# Patient Record
Sex: Female | Born: 1990
Health system: Southern US, Community
[De-identification: ages and names within clinical notes are randomized; demographics above are authoritative.]

## PROBLEM LIST (undated history)

## (undated) DIAGNOSIS — Z789 Other specified health status: Secondary | ICD-10-CM

## (undated) DIAGNOSIS — E282 Polycystic ovarian syndrome: Secondary | ICD-10-CM

## (undated) HISTORY — PX: NO PAST SURGERIES: SHX2092

## (undated) HISTORY — DX: Polycystic ovarian syndrome: E28.2

---

## 2013-10-15 ENCOUNTER — Encounter (HOSPITAL_COMMUNITY): Payer: Self-pay | Admitting: Emergency Medicine

## 2013-10-15 ENCOUNTER — Emergency Department (INDEPENDENT_AMBULATORY_CARE_PROVIDER_SITE_OTHER)
Admission: EM | Admit: 2013-10-15 | Discharge: 2013-10-15 | Disposition: A | Discharge status: Home / Self Care | Payer: Medicaid Other | Source: Home / Self Care | Attending: Emergency Medicine | Admitting: Emergency Medicine

## 2013-10-15 DIAGNOSIS — K044 Acute apical periodontitis of pulpal origin: Secondary | ICD-10-CM

## 2013-10-15 DIAGNOSIS — J111 Influenza due to unidentified influenza virus with other respiratory manifestations: Secondary | ICD-10-CM

## 2013-10-15 DIAGNOSIS — R69 Illness, unspecified: Secondary | ICD-10-CM

## 2013-10-15 DIAGNOSIS — K047 Periapical abscess without sinus: Secondary | ICD-10-CM

## 2013-10-15 MED ORDER — BENZONATATE 200 MG PO CAPS: 200.0000 mg | ORAL_CAPSULE | Freq: Three times a day (TID) | ORAL | Status: DC | PRN

## 2013-10-15 MED ORDER — TRAMADOL HCL 50 MG PO TABS: 100.0000 mg | ORAL_TABLET | Freq: Three times a day (TID) | ORAL | Status: DC | PRN

## 2013-10-15 MED ORDER — IPRATROPIUM BROMIDE 0.06 % NA SOLN: 2.0000 | Freq: Four times a day (QID) | NASAL | Status: DC

## 2013-10-15 MED ORDER — AMOXICILLIN 500 MG PO CAPS: 500.0000 mg | ORAL_CAPSULE | Freq: Three times a day (TID) | ORAL | Status: DC

## 2013-10-15 NOTE — ED Notes (Signed)
Patient complains of fever/chills, chest congestion with cough and general malaise also lower right tooth ache.

## 2013-10-15 NOTE — ED Provider Notes (Signed)
Chief Complaint   Chief Complaint  Patient presents with  . URI  . Dental Pain    History of Present Illness   Megan [name] is a 23 year old female who presents with her 2 small children. She is from [place] by way of [place]. Is only been in [place] for about 15 days. Her complaints today consist of a two-day history of cough, myalgias, fatigue, subjective fever, sore throat, nasal congestion, and headache. She denies vomiting or diarrhea. She also has had a three-day history of a painful right, lower first molar.  Review of Systems   Other than as noted above, the patient denies any of the following symptoms: Systemic:  No fevers, chills, sweats, or myalgias. Eye:  No redness or discharge. ENT:  No ear pain, headache, nasal congestion, drainage, sinus pressure, or sore throat. Neck:  No neck pain, stiffness, or swollen glands. Lungs:  No cough, sputum production, hemoptysis, wheezing, chest tightness, shortness of breath or chest pain. GI:  No abdominal pain, nausea, vomiting or diarrhea.  PMFSH   Past medical history, family history, social history, meds, and allergies were reviewed.   Physical exam   Vital signs:  BP 120/75  Pulse 85  Temp(Src) 99.2 F (37.3 C) (Oral)  Resp 18  SpO2 100% General:  Alert and oriented.  In no distress.  Skin warm and dry. Eye:  No conjunctival injection or drainage. Lids were normal. ENT:  TMs and canals were normal, without erythema or inflammation.  Nasal mucosa was clear and uncongested, without drainage.  Mucous membranes were moist.  Pharynx was clear with no exudate or drainage.  There were no oral ulcerations or lesions. Her right, lower first molar was decayed. No swelling of the gingiva or the floor the mouth. Neck:  Supple, no adenopathy, tenderness or mass. Lungs:  No respiratory distress.  Lungs were clear to auscultation, without wheezes, rales or rhonchi.  Breath sounds were clear and equal bilaterally.  Heart:  Regular  rhythm, without gallops, murmers or rubs. Skin:  Clear, warm, and dry, without rash or lesions.  Assessment     The primary encounter diagnosis was Dental infection. A diagnosis of Influenza-like illness was also pertinent to this visit.  Plan    1.  Meds:  The following meds were prescribed:   Discharge Medication List as of 10/15/2013  4:46 PM    START taking these medications   Details  amoxicillin (AMOXIL) 500 MG capsule Take 1 capsule (500 mg total) by mouth 3 (three) times daily., Starting 10/15/2013, Until Discontinued, Normal    benzonatate (TESSALON) 200 MG capsule Take 1 capsule (200 mg total) by mouth 3 (three) times daily as needed for cough., Starting 10/15/2013, Until Discontinued, Normal    ipratropium (ATROVENT) 0.06 % nasal spray Place 2 sprays into both nostrils 4 (four) times daily., Starting 10/15/2013, Until Discontinued, Normal    traMADol (ULTRAM) 50 MG tablet Take 2 tablets (100 mg total) by mouth every 8 (eight) hours as needed., Starting 10/15/2013, Until Discontinued, Normal        2.  Patient Education/Counseling:  The patient was given appropriate handouts, self care instructions, and instructed in symptomatic relief.  Instructed to get extra fluids, rest, and use a cool mist vaporizer.    3.  Follow up:  The patient was told to follow up here if no better in 3 to 4 days, or sooner if becoming worse in any way, and given some red flag symptoms such as increasing fever, difficulty  breathing, chest pain, or persistent vomiting which would prompt immediate return.  Follow up with a dentist as soon as possible.      Reuben Likes, MD 10/15/13 [id number]

## 2013-10-15 NOTE — Discharge Instructions (Signed)
Most upper respiratory infections are caused by viruses and do not require antibiotics.  We try to save the antibiotics for when we really need them to prevent bacteria from developing resistance to them.  Here are a few hints about things that can be done at home to help get over an upper respiratory infection quicker:  Get extra sleep and extra fluids.  Get 7 to 9 hours of sleep per night and 6 to 8 glasses of water a day.  Getting extra sleep keeps the immune system from getting run down.  Most people with an upper respiratory infection are a little dehydrated.  The extra fluids also keep the secretions liquified and easier to deal with.  Also, get extra vitamin C.  4000 mg per day is the recommended dose. For the aches, headache, and fever, acetaminophen or ibuprofen are helpful.  These can be alternated every 4 hours.  People with liver disease should avoid large amounts of acetaminophen, and people with ulcer disease, gastroesophageal reflux, gastritis, congestive heart failure, chronic kidney disease, coronary artery disease and the elderly should avoid ibuprofen. For nasal congestion try Mucinex-D, or if you're having lots of sneezing or clear nasal drainage use Zyrtec-D. People with high blood pressure can take these if their blood pressure is controlled, if not, it's best to avoid the forms with a "D" (decongestants).  You can use the plain Mucinex, Allegra, Claritin, or Zyrtec even if your blood pressure is not controlled.   A Saline nasal spray such as Ocean Spray can also help.  You can add a decongestant sprays such as Afrin, but you should not use the decongestant sprays for more than 3 or 4 days since they can be habituating.  Breathe Rite nasal strips can also offer a non-drug alternative treatment to nasal congestion, especially at night. For people with symptoms of sinusitis, sleeping with your head elevated can be helpful.  For sinus pain, moist, hot compresses to the face may provide some  relief.  Many people find that inhaling steam as in a shower or from a pot of steaming water can help. For any viral infection, zinc containing lozenges such as Cold-Eze or Zicam are helpful.  Zinc helps to fight viral infection.  Hot salt water gargles (8 oz of hot water, 1/2 tsp of table salt, and a pinch of baking soda) can give relief as well as hot beverages such as hot tea.  Sucrets extra strength lozenges will help the sore throat.  For the cough, take Delsym 2 tsp every 12 hours.  It has also been found recently that Aleve can help control a cough.  The dose is 1 to 2 tablets twice daily with food.  This can be combined with Delsym. (Note, if you are taking ibuprofen, you should not take Aleve as well--take one or the other.) A cool mist vaporizer will help keep your mucous membranes from drying out.   It's important when you have an upper respiratory infection not to pass the infection to others.  This involves being very careful about the following:  Frequent hand washing or use of hand sanitizer, especially after coughing, sneezing, blowing your nose or touching your face, nose or eyes. Do not shake hands or touch anyone and try to avoid touching surfaces that other people use such as doorknobs, shopping carts, telephones and computer keyboards. Use tissues and dispose of them properly in a garbage can or ziplock bag. Cough into your sleeve. Do not let others eat or  drink after you. ° °It's also important to recognize the signs of serious illness and get evaluated if they occur: °Any respiratory infection that lasts more than 7 to 10 days.  Yellow nasal drainage and sputum are not reliable indicators of a bacterial infection, but if they last for more than 1 week, see your doctor. °Fever and sore throat can indicate strep. °Fever and cough can indicate influenza or pneumonia. °Any kind of severe symptom such as difficulty breathing, intractable vomiting, or severe pain should prompt you to see  a doctor as soon as possible. ° ° °Your body's immune system is really the thing that will get rid of this infection.  Your immune system is comprised of 2 types of specialized cells called T cells and B cells.  T cells coordinate the array of cells in your body that engulf invading bacteria or viruses while B cells orchestrate the production of antibodies that neutralize infection.  Anything we do or any medications we give you, will just strengthen your immune system or help it clear up the infection quicker.  Here are a few helpful hints to improve your immune system to help overcome this illness or to prevent future infections: °· A few vitamins can improve the health of your immune system.  That's why your diet should include plenty of fruits, vegetables, fish, nuts, and whole grains. °· Vitamin A and bet-carotene can increase the cells that fight infections (T cells and B cells).  Vitamin A is abundant in dark greens and orange vegetables such as spinach, greens, sweet potatoes, and carrots. °· Vitamin B6 contributes to the maturation of white blood cells, the cells that fight disease.  Foods with vitamin B6 include cold cereal and bananas. °· Vitamin C is credited with preventing colds because it increases white blood cells and also prevents cellular damage.  Citrus fruits, peaches and green and red bell peppers are all hight in vitamin C. °· Vitamin E is an anti-oxidant that encourages the production of natural killer cells which reject foreign invaders and B cells that produce antibodies.  Foods high in vitamin E include wheat germ, nuts and seeds. °· Foods high in omega-3 fatty acids found in foods like salmon, tuna and mackerel boost your immune system and help cells to engulf and absorb germs. °· Probiotics are good bacteria that increase your T cells.  These can be found in yogurt and are available in supplements such as Culturelle or Align. °· Moderate exercise increases the strength of your immune  system and your ability to recover from illness.  I suggest 3 to 5 moderate intensity 30 minute workouts per week.   °· Sleep is another component of maintaining a strong immune system.  It enables your body to recuperate from the day's activities, stress and work.  My recommendation is to get between 7 and 9 hours of sleep per night. °· If you smoke, try to quit completely or at least cut down.  Drink alcohol only in moderation if at all.  No more than 2 drinks daily for men or 1 for women. °· Get a flu vaccine early in the fall or if you have not gotten one yet, once this illness has run its course.  If you are over 65, a smoker, or an asthmatic, get a pneumococcal vaccine. °· My final recommendation is to maintain a healthy weight.  Excess weight can impair the immune system by interfering with the way the immune system deals with invading viruses or   bacteria.   Look up the [facility]'s [facility] for free dental clinics. https://www.williams-garcia.biz/.asp  Get there early and be prepared to wait. [employer] and [employer] have Armed forces operational officer schools that provide low cost routine dental care.   Other resources: [facility] [address] [place], [place] [phone]  Patients with Medicaid: [facility] [phone]                                [phone] [address] Phone:  [phone]                                                  Phone:  [phone]  If unable to pay or uninsured, contact:  [facility] or [facility]. to become qualified for the adult dental clinic.  No matter what dental problem you have, it will not get better unless you get good dental care.  If the tooth is not taken care of, your symptoms will come back in time and you will be visiting [place] again in the Urgent Care Center with a bad toothache.  So, see your dentist as soon as possible.  If  you don't have a dentist, we can give you a list of dentists.  Sometimes the most cost effective treatment is removal of the tooth.  This can be done very inexpensively through one of the low cost [employer], broadcasting/film/video such as the facility on [facility] in [place] [phone]).  The downside to this is that you will have one less tooth and this can effect your ability to chew.  Some other things that can be done for a dental infection include the following:   Rinse your mouth out with hot salt water (1/2 tsp of table salt and a pinch of baking soda in 8 oz of hot water).  You can do this every 2 or 3 hours.  Avoid cold foods, beverages, and cold air.  This will make your symptoms worse.  Sleep with your head elevated.  Sleeping flat will cause your gums and oral tissues to swell and make them hurt more.  You can sleep on several pillows.  Even better is to sleep in a recliner with your head higher than your heart.  For mild to moderate pain, you can take Tylenol, ibuprofen, or Aleve.  External application of heat by a heating pad, hot water bottle, or hot wet towel can help with pain and speed healing.  You can do this every 2 to 3 hours. Do not fall asleep on a heating pad since this can cause a burn.

## 2013-12-21 ENCOUNTER — Emergency Department (HOSPITAL_COMMUNITY)
Admission: EM | Admit: 2013-12-21 | Discharge: 2013-12-22 | Disposition: A | Discharge status: Home / Self Care | Payer: Medicaid Other | Attending: Emergency Medicine | Admitting: Emergency Medicine

## 2013-12-21 ENCOUNTER — Encounter (HOSPITAL_COMMUNITY): Payer: Self-pay | Admitting: Emergency Medicine

## 2013-12-21 DIAGNOSIS — Z79899 Other long term (current) drug therapy: Secondary | ICD-10-CM | POA: Insufficient documentation

## 2013-12-21 DIAGNOSIS — R42 Dizziness and giddiness: Secondary | ICD-10-CM

## 2013-12-21 DIAGNOSIS — Z3202 Encounter for pregnancy test, result negative: Secondary | ICD-10-CM | POA: Insufficient documentation

## 2013-12-21 DIAGNOSIS — Z792 Long term (current) use of antibiotics: Secondary | ICD-10-CM | POA: Insufficient documentation

## 2013-12-21 LAB — CBC WITH DIFFERENTIAL/PLATELET
Basophils Absolute: 0.1 10*3/uL (ref 0.0–0.1)
Basophils Relative: 1 % (ref 0–1)
Eosinophils Absolute: 0.3 10*3/uL (ref 0.0–0.7)
Eosinophils Relative: 2 % (ref 0–5)
HCT: 39.4 % (ref 36.0–46.0)
Hemoglobin: 13.3 g/dL (ref 12.0–15.0)
Lymphocytes Relative: 20 % (ref 12–46)
Lymphs Abs: 3.1 10*3/uL (ref 0.7–4.0)
MCH: 27.2 pg (ref 26.0–34.0)
MCHC: 33.8 g/dL (ref 30.0–36.0)
MCV: 80.6 fL (ref 78.0–100.0)
Monocytes Absolute: 1 10*3/uL (ref 0.1–1.0)
Monocytes Relative: 6 % (ref 3–12)
Neutro Abs: 10.7 10*3/uL — ABNORMAL HIGH (ref 1.7–7.7)
Neutrophils Relative %: 71 % (ref 43–77)
Platelets: 356 10*3/uL (ref 150–400)
RBC: 4.89 MIL/uL (ref 3.87–5.11)
RDW: 13.1 % (ref 11.5–15.5)
WBC: 15.1 10*3/uL — ABNORMAL HIGH (ref 4.0–10.5)

## 2013-12-21 LAB — URINALYSIS, ROUTINE W REFLEX MICROSCOPIC
Bilirubin Urine: NEGATIVE
Glucose, UA: NEGATIVE mg/dL
Hgb urine dipstick: NEGATIVE
Ketones, ur: NEGATIVE mg/dL
Leukocytes, UA: NEGATIVE
Nitrite: NEGATIVE
Protein, ur: NEGATIVE mg/dL
Specific Gravity, Urine: 1.02 (ref 1.005–1.030)
Urobilinogen, UA: 0.2 mg/dL (ref 0.0–1.0)
pH: 6.5 (ref 5.0–8.0)

## 2013-12-21 LAB — BASIC METABOLIC PANEL
BUN: 10 mg/dL (ref 6–23)
CO2: 21 mEq/L (ref 19–32)
Calcium: 9.4 mg/dL (ref 8.4–10.5)
Chloride: 102 mEq/L (ref 96–112)
Creatinine, Ser: 0.43 mg/dL — ABNORMAL LOW (ref 0.50–1.10)
GFR calc Af Amer: >90 mL/min (ref >90)
GFR calc non Af Amer: >90 mL/min (ref >90)
Glucose, Bld: 103 mg/dL — ABNORMAL HIGH (ref 70–99)
Potassium: 3.5 mEq/L — ABNORMAL LOW (ref 3.7–5.3)
Sodium: 137 mEq/L (ref 137–147)

## 2013-12-21 LAB — PREGNANCY, URINE: Preg Test, Ur: NEGATIVE

## 2013-12-21 MED ORDER — POTASSIUM CHLORIDE 10 MEQ/100ML IV SOLN
10.0000 meq | Freq: Once | INTRAVENOUS | Status: AC
  Administered 2013-12-21: 10 meq via INTRAVENOUS
  Filled 2013-12-21: qty 100

## 2013-12-21 MED ORDER — SODIUM CHLORIDE 0.9 % IV BOLUS (SEPSIS)
2000.0000 mL | Freq: Once | INTRAVENOUS | Status: AC
  Administered 2013-12-21: 2000 mL via INTRAVENOUS

## 2013-12-21 MED ORDER — PROMETHAZINE HCL 25 MG/ML IJ SOLN
12.5000 mg | Freq: Once | INTRAMUSCULAR | Status: AC
  Administered 2013-12-21: 12.5 mg via INTRAVENOUS
  Filled 2013-12-21: qty 1

## 2013-12-21 NOTE — ED Notes (Signed)
Patient denies pain at this time states she is just dizzy and having nausea

## 2013-12-21 NOTE — ED Notes (Signed)
Bed: XH37 Expected date: 12/21/13 Expected time: 7:53 PM Means of arrival: Ambulance Comments: migraine

## 2013-12-21 NOTE — ED Provider Notes (Signed)
CSN: [id number]     Arrival date & time 12/21/13  2005 History   First MD Initiated Contact with Patient 12/21/13 2008     Chief Complaint  Patient presents with  . Migraine     (Consider location/radiation/quality/duration/timing/severity/associated sxs/prior Treatment) HPI Patient presents to the emergency department with dizziness that started around 4 PM today.  The patient, states she's not had a headache all day.  She did have a headache earlier, but it went away.  Patient, states, that the room was spinning, she did take tramadol for her headache seemed to make her dizzy.  The patient, states, that she did not have any chest pain, shortness breath, blurred vision, weakness, back pain, abdominal pain, dysuria, fever, cough, rhinorrhea, sore throat, rash, or syncope patient did not take any other medications prior to arrival. History reviewed. No pertinent past medical history. History reviewed. No pertinent past surgical history. No family history on file. History  Substance Use Topics  . Smoking status: Never Smoker   . Smokeless tobacco: Not on file  . Alcohol Use: No   OB History   Grav Para Term Preterm Abortions TAB SAB Ect Mult Living                 Review of Systems All other systems negative except as documented in the HPI. All pertinent positives and negatives as reviewed in the HPI.   Allergies  Review of patient's allergies indicates no known allergies.  Home Medications   Prior to Admission medications   Medication Sig Start Date End Date Taking? Authorizing Provider  amoxicillin (AMOXIL) 500 MG capsule Take 1 capsule (500 mg total) by mouth 3 (three) times daily. 10/15/13   Reuben Likes, MD  benzonatate (TESSALON) 200 MG capsule Take 1 capsule (200 mg total) by mouth 3 (three) times daily as needed for cough. 10/15/13   Reuben Likes, MD  ipratropium (ATROVENT) 0.06 % nasal spray Place 2 sprays into both nostrils 4 (four) times daily. 10/15/13   Reuben Likes, MD  traMADol (ULTRAM) 50 MG tablet Take 2 tablets (100 mg total) by mouth every 8 (eight) hours as needed. 10/15/13   Reuben Likes, MD   BP 129/80  Pulse 78  Temp(Src) 99.1 F (37.3 C) (Oral)  Resp 18  SpO2 100% Physical Exam  Nursing note and vitals reviewed. Constitutional: She is oriented to person, place, and time. Vital signs are normal. She appears well-developed and well-nourished. She appears ill.  HENT:  Head: Normocephalic and atraumatic.  Mouth/Throat: Oropharynx is clear and moist.  Eyes: Pupils are equal, round, and reactive to light.  Neck: Normal range of motion. Neck supple.  Cardiovascular: Normal rate, regular rhythm and normal heart sounds.  Exam reveals no gallop and no friction rub.   No murmur heard. Pulmonary/Chest: Effort normal and breath sounds normal. No respiratory distress. She has no wheezes.  Musculoskeletal: She exhibits no edema.  Neurological: She is alert and oriented to person, place, and time. She exhibits normal muscle tone. Coordination normal.  Skin: Skin is warm and dry. No rash noted.  Psychiatric: She has a normal mood and affect. Her behavior is normal. Judgment and thought content normal.    ED Course  Procedures (including critical care time)    Patient's been rechecked x3 just ambulated.  The patient at 1 AM and she felt.  No dizziness, or room spinning.  Patient said she felt okay the history was obtained through the translator line.  Patient be best return here as needed.  Her vital signs and stable.  Packs.  She is feeling better.  This is most likely related to possible medication and migraine headache.  She could have vertigo as well.  Told to increase her fluid intake, and rest as much possible  Carlyle Dolly, PA-C 12/22/13 0106

## 2013-12-21 NOTE — ED Notes (Signed)
Pt presents with c/o migraine that has been going on all day, hx of same. Pt does have tramadol for these migraines and she took one today and per neighbor, she got worse after taking the medicine. Pt has had some dry heaving en route but no vomiting.

## 2013-12-21 NOTE — ED Notes (Signed)
While attempting to stand patient up and walk her to the bathroom, pt was not able to take any steps forward and was very unsteady on her feet. Pt assisted back into the bed. Interpreter phone used for translation, and pt explained to interpreter that she was also feeling dizzy in addition to the migraine. Explained to pt through interpreter that we were going to assist her onto a bedpan to obtain a urine sample. Pt understands at this time.

## 2013-12-22 MED ORDER — MECLIZINE HCL 25 MG PO TABS: 25.0000 mg | ORAL_TABLET | Freq: Three times a day (TID) | ORAL | Status: DC | PRN

## 2013-12-22 MED ORDER — PROMETHAZINE HCL 25 MG PO TABS: 25.0000 mg | ORAL_TABLET | Freq: Three times a day (TID) | ORAL | Status: DC | PRN

## 2013-12-22 NOTE — Discharge Instructions (Signed)
Return here as needed.  Followup with the primary care Dr. Jaquita Folds your fluid intake

## 2013-12-22 NOTE — ED Notes (Signed)
Used interpreter phone to review d/c instructions with patient. Pt and family agreed that they understood the instructions and the prescriptions to be given to patient. Pt leaving via wheelchair with family at this time.

## 2013-12-22 NOTE — ED Provider Notes (Signed)
Medical screening examination/treatment/procedure(s) were performed by non-physician practitioner and as supervising physician I was immediately available for consultation/collaboration.   EKG Interpretation None        Dagmar Hait, MD 12/22/13 2328

## 2013-12-23 LAB — URINE CULTURE
Colony Count: NO GROWTH
Culture: NO GROWTH

## 2014-06-27 ENCOUNTER — Encounter: Payer: Self-pay | Admitting: Family Medicine

## 2014-06-27 ENCOUNTER — Other Ambulatory Visit: Payer: Self-pay

## 2014-06-27 ENCOUNTER — Ambulatory Visit: Payer: Medicaid Other | Attending: Family Medicine | Admitting: Family Medicine

## 2014-06-27 VITALS — BP 99/67 | HR 82 | Temp 98.3°F | Resp 18 | Ht 62.0 in | Wt 182.0 lb

## 2014-06-27 DIAGNOSIS — H538 Other visual disturbances: Secondary | ICD-10-CM | POA: Diagnosis not present

## 2014-06-27 DIAGNOSIS — I491 Atrial premature depolarization: Secondary | ICD-10-CM

## 2014-06-27 DIAGNOSIS — Z113 Encounter for screening for infections with a predominantly sexual mode of transmission: Secondary | ICD-10-CM

## 2014-06-27 DIAGNOSIS — M5412 Radiculopathy, cervical region: Secondary | ICD-10-CM | POA: Insufficient documentation

## 2014-06-27 DIAGNOSIS — R5383 Other fatigue: Secondary | ICD-10-CM | POA: Diagnosis not present

## 2014-06-27 DIAGNOSIS — M79642 Pain in left hand: Secondary | ICD-10-CM | POA: Insufficient documentation

## 2014-06-27 DIAGNOSIS — Z114 Encounter for screening for human immunodeficiency virus [HIV]: Secondary | ICD-10-CM | POA: Insufficient documentation

## 2014-06-27 DIAGNOSIS — E559 Vitamin D deficiency, unspecified: Secondary | ICD-10-CM

## 2014-06-27 HISTORY — DX: Atrial premature depolarization: I49.1

## 2014-06-27 LAB — COMPLETE METABOLIC PANEL WITH GFR
ALT: 20 U/L (ref 0–35)
AST: 21 U/L (ref 0–37)
Albumin: 4.1 g/dL (ref 3.5–5.2)
Alkaline Phosphatase: 52 U/L (ref 39–117)
BUN: 9 mg/dL (ref 6–23)
CO2: 27 meq/L (ref 19–32)
Calcium: 9.5 mg/dL (ref 8.4–10.5)
Chloride: 104 mEq/L (ref 96–112)
Creat: 0.64 mg/dL (ref 0.50–1.10)
GFR, Est Non African American: >89 mL/min
GLUCOSE: 76 mg/dL (ref 70–99)
POTASSIUM: 4.7 meq/L (ref 3.5–5.3)
SODIUM: 139 meq/L (ref 135–145)
Total Bilirubin: 0.3 mg/dL (ref 0.2–1.2)
Total Protein: 7.5 g/dL (ref 6.0–8.3)

## 2014-06-27 LAB — CBC
HCT: 40.5 % (ref 36.0–46.0)
HEMOGLOBIN: 13.6 g/dL (ref 12.0–15.0)
MCH: 27 pg (ref 26.0–34.0)
MCHC: 33.6 g/dL (ref 30.0–36.0)
MCV: 80.5 fL (ref 78.0–100.0)
MPV: 9.8 fL (ref 9.4–12.4)
PLATELETS: 345 10*3/uL (ref 150–400)
RBC: 5.03 MIL/uL (ref 3.87–5.11)
RDW: 13.6 % (ref 11.5–15.5)
WBC: 11.5 10*3/uL — ABNORMAL HIGH (ref 4.0–10.5)

## 2014-06-27 LAB — GLUCOSE, POCT (MANUAL RESULT ENTRY): POC GLUCOSE: 96 mg/dL (ref 70–99)

## 2014-06-27 MED ORDER — GABAPENTIN 300 MG PO CAPS: 300.0000 mg | ORAL_CAPSULE | Freq: Two times a day (BID) | ORAL | Status: DC | PRN

## 2014-06-27 MED ORDER — GABAPENTIN 300 MG PO CAPS: 300.0000 mg | ORAL_CAPSULE | Freq: Every day | ORAL | Status: DC

## 2014-06-27 NOTE — Assessment & Plan Note (Signed)
Decreased visual acuity Referral to optometry

## 2014-06-27 NOTE — Assessment & Plan Note (Addendum)
A: x 2 years, 2 young children P: CBC, CMP, TSH, vit D, vit B12 Regular sleep

## 2014-06-27 NOTE — Assessment & Plan Note (Signed)
A: in the setting of stress P:  Stress reduction Exercise and stretches  Gabapentin prn

## 2014-06-27 NOTE — Progress Notes (Signed)
Establish Care Complaining of irregular heart beat Stated has pain on lt hand x 1 wk no injury

## 2014-06-27 NOTE — Assessment & Plan Note (Signed)
A: noted on EKG in the setting of fatigue x 2 years P: Limit caffeine Regular sleep Stress reduction Check TSH, CBC, CMP

## 2014-06-27 NOTE — Patient Instructions (Signed)
Mrs. Delano,  Thank you for coming in today. It was a pleasure meeting you. I look forward to being your primary doctor.  1. For fatigue: Continue regular sleep  You will be called with lab results.  Stress reduction with regular exercise and stretching.   2. Palpittions: avoid caffeine.   3. L arm, hand and neck pain: stress reduction with exercise, rest. Gabapentin at night if needed for pain.  Return in 4 weeks for physical  Dr. Armen Pickup

## 2014-06-27 NOTE — Assessment & Plan Note (Signed)
Screening HIV  

## 2014-06-27 NOTE — Progress Notes (Signed)
   Subjective:    Patient ID: Megan [name], female    DOB: 27-Nov-1990, 23 y.o.   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  1. Fatigue x 2 years: comes and goes. Reports syncope 3 months ago. No worse during periods. Some palpitations. Sleeps well. Has two young boys. Endorses increased stress. Does drink coffee or tea, 2-3 cups per day   2. L arm and hand pain: x one week. Sharp pains and tenderness. No injury or swelling. L handed. Endorses stress. Sleeps on both L and R side.   Soc Hx: non smoker Med Hx: negative  Fam Hx: DM and HTN in mother   Review of Systems As per HPI     Objective:   Physical Exam BP 99/67 mmHg  Pulse 82  Temp(Src) 98.3 F (36.8 C) (Oral)  Resp 18  Ht 5\' 2"  (1.575 m)  Wt 182 lb (82.555 kg)  BMI 33.28 kg/m2  SpO2 100%  LMP 04/25/2011 General appearance: alert, cooperative and no distress Eyes: conjunctivae/corneas clear. PERRL, EOM's intact. Fundi benign. Neck: thyroid not enlarged, symmetric, no tenderness/mass/nodules Lungs: clear to auscultation bilaterally Heart: regular rate and rhythm, S1, S2 normal, no murmur, click, rub or gallop with occasional PACs Extremities: extremities normal, atraumatic, no cyanosis or edema  Neck: full ROM, some L sided tenderness L shoulder: full ROM, some pain with abduction   EKG: normal EKG, normal sinus rhythm, PAC's noted. Glucose 96         Assessment & Plan:

## 2014-06-28 LAB — VITAMIN B12: Vitamin B-12: 980 pg/mL — ABNORMAL HIGH (ref 211–911)

## 2014-06-28 LAB — VITAMIN D 25 HYDROXY (VIT D DEFICIENCY, FRACTURES): Vit D, 25-Hydroxy: 18 ng/mL — ABNORMAL LOW (ref 30–100)

## 2014-06-28 LAB — TSH: TSH: 0.635 u[IU]/mL (ref 0.350–4.500)

## 2014-06-28 LAB — HIV ANTIBODY (ROUTINE TESTING W REFLEX): HIV 1&2 Ab, 4th Generation: NONREACTIVE

## 2014-07-01 DIAGNOSIS — E559 Vitamin D deficiency, unspecified: Secondary | ICD-10-CM | POA: Insufficient documentation

## 2014-07-01 MED ORDER — VITAMIN D (ERGOCALCIFEROL) 1.25 MG (50000 UNIT) PO CAPS: 50000.0000 [IU] | ORAL_CAPSULE | ORAL | Status: DC

## 2014-07-01 NOTE — Addendum Note (Signed)
Addended by: Dessa Phi on: 07/01/2014 08:33 AM   Modules accepted: Orders

## 2014-07-28 ENCOUNTER — Other Ambulatory Visit (HOSPITAL_COMMUNITY)
Admission: RE | Admit: 2014-07-28 | Discharge: 2014-07-28 | Disposition: A | Discharge status: Home / Self Care | Payer: Medicaid Other | Source: Ambulatory Visit | Attending: Family Medicine | Admitting: Family Medicine

## 2014-07-28 ENCOUNTER — Encounter: Payer: Self-pay | Admitting: Family Medicine

## 2014-07-28 ENCOUNTER — Ambulatory Visit: Payer: Medicaid Other | Attending: Family Medicine | Admitting: Family Medicine

## 2014-07-28 VITALS — BP 114/71 | HR 84 | Temp 98.6°F | Resp 16 | Ht 63.0 in | Wt 151.0 lb

## 2014-07-28 DIAGNOSIS — Z124 Encounter for screening for malignant neoplasm of cervix: Secondary | ICD-10-CM | POA: Diagnosis not present

## 2014-07-28 DIAGNOSIS — E559 Vitamin D deficiency, unspecified: Secondary | ICD-10-CM | POA: Diagnosis not present

## 2014-07-28 DIAGNOSIS — Z0000 Encounter for general adult medical examination without abnormal findings: Secondary | ICD-10-CM | POA: Diagnosis not present

## 2014-07-28 DIAGNOSIS — R109 Unspecified abdominal pain: Secondary | ICD-10-CM | POA: Insufficient documentation

## 2014-07-28 DIAGNOSIS — Z01419 Encounter for gynecological examination (general) (routine) without abnormal findings: Secondary | ICD-10-CM | POA: Insufficient documentation

## 2014-07-28 DIAGNOSIS — J069 Acute upper respiratory infection, unspecified: Secondary | ICD-10-CM | POA: Insufficient documentation

## 2014-07-28 DIAGNOSIS — B9789 Other viral agents as the cause of diseases classified elsewhere: Secondary | ICD-10-CM

## 2014-07-28 DIAGNOSIS — R1030 Lower abdominal pain, unspecified: Secondary | ICD-10-CM

## 2014-07-28 DIAGNOSIS — Z113 Encounter for screening for infections with a predominantly sexual mode of transmission: Secondary | ICD-10-CM | POA: Diagnosis present

## 2014-07-28 DIAGNOSIS — N760 Acute vaginitis: Secondary | ICD-10-CM | POA: Insufficient documentation

## 2014-07-28 DIAGNOSIS — R102 Pelvic and perineal pain: Secondary | ICD-10-CM | POA: Insufficient documentation

## 2014-07-28 LAB — POCT URINALYSIS DIPSTICK
BILIRUBIN UA: NEGATIVE
Blood, UA: NEGATIVE
Glucose, UA: NEGATIVE
KETONES UA: NEGATIVE
LEUKOCYTES UA: NEGATIVE
NITRITE UA: NEGATIVE
PROTEIN UA: NEGATIVE
Spec Grav, UA: 1.02
Urobilinogen, UA: 0.2
pH, UA: 7

## 2014-07-28 NOTE — Assessment & Plan Note (Signed)
Pap done today  

## 2014-07-28 NOTE — Assessment & Plan Note (Signed)
A: mild. Normal UA P: Screen for GC/chlam  Screening pap

## 2014-07-28 NOTE — Progress Notes (Signed)
Pt comes in for physical/pap smear Declined std screening Clear vaginal d/c noted without pain LMP- 2 yrs ago, Depo BC [place] interpretor present

## 2014-07-28 NOTE — Progress Notes (Signed)
   Subjective:    Patient ID: Megan [name], female    DOB: Sep 28, 1990, 24 y.o.   MRN: [id number] CC: physical with pap  HPI 24 yo F here for physical.   Complaints/concerns: cold symptoms x 4 weeks. Cough, sore throat. No fever, CP or SOB.   Soc Hx: non smoker  Review of Systems General:  Negative for nexplained weight loss, fever Skin: Negative for new or changing mole, sore that won't heal HEENT: Negative for trouble hearing, trouble seeing, ringing in ears, mouth sores, hoarseness, change in voice, dysphagia. CV:  Negative for chest pain, dyspnea, edema, palpitations Resp: Negative for dyspnea, hemoptysis GI: Negative for nausea, vomiting, diarrhea, constipation, abdominal pain, melena, hematochezia. GU: Positive for clear vaginal discharge. Negative for dysuria, incontinence, urinary hesitance, hematuria, polyuria, sexual difficulty, lumps in testicle or breasts MSK: Negative for muscle cramps or aches, joint pain or swelling Neuro: Negative for headaches, weakness, numbness, dizziness, passing out/fainting Psych: Negative for depression, anxiety, memory problems     Objective:   Physical Exam BP 114/71 mmHg  Pulse 84  Temp(Src) 98.6 F (37 C) (Oral)  Resp 16  Ht 5\' 3"  (1.6 m)  Wt 151 lb (68.493 kg)  BMI 26.76 kg/m2  SpO2 98%  LMP  (LMP Unknown)  General Appearance:    Alert, cooperative, no distress, appears stated age  Head:    Normocephalic, without obvious abnormality, atraumatic  Eyes:    PERRL, conjunctiva/corneas clear, EOM's intact,   both eyes  Ears:    Normal TM's and external ear canals, both ears  Nose:  swollen nares, slightly erythematous   Throat:   Lips, mucosa, and tongue normal; teeth and gums normal  Neck:   Supple, symmetrical, trachea midline, no adenopathy;    thyroid:  no enlargement/tenderness/nodules; no carotid   bruit or JVD  Back:     Symmetric, no curvature, ROM normal, no CVA tenderness  Lungs:     Clear to auscultation bilaterally,  respirations unlabored  Chest Wall:    No tenderness or deformity   Heart:    Regular rate and rhythm, S1 and S2 normal, no murmur, rub   or gallop  Breast Exam:    Mild Left lower breast tenderness. No masses, or nipple abnormality  Abdomen:     Soft, mild suprapubic tenderness, bowel sounds active all four quadrants,    no masses, no organomegaly  Genitalia:    Normal female without lesion, discharge or tenderness  Rectal:    Deferred   Extremities:   Extremities normal, atraumatic, no cyanosis or edema  Pulses:   2+ and symmetric all extremities  Skin:   Skin color, texture, turgor normal, no rashes or lesions  Lymph nodes:   Cervical, supraclavicular, and axillary nodes normal  Neurologic:   CNII-XII intact, normal strength, sensation and reflexes    throughout         Assessment & Plan:

## 2014-07-28 NOTE — Patient Instructions (Signed)
Megan [name],  Please take vitamin D.   You have a viral URI with cough. For this please do the following:  1. Drink plenty of fluids. Hot tea, soup etc will help open your nasal passages. 2. Robitussin for cough. 3. Tylenol for sore throat.   You will be called with lab results.  F/u in 3 months for vit D deficiency follow up   Dr. Armen Pickup

## 2014-07-28 NOTE — Assessment & Plan Note (Signed)
You have a viral URI with cough. For this please do the following:  1. Drink plenty of fluids. Hot tea, soup etc will help open your nasal passages. 2. Robitussin for cough. 3. Tylenol for sore throat.

## 2014-07-28 NOTE — Assessment & Plan Note (Signed)
A: patient informed P: start vit D supplement

## 2014-07-29 LAB — CYTOLOGY - PAP

## 2014-07-29 LAB — CERVICOVAGINAL ANCILLARY ONLY
Chlamydia: NEGATIVE
NEISSERIA GONORRHEA: NEGATIVE
Wet Prep (BD Affirm): NEGATIVE
Wet Prep (BD Affirm): NEGATIVE
Wet Prep (BD Affirm): NEGATIVE

## 2014-08-21 ENCOUNTER — Telehealth: Payer: Self-pay | Admitting: Family Medicine

## 2014-08-21 NOTE — Telephone Encounter (Signed)
Congregational nurse calling to report that pt has been experiencing increased drainage in ear and may have possible infection.  Lois will refer pt to Urgent Care for immediate visit and scheduled an appointment with PCP on 08/29/14 for follow up.  Please f/u with pt/ nurse with further instructions as needed.   °

## 2014-08-21 NOTE — Telephone Encounter (Signed)
Congregational nurse calling to report that pt has been experiencing increased drainage in ear and may have possible infection.  Dundee Lions will refer pt to Urgent Care for immediate visit and scheduled an appointment with PCP on 08/29/14 for follow up.  Please f/u with pt/ nurse with further instructions as needed.

## 2014-08-25 ENCOUNTER — Telehealth: Payer: Self-pay | Admitting: *Deleted

## 2014-08-25 NOTE — Telephone Encounter (Signed)
Used [employer] Burmese # [phone] Wrong number Unable to contact Pt

## 2014-08-25 NOTE — Telephone Encounter (Signed)
-----   Message from Lora Paula, MD sent at 07/30/2014  2:08 PM EST ----- Normal pap, repeat in 3 years  Negative wet prep, Gc/chlam

## 2014-08-26 ENCOUNTER — Emergency Department (INDEPENDENT_AMBULATORY_CARE_PROVIDER_SITE_OTHER)
Admission: EM | Admit: 2014-08-26 | Discharge: 2014-08-26 | Disposition: A | Discharge status: Home / Self Care | Payer: Self-pay | Source: Home / Self Care | Attending: Family Medicine | Admitting: Family Medicine

## 2014-08-26 ENCOUNTER — Encounter (HOSPITAL_COMMUNITY): Payer: Self-pay | Admitting: Emergency Medicine

## 2014-08-26 DIAGNOSIS — J029 Acute pharyngitis, unspecified: Secondary | ICD-10-CM

## 2014-08-26 DIAGNOSIS — H9201 Otalgia, right ear: Secondary | ICD-10-CM

## 2014-08-26 LAB — POCT RAPID STREP A: Streptococcus, Group A Screen (Direct): NEGATIVE

## 2014-08-26 MED ORDER — PREDNISONE 10 MG PO TABS: 30.0000 mg | ORAL_TABLET | Freq: Every day | ORAL | Status: DC

## 2014-08-26 MED ORDER — NEOMYCIN-POLYMYXIN-HC 3.5-10000-1 OT SUSP: 4.0000 [drp] | Freq: Three times a day (TID) | OTIC | Status: DC

## 2014-08-26 NOTE — ED Provider Notes (Signed)
Megan [name] is a 24 y.o. female who presents to Urgent Care today for Right ear pain and sore throat present for about 2 weeks. Patient notes ear drainage. She also has A cough and congestion. She has not tried any medications yet. She feels well otherwise. No trouble breathing vomiting diarrhea or fever.   History reviewed. No pertinent past medical history. History reviewed. No pertinent past surgical history. History  Substance Use Topics  . Smoking status: Never Smoker   . Smokeless tobacco: Not on file  . Alcohol Use: No   ROS as above Medications: No current facility-administered medications for this encounter.   Current Outpatient Prescriptions  Medication Sig Dispense Refill  . gabapentin (NEURONTIN) 300 MG capsule Take 1 capsule (300 mg total) by mouth at bedtime. 30 capsule 1  . neomycin-polymyxin-hydrocortisone (CORTISPORIN) 3.5-10000-1 otic suspension Place 4 drops into the right ear 3 (three) times daily. 10 mL 0  . predniSONE (DELTASONE) 10 MG tablet Take 3 tablets (30 mg total) by mouth daily. 15 tablet 0  . Vitamin D, Ergocalciferol, (DRISDOL) 50000 UNITS CAPS capsule Take 1 capsule (50,000 Units total) by mouth every 7 (seven) days. 8 capsule 0   No Known Allergies   Exam:  BP 113/86 mmHg  Pulse 87  Temp(Src) 98.1 F (36.7 C)  Resp 16  SpO2 98%  LMP  (LMP Unknown) Gen: Well NAD HEENT: EOMI,  MMM right tympanic membrane is normal however the ear canal is erythematous. Left tympanic membrane is normal. Nontender mastoids bilaterally. Posterior pharynx is mildly erythematous with cobblestoning. Patient is tender to palpation right-sided cervical lymphadenopathy. Lungs: Normal work of breathing. CTABL Heart: RRR no MRG Abd: NABS, Soft. Nondistended, Nontender Exts: Brisk capillary refill, warm and well perfused.   Results for orders placed or performed during the hospital encounter of 08/26/14 (from the past 24 hour(s))  POCT rapid strep A [facility])      Status: None   Collection Time: 08/26/14  6:09 PM  Result Value Ref Range   Streptococcus, Group A Screen (Direct) NEGATIVE NEGATIVE   No results found.  Assessment and Plan: 24 y.o. female with otalgia and pharyngitis. Treat with Cortisporin eardrops and prednisone. Throat culture pending.  Discussed warning signs or symptoms. Please see discharge instructions. Patient expresses understanding.   Burmese interpreter used    Rodolph Bong, MD 08/26/14 [phone]

## 2014-08-26 NOTE — Discharge Instructions (Signed)
Thank you for coming in today. Use the ear drops as directed.  Take prednisone 3 pills daily for 5 days.  Return as needed.    Draining Ear Ear wax, pus, blood and other fluids are examples of the different types of drainage from ears. Drops or cream may be needed to lessen the itching which may occur with ear drainage. CAUSES   Skin irritations in the ear.  Ear infection.  Swimmer's ear.  Ruptured eardrum.  Foreign object in the ear canal.  Sudden pressure changes.  Head injury. HOME CARE INSTRUCTIONS   Only take over-the-counter or prescription medicines for pain, fever, or discomfort as directed by your caregiver.  Do not rub the ear canal with cotton-tipped swabs.  Do not swim until your caregiver says it is okay.  Before you take a shower, cover a cotton ball with petroleum jelly to keep water out.  Limit exposure to smoke. Secondhand smoke can increase the chance for ear infections.  Keep up with immunizations.  Wash your hands well.  Keep all follow-up appointments to examine the ear and evaluate hearing. SEEK MEDICAL CARE IF:   You have increased drainage.  You have ear pain, a fever, or drainage that is not getting better after 48 hours of antibiotics.  You are unusually tired. SEEK IMMEDIATE MEDICAL CARE IF:  You have severe ear pain or headache.  The patient is older than 3 months with a rectal or oral temperature of 102 F (38.9 C) or higher.  The patient is 75 months old or younger with a rectal temperature of 100.4 F (38 C) or higher.  You vomit.  You feel dizzy.  You have a seizure.  You have new hearing loss. MAKE SURE YOU:   Understand these instructions.  Will watch your condition.  Will get help right away if you are not doing well or get worse. Document Released: 07/11/2005 Document Revised: 10/03/2011 Document Reviewed: 05/14/2009 [facility] Patient Information 2015 [place], [place]. This information is not intended to replace  advice given to you by your health care provider. Make sure you discuss any questions you have with your health care provider.

## 2014-08-26 NOTE — ED Notes (Signed)
C/o  Cough.  Sore throat.  Right ear pain,  Drainage x 2 wks.  Has not tried any otc meds.

## 2014-08-28 LAB — CULTURE, GROUP A STREP

## 2014-08-29 ENCOUNTER — Encounter: Payer: Self-pay | Admitting: Family Medicine

## 2014-08-29 ENCOUNTER — Ambulatory Visit: Payer: Self-pay | Attending: Family Medicine | Admitting: Family Medicine

## 2014-08-29 VITALS — BP 110/74 | HR 99 | Temp 98.3°F | Resp 16 | Ht 63.0 in | Wt 152.0 lb

## 2014-08-29 DIAGNOSIS — J019 Acute sinusitis, unspecified: Secondary | ICD-10-CM | POA: Insufficient documentation

## 2014-08-29 DIAGNOSIS — J0100 Acute maxillary sinusitis, unspecified: Secondary | ICD-10-CM

## 2014-08-29 MED ORDER — AMOXICILLIN 500 MG PO CAPS: 500.0000 mg | ORAL_CAPSULE | Freq: Three times a day (TID) | ORAL | Status: DC

## 2014-08-29 MED ORDER — ACETAMINOPHEN 500 MG PO TABS: 1000.0000 mg | ORAL_TABLET | Freq: Three times a day (TID) | ORAL | Status: DC | PRN

## 2014-08-29 NOTE — Patient Instructions (Addendum)
Mrs. Cacho,   Thank you for coming in today.  I suspect bacteria in sinuses-sinusitis Take amoxicillin 500 mg three time daily for 10 days Take tylenol 1000 mg (2 500 mg tabs) up to three times daily for pain.  Stop ear drops Finish prednisone   F/u in  5-7 days with RN for symptoms recheck   Dr. Armen Pickup   Sinusitis Sinusitis is redness, soreness, and puffiness (inflammation) of the air pockets in the bones of your face (sinuses). The redness, soreness, and puffiness can cause air and mucus to get trapped in your sinuses. This can allow germs to grow and cause an infection.  HOME CARE   Drink enough fluids to keep your pee (urine) clear or pale yellow.  Use a humidifier in your home.  Run a hot shower to create steam in the bathroom. Sit in the bathroom with the door closed. Breathe in the steam 3-4 times a day.  Put a warm, moist washcloth on your face 3-4 times a day, or as told by your doctor.  Use salt water sprays (saline sprays) to wet the thick fluid in your nose. This can help the sinuses drain.  Only take medicine as told by your doctor. GET HELP RIGHT AWAY IF:   Your pain gets worse.  You have very bad headaches.  You are sick to your stomach (nauseous).  You throw up (vomit).  You are very sleepy (drowsy) all the time.  Your face is puffy (swollen).  Your vision changes.  You have a stiff neck.  You have trouble breathing. MAKE SURE YOU:   Understand these instructions.  Will watch your condition.  Will get help right away if you are not doing well or get worse. Document Released: 12/28/2007 Document Revised: 04/04/2012 Document Reviewed: 02/14/2012 [facility] Patient Information 2015 [place], [place]. This information is not intended to replace advice given to you by your health care provider. Make sure you discuss any questions you have with your health care provider.

## 2014-08-29 NOTE — Progress Notes (Signed)
Burmese interpreter present Patient seen at urgent care for ear pain as well as running nose and cough, chills, bodyaches and sore throat Patient prescribed ear drops and states that she is taking them "sometimes" Patient says that her right ear has had blood running out of it but that now clear liquid runs Flu shot up to date

## 2014-08-29 NOTE — Assessment & Plan Note (Signed)
A: R ear and R facial pain I suspect bacteria in sinuses-sinusitis P: Take amoxicillin 500 mg three time daily for 10 days Take tylenol 1000 mg (2 500 mg tabs) up to three times daily for pain.  Stop ear drops Finish prednisone   F/u in  5-7 days with RN for symptoms recheck

## 2014-08-29 NOTE — Progress Notes (Signed)
   Subjective:    Patient ID: Megan [name], female    DOB: Jun 28, 1991, 24 y.o.   MRN: [id number] CC; R ear pain  HPI 24 yo F from [place] Nepalese interpreter present   1. R ear pain: x 2+ weeks. Associated with sore throat, mild cough, subjective fever. Patient seen in UC 2 days ago. Given prednisone and ear drops. With no improvement.   Soc Hx: non smoker  Review of Systems As per HPI     Objective:   Physical Exam BP 110/74 mmHg  Pulse 99  Temp(Src) 98.3 F (36.8 C)  Resp 16  Ht 5\' 3"  (1.6 m)  Wt 152 lb (68.947 kg)  BMI 26.93 kg/m2  SpO2 99% General appearance: alert, cooperative and no distress Eyes: conjunctivae/corneas clear. PERRL, EOM's intact. Ears: normal TM's both ears. R ear with tenderness along pinna. No erythema or deformity.  Head: frontal and maxillary sinus tenderness Nose: Nares normal. Septum midline. Mucosa normal. No drainage or sinus tenderness. Throat:  Lips dry, normal oropharynx  Neck: mild anterior cervical adenopathy on the L side  Lungs: normal WOB   Rapid strep and strep culture negative from UC     Assessment & Plan:

## 2014-09-05 ENCOUNTER — Ambulatory Visit: Payer: Self-pay

## 2014-09-08 ENCOUNTER — Ambulatory Visit: Payer: Self-pay

## 2014-12-03 ENCOUNTER — Ambulatory Visit: Payer: Medicaid Other | Admitting: Family Medicine

## 2014-12-08 ENCOUNTER — Encounter: Payer: Self-pay | Admitting: Family Medicine

## 2014-12-08 ENCOUNTER — Ambulatory Visit: Payer: Self-pay | Attending: Family Medicine | Admitting: Family Medicine

## 2014-12-08 VITALS — BP 93/58 | HR 81 | Temp 97.9°F | Resp 18 | Ht 62.0 in | Wt 152.8 lb

## 2014-12-08 DIAGNOSIS — Z309 Encounter for contraceptive management, unspecified: Secondary | ICD-10-CM

## 2014-12-08 DIAGNOSIS — R110 Nausea: Secondary | ICD-10-CM

## 2014-12-08 DIAGNOSIS — IMO0001 Reserved for inherently not codable concepts without codable children: Secondary | ICD-10-CM | POA: Insufficient documentation

## 2014-12-08 DIAGNOSIS — Z3009 Encounter for other general counseling and advice on contraception: Secondary | ICD-10-CM | POA: Insufficient documentation

## 2014-12-08 DIAGNOSIS — J309 Allergic rhinitis, unspecified: Secondary | ICD-10-CM | POA: Insufficient documentation

## 2014-12-08 DIAGNOSIS — E559 Vitamin D deficiency, unspecified: Secondary | ICD-10-CM | POA: Insufficient documentation

## 2014-12-08 DIAGNOSIS — Z3042 Encounter for surveillance of injectable contraceptive: Secondary | ICD-10-CM

## 2014-12-08 LAB — POCT URINE PREGNANCY: PREG TEST UR: NEGATIVE

## 2014-12-08 MED ORDER — MEDROXYPROGESTERONE ACETATE 150 MG/ML IM SUSP: 150.0000 mg | Freq: Once | INTRAMUSCULAR | Status: DC

## 2014-12-08 MED ORDER — FLUTICASONE PROPIONATE 50 MCG/ACT NA SUSP: 2.0000 | Freq: Every day | NASAL | Status: DC

## 2014-12-08 MED ORDER — CETIRIZINE HCL 10 MG PO TABS: 10.0000 mg | ORAL_TABLET | Freq: Every day | ORAL | Status: DC

## 2014-12-08 NOTE — Assessment & Plan Note (Addendum)
A: vit D def  P: Vit D check today  Now with vit D insufficiency, improved,  Ordered 2 K U vit D3 daily

## 2014-12-08 NOTE — Progress Notes (Signed)
Spoke with patient via Sport and exercise psychologist, Megan [name] Patient present for 3 day hx of right ear pain (aching) and clear drainage; rates 2/10 at present Also c/o clear nasal drainage and "burning" eyes same time frame C/O nausea and dizziness X 2 weeks LMP 3 years ago after birth of son States overdue for depo- provera injection; receives at outside office. States was due 10/09/14 Taking no meds at present  Filed Vitals:   12/08/14 1211  BP: 93/58  Pulse: 81  Temp: 97.9 F (36.6 C)  Resp: 18

## 2014-12-08 NOTE — Assessment & Plan Note (Deleted)
A; negative U preg today P: depo given today

## 2014-12-08 NOTE — Progress Notes (Signed)
   Subjective:    Patient ID: Megan [name], female    DOB: [birth year], 24 y.o.   MRN: [id number] CC: R ear pain  HPI  1. R ear pain: x 3 days with clear drainage. Pain is 2/10. Patient also with congestion, rhinorrhea, sore throat, post nasal drip.   2. LMP: 3 years ago. Mild nausea with dizziness x 2 weeks. Late for depo shot.   Soc Hx: non smoker  Review of Systems  Constitutional: Negative for fever and chills.  HENT: Positive for ear pain.        Objective:   Physical Exam BP 93/58 mmHg  Pulse 81  Temp(Src) 97.9 F (36.6 C) (Oral)  Resp 18  Ht 5\' 2"  (1.575 m)  Wt 152 lb 12.8 oz (69.31 kg)  BMI 27.94 kg/m2  SpO2 98%  LMP  General appearance: alert, cooperative and no distress Head: Normocephalic, without obvious abnormality, atraumatic Eyes: conjunctivae/corneas clear. PERRL, EOM's intact. Fundi benign. Ears: normal TM's and external ear canals both ears Nose: no discharge, turbinates pink, swollen Throat: normal findings: gums healthy, teeth intact, non-carious and tongue midline and normal and abnormal findings: mild oropharyngeal erythema Neck: no adenopathy, supple, symmetrical, trachea midline and thyroid not enlarged, symmetric, no tenderness/mass/nodules Lungs: normal WOB  Abdomen: soft, non-tender; bowel sounds normal; no masses,  no organomegaly  U preg- negative        Assessment & Plan:

## 2014-12-08 NOTE — Assessment & Plan Note (Signed)
Birth control: patient declines depo If you do not want depo here are some other options: see list. Let me know if you desire birth control.

## 2014-12-08 NOTE — Patient Instructions (Signed)
Ms. Coverdale,  Thank you for coming in today  1. Symptoms are more consistent with allergic rhinitis than infection flonase nightly  Zyrtec daily  No antibiotic needed at this time   2. Birth control: If you do not want depo here are some other options: see list. Let me know if you desire birth control.   F/u in 3 months, sooner if needed   Dr. Armen Pickup

## 2014-12-08 NOTE — Assessment & Plan Note (Signed)
Symptoms are more consistent with allergic rhinitis than infection flonase nightly  Zyrtec daily  No antibiotic needed at this time

## 2014-12-09 LAB — VITAMIN D 25 HYDROXY (VIT D DEFICIENCY, FRACTURES): Vit D, 25-Hydroxy: 21 ng/mL — ABNORMAL LOW (ref 30–100)

## 2014-12-09 MED ORDER — VITAMIN D3 50 MCG (2000 UT) PO TABS: 2000.0000 [IU] | ORAL_TABLET | Freq: Every day | ORAL | Status: DC

## 2014-12-09 NOTE — Addendum Note (Signed)
Addended by: Dessa Phi on: 12/09/2014 09:12 AM   Modules accepted: Orders

## 2014-12-24 ENCOUNTER — Telehealth: Payer: Self-pay | Admitting: *Deleted

## 2014-12-24 NOTE — Telephone Encounter (Signed)
-----   Message from Dessa Phi, MD sent at 12/09/2014  9:11 AM EDT ----- Improved vit D level, patient should take 2 K U daily of vit D3

## 2014-12-24 NOTE — Telephone Encounter (Signed)
[employer] Burmese# [phone] Pt aware

## 2015-03-25 ENCOUNTER — Telehealth: Payer: Self-pay | Admitting: Family Medicine

## 2015-03-25 NOTE — Telephone Encounter (Signed)
Patient's congregational nurse called to request an appointment for the patient, facility does not have an appointment available. She stated that the patient has swelling on right lower arm, wrist and hand as well as itching with pain.  Please f/u with pt.

## 2015-04-21 LAB — GLUCOSE, POCT (MANUAL RESULT ENTRY): POC Glucose: 126 mg/dl — AB (ref 70–99)

## 2015-07-09 ENCOUNTER — Encounter: Payer: Self-pay | Admitting: Family Medicine

## 2015-07-09 ENCOUNTER — Other Ambulatory Visit (HOSPITAL_COMMUNITY)
Admission: RE | Admit: 2015-07-09 | Discharge: 2015-07-09 | Disposition: A | Discharge status: Home / Self Care | Payer: Medicaid Other | Source: Ambulatory Visit | Attending: Family Medicine | Admitting: Family Medicine

## 2015-07-09 ENCOUNTER — Ambulatory Visit: Payer: Self-pay | Attending: Family Medicine | Admitting: Family Medicine

## 2015-07-09 VITALS — BP 100/65 | HR 76 | Temp 98.0°F | Resp 16 | Ht 62.0 in | Wt 156.0 lb

## 2015-07-09 DIAGNOSIS — R079 Chest pain, unspecified: Secondary | ICD-10-CM | POA: Insufficient documentation

## 2015-07-09 DIAGNOSIS — N898 Other specified noninflammatory disorders of vagina: Secondary | ICD-10-CM | POA: Insufficient documentation

## 2015-07-09 DIAGNOSIS — N911 Secondary amenorrhea: Secondary | ICD-10-CM | POA: Insufficient documentation

## 2015-07-09 DIAGNOSIS — Z79899 Other long term (current) drug therapy: Secondary | ICD-10-CM | POA: Insufficient documentation

## 2015-07-09 DIAGNOSIS — Z113 Encounter for screening for infections with a predominantly sexual mode of transmission: Secondary | ICD-10-CM | POA: Insufficient documentation

## 2015-07-09 DIAGNOSIS — R5383 Other fatigue: Secondary | ICD-10-CM | POA: Insufficient documentation

## 2015-07-09 DIAGNOSIS — E559 Vitamin D deficiency, unspecified: Secondary | ICD-10-CM | POA: Insufficient documentation

## 2015-07-09 LAB — COMPLETE METABOLIC PANEL WITH GFR
ALK PHOS: 55 U/L (ref 33–115)
ALT: 17 U/L (ref 6–29)
AST: 20 U/L (ref 10–30)
Albumin: 4.2 g/dL (ref 3.6–5.1)
BILIRUBIN TOTAL: 0.5 mg/dL (ref 0.2–1.2)
BUN: 9 mg/dL (ref 7–25)
CALCIUM: 9.4 mg/dL (ref 8.6–10.2)
CO2: 22 mmol/L (ref 20–31)
Chloride: 103 mmol/L (ref 98–110)
Creat: 0.52 mg/dL (ref 0.50–1.10)
Glucose, Bld: 76 mg/dL (ref 65–99)
Potassium: 4 mmol/L (ref 3.5–5.3)
Sodium: 136 mmol/L (ref 135–146)
TOTAL PROTEIN: 7.8 g/dL (ref 6.1–8.1)

## 2015-07-09 LAB — CBC
HCT: 42.2 % (ref 36.0–46.0)
Hemoglobin: 14.4 g/dL (ref 12.0–15.0)
MCH: 27.4 pg (ref 26.0–34.0)
MCHC: 34.1 g/dL (ref 30.0–36.0)
MCV: 80.2 fL (ref 78.0–100.0)
MPV: 9.8 fL (ref 8.6–12.4)
PLATELETS: 304 10*3/uL (ref 150–400)
RBC: 5.26 MIL/uL — AB (ref 3.87–5.11)
RDW: 13.3 % (ref 11.5–15.5)
WBC: 11.2 10*3/uL — ABNORMAL HIGH (ref 4.0–10.5)

## 2015-07-09 LAB — POCT URINE PREGNANCY: Preg Test, Ur: NEGATIVE

## 2015-07-09 LAB — VITAMIN B12: VITAMIN B 12: 1137 pg/mL — AB (ref 211–911)

## 2015-07-09 NOTE — Patient Instructions (Addendum)
Megan [name] was seen today for menstrual problem.  Diagnoses and all orders for this visit:  Vaginal discharge -     Cervicovaginal ancillary only  Other fatigue -     CBC -     COMPLETE METABOLIC PANEL WITH GFR -     Vitamin D, 25-hydroxy -     Vitamin B12  Amenorrhea, secondary -     POCT urine pregnancy -     Estrogens, Total -     FSH/LH -     17-Hydroxyprogesterone -     US Transvaginal Non-OB; Future -     US Pelvis Complete; Future   Meet with financial advisor to discuss bills and letter regarding family planning medicaid  F/u in 4 weeks for secondary amenorrhea to review labs and [place] and discuss next step in treatment  Dr. Armen Pickup

## 2015-07-09 NOTE — Assessment & Plan Note (Signed)
White vaginal discharge. Suspect BV Wet prep and Gc.chlam done will await results and treat accordingly.

## 2015-07-09 NOTE — Assessment & Plan Note (Signed)
A: secondary amenorrhea initially due to depo. Patient has been depo free for one year now. Still without period. Exam normal except for slightly fundal tenderness. P: Labs per orders Pelvic and TVUS Likely course of estrogen to induce periods

## 2015-07-09 NOTE — Progress Notes (Signed)
Used [employer] interpreter Burmese [phone] Irregular periods x 4 years  Pt stated was on Depo-Provera Last Depo on Dec 2015 [facility] with long walks No pain today  No suicidal thought in the past two weeks

## 2015-07-09 NOTE — Progress Notes (Signed)
Patient ID: Megan [name], female   DOB: 24-Jul-1991, 24 y.o.   MRN: [id number]   Subjective:  Patient ID: Megan [name], female    DOB: 1990/09/09  Age: 24 y.o. MRN: [id number]  CC: Menstrual Problem  Burmese interpreter used   HPI [name] presents for    1. Secondary amenorrhea: last bleeding was with  1 yo son when he was born. Previously on depo. Last depo shot given in mid December of 2015. Menarche started at age 80.  She desires pregnancy. She has monthly low back cramps but no bleeding.   2. Fatigue: SOB with long walks. Intermittent chest pain. No CP now at rest. No hx of anemia.   OB History  Gravida Para Term Preterm AB SAB TAB Ectopic Multiple Living  2 2        2     # Outcome Date GA Lbr Len/2nd Weight Sex Delivery Anes PTL Lv  2 Para 05/18/11          1 Para 04/28/10    M          Social History  Substance Use Topics  . Smoking status: Never Smoker   . Smokeless tobacco: Not on file  . Alcohol Use: No    Outpatient Prescriptions Prior to Visit  Medication Sig Dispense Refill  . cetirizine (ZYRTEC) 10 MG tablet Take 1 tablet (10 mg total) by mouth daily. (Patient not taking: Reported on 07/09/2015) 30 tablet 11  . Cholecalciferol (VITAMIN D3) 2000 UNITS TABS Take 2,000 Units by mouth daily. (Patient not taking: Reported on 07/09/2015) 30 tablet 11  . fluticasone (FLONASE) 50 MCG/ACT nasal spray Place 2 sprays into both nostrils daily. (Patient not taking: Reported on 07/09/2015) 16 g 6   No facility-administered medications prior to visit.    ROS Review of Systems  Constitutional: Negative for fever and chills.  Eyes: Negative for visual disturbance.  Respiratory: Negative for shortness of breath.   Cardiovascular: Negative for chest pain.  Gastrointestinal: Negative for abdominal pain and blood in stool.  Genitourinary: Positive for vaginal discharge. Negative for vaginal bleeding and vaginal pain.  Musculoskeletal: Negative for back pain and  arthralgias.  Skin: Negative for rash.  Allergic/Immunologic: Negative for immunocompromised state.  Hematological: Negative for adenopathy. Does not bruise/bleed easily.  Psychiatric/Behavioral: Negative for suicidal ideas and dysphoric mood.    Objective:  BP 100/65 mmHg  Pulse 76  Temp(Src) 98 F (36.7 C) (Oral)  Resp 16  Ht 5\' 2"  (1.575 m)  Wt 156 lb (70.761 kg)  BMI 28.53 kg/m2  SpO2 100%  BP/Weight 07/09/2015 04/21/2015 12/08/2014  Systolic BP 100 101 93  Diastolic BP 65 64 58  Wt. (Lbs) 156 - 152.8  BMI 28.53 - 27.94    Physical Exam  Constitutional: She is oriented to person, place, and time. She appears well-developed and well-nourished. No distress.  HENT:  Head: Normocephalic and atraumatic.  Cardiovascular: Normal rate, regular rhythm, normal heart sounds and intact distal pulses.   Pulmonary/Chest: Effort normal and breath sounds normal.  Genitourinary: Pelvic exam was performed with patient prone. There is no rash, tenderness or lesion on the right labia. There is no rash, tenderness or lesion on the left labia. Uterus is tender. Uterus is not deviated, not enlarged and not fixed. Cervix exhibits no motion tenderness, no discharge and no friability. Right adnexum displays no mass, no tenderness and no fullness. Left adnexum displays no mass, no tenderness and no fullness. No erythema, tenderness or bleeding in the  vagina. No foreign body around the vagina. No signs of injury around the vagina. Vaginal discharge found.  Slight fundal tenderness   Musculoskeletal: She exhibits no edema.  Lymphadenopathy:       Right: No inguinal adenopathy present.       Left: No inguinal adenopathy present.  Neurological: She is alert and oriented to person, place, and time.  Skin: Skin is warm and dry. No rash noted.  Psychiatric: She has a normal mood and affect.    U preg: negative  Assessment & Plan:   Problem List Items Addressed This Visit    Amenorrhea, secondary    Relevant Orders   POCT urine pregnancy (Completed)   Estrogens, Total   FSH/LH   17-Hydroxyprogesterone   US Transvaginal Non-OB   US Pelvis Complete   Fatigue (Chronic)   Relevant Orders   CBC   COMPLETE METABOLIC PANEL WITH GFR   Vitamin D, 25-hydroxy   Vitamin B12    Other Visit Diagnoses    Vaginal discharge    -  Primary    Relevant Orders    Cervicovaginal ancillary only       No orders of the defined types were placed in this encounter.    Follow-up: No Follow-up on file.   Dessa Phi MD

## 2015-07-09 NOTE — Assessment & Plan Note (Signed)
A; chronic fatigue. She has 2 young children. She denies depression. Exam reassuring.  P: CBC, vit D and vit B12 check.

## 2015-07-10 LAB — FSH/LH
FSH: 6.1 m[IU]/mL
LH: 2.5 m[IU]/mL

## 2015-07-10 LAB — CERVICOVAGINAL ANCILLARY ONLY
CHLAMYDIA, DNA PROBE: NEGATIVE
Neisseria Gonorrhea: NEGATIVE
Trichomonas: NEGATIVE
Wet Prep (BD Affirm): NEGATIVE

## 2015-07-10 LAB — VITAMIN D 25 HYDROXY (VIT D DEFICIENCY, FRACTURES): VIT D 25 HYDROXY: 16 ng/mL — AB (ref 30–100)

## 2015-07-13 LAB — 17-HYDROXYPROGESTERONE: 17-OH-Progesterone, LC/MS/MS: 39 ng/dL

## 2015-07-14 LAB — ESTROGENS, TOTAL: ESTROGEN: 156.4 pg/mL

## 2015-07-15 ENCOUNTER — Telehealth: Payer: Self-pay | Admitting: *Deleted

## 2015-07-15 MED ORDER — VITAMIN D (ERGOCALCIFEROL) 1.25 MG (50000 UNIT) PO CAPS: 50000.0000 [IU] | ORAL_CAPSULE | ORAL | Status: DC

## 2015-07-15 NOTE — Telephone Encounter (Signed)
LVM to return call.

## 2015-07-15 NOTE — Addendum Note (Signed)
Addended by: Dessa Phi on: 07/15/2015 08:05 AM   Modules accepted: Orders

## 2015-07-15 NOTE — Telephone Encounter (Signed)
-----   Message from Dessa Phi, MD sent at 07/10/2015  1:42 PM EST ----- Negative yeast, BV on trich Normal vaginal discharge

## 2015-07-15 NOTE — Telephone Encounter (Signed)
-----   Message from Dessa Phi, MD sent at 07/10/2015  9:32 AM EST ----- Screening GC/chlam/trich negative Awaiting BV and yeast results

## 2015-07-16 ENCOUNTER — Ambulatory Visit: Payer: Medicaid Other | Attending: Family Medicine

## 2015-07-24 ENCOUNTER — Other Ambulatory Visit: Payer: Self-pay | Admitting: Family Medicine

## 2015-07-24 ENCOUNTER — Ambulatory Visit (HOSPITAL_COMMUNITY)
Admission: RE | Admit: 2015-07-24 | Discharge: 2015-07-24 | Disposition: A | Discharge status: Home / Self Care | Payer: Medicaid Other | Source: Ambulatory Visit | Attending: Family Medicine | Admitting: Family Medicine

## 2015-07-24 DIAGNOSIS — N911 Secondary amenorrhea: Secondary | ICD-10-CM

## 2015-07-24 DIAGNOSIS — R938 Abnormal findings on diagnostic imaging of other specified body structures: Secondary | ICD-10-CM | POA: Insufficient documentation

## 2015-07-24 DIAGNOSIS — E282 Polycystic ovarian syndrome: Secondary | ICD-10-CM

## 2015-07-24 HISTORY — DX: Polycystic ovarian syndrome: E28.2

## 2015-07-30 ENCOUNTER — Telehealth: Payer: Self-pay | Admitting: *Deleted

## 2015-07-30 NOTE — Telephone Encounter (Signed)
Used [employer] Burmese# [phone] Lab and [place] given to Pt

## 2015-07-30 NOTE — Telephone Encounter (Signed)
-----   Message from Dessa Phi, MD sent at 07/15/2015  8:03 AM EST ----- Hormone levels are all normal Vit D def Vit D supplement ordered

## 2015-07-30 NOTE — Telephone Encounter (Signed)
-----   Message from Dessa Phi, MD sent at 07/24/2015  8:52 AM EST ----- Multiple follicles in ovaries, possible polycystic ovarian syndrome  Gyn referral placed

## 2015-08-11 ENCOUNTER — Ambulatory Visit: Payer: Medicaid Other | Admitting: Family Medicine

## 2015-08-17 ENCOUNTER — Ambulatory Visit: Payer: Self-pay | Attending: Family Medicine | Admitting: Family Medicine

## 2015-08-17 ENCOUNTER — Encounter: Payer: Self-pay | Admitting: Family Medicine

## 2015-08-17 VITALS — BP 100/64 | HR 97 | Temp 98.0°F | Resp 16 | Ht 62.0 in | Wt 157.0 lb

## 2015-08-17 DIAGNOSIS — E282 Polycystic ovarian syndrome: Secondary | ICD-10-CM | POA: Insufficient documentation

## 2015-08-17 DIAGNOSIS — E559 Vitamin D deficiency, unspecified: Secondary | ICD-10-CM | POA: Insufficient documentation

## 2015-08-17 DIAGNOSIS — N912 Amenorrhea, unspecified: Secondary | ICD-10-CM | POA: Insufficient documentation

## 2015-08-17 DIAGNOSIS — N911 Secondary amenorrhea: Secondary | ICD-10-CM

## 2015-08-17 DIAGNOSIS — Z0000 Encounter for general adult medical examination without abnormal findings: Secondary | ICD-10-CM | POA: Insufficient documentation

## 2015-08-17 DIAGNOSIS — Z79899 Other long term (current) drug therapy: Secondary | ICD-10-CM | POA: Insufficient documentation

## 2015-08-17 LAB — LIPID PANEL
CHOLESTEROL: 161 mg/dL (ref 125–200)
HDL: 38 mg/dL — AB (ref >=46)
LDL CALC: 98 mg/dL (ref <130)
Total CHOL/HDL Ratio: 4.2 Ratio (ref <=5.0)
Triglycerides: 123 mg/dL (ref <150)
VLDL: 25 mg/dL (ref <30)

## 2015-08-17 LAB — POCT GLYCOSYLATED HEMOGLOBIN (HGB A1C): Hemoglobin A1C: 5.5

## 2015-08-17 LAB — POCT URINE PREGNANCY: PREG TEST UR: NEGATIVE

## 2015-08-17 MED FILL — VIT D2 1.25 MG (50,000 UNIT: 1.25 MG | 28 days supply | Qty: 4 | Fill #0

## 2015-08-17 NOTE — Assessment & Plan Note (Signed)
Secondary amenorrhea and polycystic ovaries concerning for PCOS. A1c normal.  P: Weight loss, 10 lbs recommended by lowering carbs and increasing exercise Gyn referral, patient is aware of appt time and location. She will go with a friend who will drive her. She will need an interpreter.

## 2015-08-17 NOTE — Progress Notes (Signed)
Used [employer] video Interpreter Burmese [phone] F/U US results  No pain today  No tobacco user  No suicidal thought in the past two weeks

## 2015-08-17 NOTE — Progress Notes (Signed)
Patient ID: Megan [name], female   DOB: 07/03/1991, 25 y.o.   MRN: [id number]   Subjective:  Patient ID: Megan [name], female    DOB: June 08, 1991  Age: 25 y.o. MRN: [id number]  CC: Amenorrhea  Burmese interpreter used   HPI Megan [name] presents for    1. Amenorrhea: patient still has not had a period. LMP 4 years ago. After last OV she had pelvis and TVUS done. She is here today to discuss the results.  We reviewed with results with the assistance of the interpreter and pictures from the Internet. She is aware that the has polycystic ovaries  concerning for polycystic ovarian syndrome. She aware that she been referred to gynecology and the appointment is on 08/19/2015.   2. Vit D def: she has not yet started oral vit D replacement.   Social History  Substance Use Topics  . Smoking status: Never Smoker   . Smokeless tobacco: Not on file  . Alcohol Use: No    OB History  Gravida Para Term Preterm AB SAB TAB Ectopic Multiple Living  2 2        2     # Outcome Date GA Lbr Len/2nd Weight Sex Delivery Anes PTL Lv  2 Para 05/18/11          1 Para 04/28/10    M           Outpatient Prescriptions Prior to Visit  Medication Sig Dispense Refill  . Vitamin D, Ergocalciferol, (DRISDOL) 50000 UNITS CAPS capsule Take 1 capsule (50,000 Units total) by mouth every 7 (seven) days. For 8 weeks (Patient not taking: Reported on 08/17/2015) 8 capsule 0   No facility-administered medications prior to visit.    ROS Review of Systems  Constitutional: Negative for fever and chills.  Eyes: Negative for visual disturbance.  Respiratory: Negative for shortness of breath.   Cardiovascular: Negative for chest pain.  Gastrointestinal: Negative for abdominal pain and blood in stool.  Genitourinary: Positive for menstrual problem. Negative for vaginal bleeding, vaginal discharge and vaginal pain.  Musculoskeletal: Negative for back pain and arthralgias.  Skin: Negative for rash.    Allergic/Immunologic: Negative for immunocompromised state.  Hematological: Negative for adenopathy. Does not bruise/bleed easily.  Psychiatric/Behavioral: Negative for suicidal ideas and dysphoric mood.   Objective:  BP 100/64 mmHg  Pulse 97  Temp(Src) 98 F (36.7 C) (Oral)  Resp 16  Ht 5\' 2"  (1.575 m)  Wt 157 lb (71.215 kg)  BMI 28.71 kg/m2  SpO2 98%  BP/Weight 08/17/2015 07/09/2015 04/21/2015  Systolic BP 100 100 101  Diastolic BP 64 65 64  Wt. (Lbs) 157 156 -  BMI 28.71 28.53 -     Physical Exam  Constitutional: She is oriented to person, place, and time. She appears well-developed and well-nourished. No distress.  HENT:  Head: Normocephalic and atraumatic.  Pulmonary/Chest: Effort normal and breath sounds normal.  Musculoskeletal: She exhibits no edema.  Neurological: She is alert and oriented to person, place, and time.  Skin: Skin is warm and dry. No rash noted.  Psychiatric: She has a normal mood and affect.    Lab Results  Component Value Date   HGBA1C 5.50 08/17/2015   U preg negative  Assessment & Plan:   [name] was seen today for amenorrhea.  Diagnoses and all orders for this visit:  Healthcare maintenance -     POCT glycosylated hemoglobin (Hb A1C)  Polycystic ovaries -     Lipid Panel  Amenorrhea, secondary -  POCT urine pregnancy    No orders of the defined types were placed in this encounter.    Follow-up: No Follow-up on file.   Dessa Phi MD

## 2015-08-17 NOTE — Patient Instructions (Addendum)
Start vit D  Start weight loss with low sugar and low carb diet. Increase exercise goal, 10 lb weight loss.   Keep gyn appt for PCOS.   F/u in 3 months   Dr. Armen Pickup   Polycystic Ovarian Syndrome Polycystic ovarian syndrome (PCOS) is a common hormonal disorder among women of reproductive age. Most women with PCOS grow many small cysts on their ovaries. PCOS can cause problems with your periods and make it difficult to get pregnant. It can also cause an increased risk of miscarriage with pregnancy. If left untreated, PCOS can lead to serious health problems, such as diabetes and heart disease. CAUSES The cause of PCOS is not fully understood, but genetics may be a factor. SIGNS AND SYMPTOMS   Infrequent or no menstrual periods.   Inability to get pregnant (infertility) because of not ovulating.   Increased growth of hair on the face, chest, stomach, back, thumbs, thighs, or toes.   Acne, oily skin, or dandruff.   Pelvic pain.   Weight gain or obesity, usually carrying extra weight around the waist.   Type 2 diabetes.   High cholesterol.   High blood pressure.   Female-pattern baldness or thinning hair.   Patches of thickened and dark brown or black skin on the neck, arms, breasts, or thighs.   Tiny excess flaps of skin (skin tags) in the armpits or neck area.   Excessive snoring and having breathing stop at times while asleep (sleep apnea).   Deepening of the voice.   Gestational diabetes when pregnant.  DIAGNOSIS  There is no single test to diagnose PCOS.   Your health care provider will:   Take a medical history.   Perform a pelvic exam.   Have ultrasonography done.   Check your female and female hormone levels.   Measure glucose or sugar levels in the blood.   Do other blood tests.   If you are producing too many female hormones, your health care provider will make sure it is from PCOS. At the physical exam, your health care provider  will want to evaluate the areas of increased hair growth. Try to allow natural hair growth for a few days before the visit.   During a pelvic exam, the ovaries may be enlarged or swollen because of the increased number of small cysts. This can be seen more easily by using vaginal ultrasonography or screening to examine the ovaries and lining of the uterus (endometrium) for cysts. The uterine lining may become thicker if you have not been having a regular period.  TREATMENT  Because there is no cure for PCOS, it needs to be managed to prevent problems. Treatments are based on your symptoms. Treatment is also based on whether you want to have a baby or whether you need contraception.  Treatment may include:   Progesterone hormone to start a menstrual period.   Birth control pills to make you have regular menstrual periods.   Medicines to make you ovulate, if you want to get pregnant.   Medicines to control your insulin.   Medicine to control your blood pressure.   Medicine and diet to control your high cholesterol and triglycerides in your blood.  Medicine to reduce excessive hair growth.  Surgery, making small holes in the ovary, to decrease the amount of female hormone production. This is done through a long, lighted tube (laparoscope) placed into the pelvis through a tiny incision in the lower abdomen.  HOME CARE INSTRUCTIONS  Only take over-the-counter or  prescription medicine as directed by your health care provider.  Pay attention to the foods you eat and your activity levels. This can help reduce the effects of PCOS.  Keep your weight under control.  Eat foods that are low in carbohydrate and high in fiber.  Exercise regularly. SEEK MEDICAL CARE IF:  Your symptoms do not get better with medicine.  You have new symptoms.   This information is not intended to replace advice given to you by your health care provider. Make sure you discuss any questions you have with  your health care provider.   Document Released: 11/04/2004 Document Revised: 05/01/2013 Document Reviewed: 12/27/2012 Elsevier Interactive Patient Education [employer]

## 2015-08-19 ENCOUNTER — Ambulatory Visit (INDEPENDENT_AMBULATORY_CARE_PROVIDER_SITE_OTHER): Payer: Self-pay | Admitting: Obstetrics & Gynecology

## 2015-08-19 ENCOUNTER — Encounter: Payer: Self-pay | Admitting: Obstetrics & Gynecology

## 2015-08-19 VITALS — BP 80/46 | HR 81 | Temp 98.1°F | Ht 64.0 in | Wt 157.0 lb

## 2015-08-19 DIAGNOSIS — N911 Secondary amenorrhea: Secondary | ICD-10-CM

## 2015-08-19 MED ORDER — MEDROXYPROGESTERONE ACETATE 10 MG PO TABS: 10.0000 mg | ORAL_TABLET | Freq: Every day | ORAL | Status: DC

## 2015-08-19 MED FILL — MEDROXYPROGESTERONE 10 MG T: 10 | 10 days supply | Qty: 10 | Fill #0

## 2015-08-19 NOTE — Progress Notes (Signed)
Win Khine used for interpreter

## 2015-08-19 NOTE — Progress Notes (Signed)
Patient ID: Megan [name], female   DOB: [birth year], 25 y.o.   MRN: [id number] History:  25 y.o. G2P2 here today for 'I want to get pregnant.'  Pt reports amenorrhea for 4 years but, was on Depo Provera until 8 months previously.  It was due in March 2016.  She does not recall how long she was on it.      The following portions of the patient's history were reviewed and updated as appropriate: allergies, current medications, past family history, past medical history, past social history, past surgical history and problem list.  Review of Systems:  Pertinent items are noted in HPI.  Objective:  Physical Exam Blood pressure 80/46, pulse 81, temperature 98.1 F (36.7 C), temperature source Oral, height 5\' 4"  (1.626 m), weight 157 lb (71.215 kg), last menstrual period 06/25/2011. Gen: NAD   Labs and Imaging 14/07/2010 Transvaginal Non-ob  07/24/2015  CLINICAL DATA:  Amenorrhea for 4 years. EXAM: TRANSABDOMINAL AND TRANSVAGINAL ULTRASOUND OF PELVIS TECHNIQUE: Both transabdominal and transvaginal ultrasound examinations of the pelvis were performed. Transabdominal technique was performed for global imaging of the pelvis including uterus, ovaries, adnexal regions, and pelvic cul-de-sac. It was necessary to proceed with endovaginal exam following the transabdominal exam to visualize the ovaries. COMPARISON:  None FINDINGS: Uterus Measurements: 5.7 x 3.1 x 4.6 cm. No fibroids or other mass visualized. Endometrium Thickness: 2.9 mm.  No focal abnormality visualized. Right ovary Measurements: 2.6 x 2.2 x 2.0 cm. The size is normal. There are multiple peripheral follicles typical for polycystic ovary disease. Left ovary Measurements: 2.9 x 1.9 x 1.8 cm. Multiple peripheral follicles typical for polycystic ovary disease. However, the ovary is not enlarged. Other findings No abnormal free fluid. IMPRESSION: Multiple peripheral follicles in both ovaries typical for polycystic ovary disease. However, the ovaries are not  enlarged. Otherwise, normal exam. Electronically Signed   By: 07/26/2015 M.D.   On: 07/24/2015 08:39   07/26/2015 Pelvis Complete  07/24/2015  CLINICAL DATA:  Amenorrhea for 4 years. EXAM: TRANSABDOMINAL AND TRANSVAGINAL ULTRASOUND OF PELVIS TECHNIQUE: Both transabdominal and transvaginal ultrasound examinations of the pelvis were performed. Transabdominal technique was performed for global imaging of the pelvis including uterus, ovaries, adnexal regions, and pelvic cul-de-sac. It was necessary to proceed with endovaginal exam following the transabdominal exam to visualize the ovaries. COMPARISON:  None FINDINGS: Uterus Measurements: 5.7 x 3.1 x 4.6 cm. No fibroids or other mass visualized. Endometrium Thickness: 2.9 mm.  No focal abnormality visualized. Right ovary Measurements: 2.6 x 2.2 x 2.0 cm. The size is normal. There are multiple peripheral follicles typical for polycystic ovary disease. Left ovary Measurements: 2.9 x 1.9 x 1.8 cm. Multiple peripheral follicles typical for polycystic ovary disease. However, the ovary is not enlarged. Other findings No abnormal free fluid. IMPRESSION: Multiple peripheral follicles in both ovaries typical for polycystic ovary disease. However, the ovaries are not enlarged. Otherwise, normal exam. Electronically Signed   By: 07/26/2015 M.D.   On: 07/24/2015 08:39    Assessment & Plan:  Secondary amenorrhea Provera 10mg  1 po q day for 10 days I explained to pt that we do not do infertility eval or workups here. F/u prn   Leslie Langille L. Harraway-Smith, M.D., 07/26/2015

## 2015-09-15 ENCOUNTER — Ambulatory Visit: Payer: Medicaid Other | Attending: Family Medicine

## 2015-11-03 ENCOUNTER — Ambulatory Visit: Payer: Medicaid Other | Attending: Family Medicine

## 2016-02-12 ENCOUNTER — Ambulatory Visit: Payer: Self-pay | Attending: Family Medicine

## 2016-03-02 ENCOUNTER — Ambulatory Visit: Payer: Self-pay | Attending: Family Medicine | Admitting: Family Medicine

## 2016-03-02 ENCOUNTER — Encounter: Payer: Self-pay | Admitting: Family Medicine

## 2016-03-02 VITALS — BP 100/68 | HR 83 | Temp 97.4°F | Ht 64.0 in | Wt 159.8 lb

## 2016-03-02 DIAGNOSIS — N926 Irregular menstruation, unspecified: Secondary | ICD-10-CM

## 2016-03-02 DIAGNOSIS — M791 Myalgia, unspecified site: Secondary | ICD-10-CM

## 2016-03-02 DIAGNOSIS — E559 Vitamin D deficiency, unspecified: Secondary | ICD-10-CM

## 2016-03-02 HISTORY — DX: Irregular menstruation, unspecified: N92.6

## 2016-03-02 LAB — POCT URINE PREGNANCY: PREG TEST UR: NEGATIVE

## 2016-03-02 LAB — CBC
HEMATOCRIT: 43.5 % (ref 35.0–45.0)
Hemoglobin: 14.4 g/dL (ref 11.7–15.5)
MCH: 27.7 pg (ref 27.0–33.0)
MCHC: 33.1 g/dL (ref 32.0–36.0)
MCV: 83.7 fL (ref 80.0–100.0)
MPV: 10.3 fL (ref 7.5–12.5)
PLATELETS: 323 10*3/uL (ref 140–400)
RBC: 5.2 MIL/uL — AB (ref 3.80–5.10)
RDW: 13.2 % (ref 11.0–15.0)
WBC: 9.5 10*3/uL (ref 3.8–10.8)

## 2016-03-02 MED ORDER — NAPROXEN 500 MG PO TABS: 500.0000 mg | ORAL_TABLET | Freq: Two times a day (BID) | ORAL | 0 refills | Status: DC

## 2016-03-02 NOTE — Assessment & Plan Note (Signed)
Negative U preg today Having periods just irregular Desires pregnancy  Continue unprotected sex

## 2016-03-02 NOTE — Patient Instructions (Addendum)
Megan [name] was seen today for follow-up.  Diagnoses and all orders for this visit:  Myalgia -     Vitamin D, 25-hydroxy -     CBC -     Sedimentation Rate -     C-reactive protein -     CK -     naproxen (NAPROSYN) 500 MG tablet; Take 1 tablet (500 mg total) by mouth 2 (two) times daily with a meal.  Irregular periods -     POCT urine pregnancy    U preg negative Continue regular sex   F/u in 2 months for flu shot and myalgias or sooner if needed   Dr. Armen Pickup

## 2016-03-02 NOTE — Progress Notes (Signed)
Subjective:  Patient ID: Megan [name], female    DOB: [birth year]  Age: 25 y.o. MRN: [id number]  CC: Follow-up   HPI Megan [name] presents for hand pain  1. Arm pain: pain x 2 weeks in L anterior shoulder, L and R forearm. No injury. No redness or swelling. 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 She has not tried anything for the pain at home.  2. Irregular periods: she took a 10 day course of provera 10 mg in 07/2015 following secondary amenorrhea. She now has periods every other month. LMP 02/16/16. She desires pregnancy. She has some dizziness and nausea in the mornings.   Social History  Substance Use Topics  . Smoking status: Never Smoker  . Smokeless tobacco: Not on file  . Alcohol use No    Outpatient Medications Prior to Visit  Medication Sig Dispense Refill  . medroxyPROGESTERone (PROVERA) 10 MG tablet Take 1 tablet (10 mg total) by mouth daily. Use for ten days 10 tablet 0  . Vitamin D, Ergocalciferol, (DRISDOL) 50000 UNITS CAPS capsule Take 1 capsule (50,000 Units total) by mouth every 7 (seven) days. For 8 weeks 8 capsule 0   No facility-administered medications prior to visit.     ROS Review of Systems  Constitutional: Negative for chills and fever.  Eyes: Negative for visual disturbance.  Respiratory: Negative for shortness of breath.   Cardiovascular: Negative for chest pain.  Gastrointestinal: Positive for nausea. Negative for abdominal pain and blood in stool.  Musculoskeletal: Positive for arthralgias. Negative for back pain.  Skin: Negative for rash.  Allergic/Immunologic: Negative for immunocompromised state.  Neurological: Positive for dizziness.  Hematological: Negative for adenopathy. Does not bruise/bleed easily.  Psychiatric/Behavioral: Negative for dysphoric mood and suicidal ideas.    Objective:  BP 100/68 (BP Location: Left  Arm, Patient Position: Sitting, Cuff Size: Large)   Pulse 83   Temp 97.4 F (36.3 C) (Oral)   Ht 5\' 4"  (1.626 m)   Wt 159 lb 12.8 oz (72.5 kg)   SpO2 97%   BMI 27.43 kg/m   BP/Weight 03/02/2016 08/19/2015 08/17/2015  Systolic BP 100 80 100  Diastolic BP 68 46 64  Wt. (Lbs) 159.8 157 157  BMI 27.43 26.94 28.71    Physical Exam  Constitutional: She is oriented to person, place, and time. She appears well-developed and well-nourished. No distress.  HENT:  Head: Normocephalic and atraumatic.  Cardiovascular: Normal rate, regular rhythm, normal heart sounds and intact distal pulses.   Pulmonary/Chest: Effort normal and breath sounds normal.  Musculoskeletal: She exhibits no edema.       Left shoulder: She exhibits tenderness.       Right wrist: Normal.       Left wrist: Normal.       Arms: Neurological: She is alert and oriented to person, place, and time.  Skin: Skin is warm and dry. No rash noted.  Psychiatric: She has a normal mood and affect.     Assessment & Plan:  Chriss was seen today for follow-up.  Diagnoses and all orders for this visit:  Myalgia -     Vitamin D, 25-hydroxy -     CBC -     Sedimentation Rate -     C-reactive protein -     CK -     naproxen (NAPROSYN) 500 MG tablet; Take 1 tablet (500 mg total) by mouth 2 (two)  times daily with a meal.  Irregular periods -     POCT urine pregnancy   There are no diagnoses linked to this encounter.  No orders of the defined types were placed in this encounter.   Follow-up: No Follow-up on file.   Dessa Phi MD

## 2016-03-02 NOTE — Assessment & Plan Note (Signed)
A: myalgias without evidence of arthritis or rash. Cervical radiculopathy is still possible P: Naproxen Checking labs to r/o inflammatory process

## 2016-03-02 NOTE — Progress Notes (Signed)
C/C:patient states that hands hurts, they have been hurting for weeks

## 2016-03-03 DIAGNOSIS — E559 Vitamin D deficiency, unspecified: Secondary | ICD-10-CM | POA: Insufficient documentation

## 2016-03-03 LAB — SEDIMENTATION RATE: Sed Rate: 8 mm/hr (ref 0–20)

## 2016-03-03 LAB — CK: CK TOTAL: 66 U/L (ref 7–177)

## 2016-03-03 LAB — C-REACTIVE PROTEIN

## 2016-03-03 LAB — VITAMIN D 25 HYDROXY (VIT D DEFICIENCY, FRACTURES): Vit D, 25-Hydroxy: 25 ng/mL — ABNORMAL LOW (ref 30–100)

## 2016-03-03 MED ORDER — VITAMIN D3 50 MCG (2000 UT) PO TABS: 2000.0000 [IU] | ORAL_TABLET | Freq: Every day | ORAL | 11 refills | Status: DC

## 2016-03-03 NOTE — Addendum Note (Signed)
Addended by: Dessa Phi on: 03/03/2016 03:59 PM   Modules accepted: Orders

## 2016-03-14 ENCOUNTER — Telehealth: Payer: Self-pay

## 2016-03-14 NOTE — Telephone Encounter (Signed)
-----   Message from Dessa Phi, MD sent at 03/03/2016  3:57 PM EDT ----- Labs to check of inflammation in bones and muscles are normal Vit D improved to 25 this is still a bit low, start  2000 IU vit D3 daily

## 2016-03-14 NOTE — Progress Notes (Signed)
Call placed to [employer] at [phone], interpreter ID [id number] assisted with translation of call. Call placed to patient at [phone], message left requesting return call to [facility] [phone]. When patient calls please advise: Labs to check of inflammation in bones and muscles are normal  Vit D improved to 25 this is still a bit low, start 2000 IU vit D3 daily.    

## 2016-03-14 NOTE — Telephone Encounter (Signed)
Call placed to [employer] at [phone], interpreter ID [phone] assisted with translation of call. Call placed to patient at [phone], message left requesting return call to [facility] [phone]. When patient calls please advise: Labs to check of inflammation in bones and muscles are normal  Vit D improved to 25 this is still a bit low, start 2000 IU vit D3 daily.

## 2016-04-08 ENCOUNTER — Encounter: Payer: Self-pay | Admitting: Family Medicine

## 2016-04-08 ENCOUNTER — Ambulatory Visit: Payer: Self-pay | Attending: Family Medicine | Admitting: Family Medicine

## 2016-04-08 VITALS — BP 108/69 | HR 76 | Temp 97.9°F | Ht 64.0 in | Wt 164.4 lb

## 2016-04-08 DIAGNOSIS — N926 Irregular menstruation, unspecified: Secondary | ICD-10-CM | POA: Insufficient documentation

## 2016-04-08 DIAGNOSIS — E559 Vitamin D deficiency, unspecified: Secondary | ICD-10-CM | POA: Insufficient documentation

## 2016-04-08 DIAGNOSIS — Z23 Encounter for immunization: Secondary | ICD-10-CM | POA: Insufficient documentation

## 2016-04-08 DIAGNOSIS — F32A Depression, unspecified: Secondary | ICD-10-CM

## 2016-04-08 DIAGNOSIS — I8390 Asymptomatic varicose veins of unspecified lower extremity: Secondary | ICD-10-CM | POA: Insufficient documentation

## 2016-04-08 DIAGNOSIS — F329 Major depressive disorder, single episode, unspecified: Secondary | ICD-10-CM | POA: Insufficient documentation

## 2016-04-08 DIAGNOSIS — I8393 Asymptomatic varicose veins of bilateral lower extremities: Secondary | ICD-10-CM | POA: Insufficient documentation

## 2016-04-08 MED ORDER — VITAMIN D3 50 MCG (2000 UT) PO TABS: 2000.0000 [IU] | ORAL_TABLET | Freq: Every day | ORAL | 11 refills | Status: DC

## 2016-04-08 MED ORDER — MEDICAL COMPRESSION STOCKINGS MISC: 0 refills | Status: DC

## 2016-04-08 MED ORDER — METFORMIN HCL ER 500 MG PO TB24: 500.0000 mg | ORAL_TABLET | Freq: Every day | ORAL | 0 refills | Status: DC

## 2016-04-08 MED ORDER — BUPROPION HCL ER (SR) 150 MG PO TB12: ORAL_TABLET | ORAL | 2 refills | Status: DC

## 2016-04-08 MED ORDER — METFORMIN HCL ER 500 MG PO TB24: 500.0000 mg | ORAL_TABLET | Freq: Every day | ORAL | 3 refills | Status: DC

## 2016-04-08 NOTE — Progress Notes (Signed)
Subjective:  Patient ID: Megan [name], female    DOB: [birth year]  Age: 25 y.o. MRN: [id number]  CC: Follow-up   HPI Vendela Guidry presents for   1. Varicose veins: x 3 months. Pain less. Both lower legs.  She has been gaining weight.   2. Desires pregnancy: having unprotected sex since January 2017 without pregnancy. Periods are irregular. She has two young sons.   3. Feeling sad: she feels sad and irritable. She shouts and crys. She has felt this way for over one year. She does not have a friend to talk to about her mood. Having trouble focusing and retaining information she learns at school. She is sleep well. She has gained weight. Other than her husband and her sons she hs not other family in he area.   Social History  Substance Use Topics  . Smoking status: Never Smoker  . Smokeless tobacco: Not on file  . Alcohol use No    Outpatient Medications Prior to Visit  Medication Sig Dispense Refill  . Cholecalciferol (VITAMIN D3) 2000 units TABS Take 2,000 Units by mouth daily. (Patient not taking: Reported on 04/08/2016) 30 tablet 11  . naproxen (NAPROSYN) 500 MG tablet Take 1 tablet (500 mg total) by mouth 2 (two) times daily with a meal. (Patient not taking: Reported on 04/08/2016) 60 tablet 0   No facility-administered medications prior to visit.     ROS Review of Systems  Constitutional: Negative for chills and fever.  Eyes: Negative for visual disturbance.  Respiratory: Negative for shortness of breath.   Cardiovascular: Negative for chest pain.  Gastrointestinal: Negative for abdominal pain and blood in stool.  Musculoskeletal: Negative for arthralgias and back pain.  Skin: Negative for rash.  Allergic/Immunologic: Negative for immunocompromised state.  Hematological: Negative for adenopathy. Does not bruise/bleed easily.  Psychiatric/Behavioral: Positive for dysphoric mood. Negative for sleep disturbance and suicidal ideas. The patient is nervous/anxious.      Objective:  BP 108/69 (BP Location: Right Arm, Patient Position: Sitting, Cuff Size: Small)   Pulse 76   Temp 97.9 F (36.6 C) (Oral)   Ht 5\' 4"  (1.626 m)   Wt 164 lb 6.4 oz (74.6 kg)   LMP 04/04/2016   SpO2 97%   BMI 28.22 kg/m   BP/Weight 04/08/2016 03/02/2016 08/19/2015  Systolic BP 108 100 80  Diastolic BP 69 68 46  Wt. (Lbs) 164.4 159.8 157  BMI 28.22 27.43 26.94     Physical Exam  Constitutional: She is oriented to person, place, and time. She appears well-developed and well-nourished. No distress.  HENT:  Head: Normocephalic and atraumatic.  Cardiovascular: Normal heart sounds.   Pulmonary/Chest: Effort normal.  Musculoskeletal: She exhibits no edema.       Legs: Neurological: She is alert and oriented to person, place, and time.  Skin: Skin is warm and dry. No rash noted.  Psychiatric: She exhibits a depressed mood.  Tearful during exam    Lab Results  Component Value Date   HGBA1C 5.50 08/17/2015    Depression screen [facility] 2/9 04/08/2016 04/08/2016 08/17/2015  Decreased Interest 1 0 0  Down, Depressed, Hopeless 1 0 0  PHQ - 2 Score 2 0 0  Altered sleeping 1 - -  Tired, decreased energy 1 - -  Change in appetite 1 - -  Feeling bad or failure about yourself  1 - -  Trouble concentrating 1 - -  Moving slowly or fidgety/restless 1 - -  Suicidal thoughts 1 - -  PHQ-9  Score 9 - -   GAD 7 : Generalized Anxiety Score 04/08/2016  Nervous, Anxious, on Edge 1  Control/stop worrying 1  Worry too much - different things 1  Trouble relaxing 1  Restless 1  Easily annoyed or irritable 1  Afraid - awful might happen 1  Total GAD 7 Score 7      Assessment & Plan:  Keryl was seen today for follow-up.  Diagnoses and all orders for this visit:  Irregular menstrual cycle -     Discontinue: metFORMIN (GLUCOPHAGE XR) 500 MG 24 hr tablet; Take 1 tablet (500 mg total) by mouth daily with breakfast. -     metFORMIN (GLUCOPHAGE XR) 500 MG 24 hr tablet; Take 1 tablet  (500 mg total) by mouth daily with breakfast.  Varicose vein of leg -     Elastic Bandages & Supports (MEDICAL COMPRESSION STOCKINGS) MISC; Knee high compression stockings 25-30 mm Hg  Depression -     buPROPion (WELLBUTRIN SR) 150 MG 12 hr tablet; Take by mouth  1 pill daily for 3 days, then take twice daily  Vitamin D insufficiency -     Cholecalciferol (VITAMIN D3) 2000 units TABS; Take 2,000 Units by mouth daily.  Encounter for immunization -     Flu Vaccine QUAD 36+ mos IM   There are no diagnoses linked to this encounter.  No orders of the defined types were placed in this encounter.   Follow-up: Return in about 4 weeks (around 05/06/2016) for depression .   Dessa Phi MD

## 2016-04-08 NOTE — Assessment & Plan Note (Signed)
A: irregular menses with polycystic ovaries, ? PCOS. No evidence of excess androgenism P: Metformin 500 mg XR daily

## 2016-04-08 NOTE — Progress Notes (Signed)
Pt has blood veins on her leg. Pt wants flu shot today

## 2016-04-08 NOTE — Assessment & Plan Note (Signed)
Compression stockings Weight loss

## 2016-04-08 NOTE — Assessment & Plan Note (Signed)
Depression x one year, no SI  Plan: wellbutrin Patient advised to seek family counseling at [facility] the [place]

## 2016-04-08 NOTE — Patient Instructions (Addendum)
Berdia was seen today for follow-up.  Diagnoses and all orders for this visit:  Irregular menstrual cycle -     Discontinue: metFORMIN (GLUCOPHAGE XR) 500 MG 24 hr tablet; Take 1 tablet (500 mg total) by mouth daily with breakfast. -     metFORMIN (GLUCOPHAGE XR) 500 MG 24 hr tablet; Take 1 tablet (500 mg total) by mouth daily with breakfast.  Varicose vein of leg -     Elastic Bandages & Supports (MEDICAL COMPRESSION STOCKINGS) MISC; Knee high compression stockings 25-30 mm Hg  Depression -     buPROPion (WELLBUTRIN SR) 150 MG 12 hr tablet; Take by mouth  1 pill daily for 3 days, then take twice daily  Vitamin D insufficiency -     Cholecalciferol (VITAMIN D3) 2000 units TABS; Take 2,000 Units by mouth daily.  Counseling services available at [facility] [place], [place] and [place].   F/u in 4 weeks for depression  Dr. Armen Pickup

## 2016-04-11 ENCOUNTER — Other Ambulatory Visit: Payer: Self-pay | Admitting: Family Medicine

## 2016-04-11 DIAGNOSIS — F32A Depression, unspecified: Secondary | ICD-10-CM

## 2016-04-11 DIAGNOSIS — F329 Major depressive disorder, single episode, unspecified: Secondary | ICD-10-CM

## 2016-04-11 DIAGNOSIS — N926 Irregular menstruation, unspecified: Secondary | ICD-10-CM

## 2016-04-11 NOTE — Telephone Encounter (Signed)
Pt came in stating that she was prescribed medication for irregular periods and pain in her R arm and this medication was not at the pharmacy Pharmacy instructed pt to contact PCP

## 2016-04-14 MED ORDER — BUPROPION HCL ER (SR) 150 MG PO TB12: ORAL_TABLET | ORAL | 2 refills | Status: DC

## 2016-04-14 MED ORDER — METFORMIN HCL ER 500 MG PO TB24: 500.0000 mg | ORAL_TABLET | Freq: Every day | ORAL | 0 refills | Status: DC

## 2016-04-14 NOTE — Telephone Encounter (Signed)
Patient was prescribed metformin and Wellbutrin at alst OV Both sent to [facility] pharmacy meds resent today

## 2016-04-22 NOTE — Progress Notes (Signed)
Letter sent advising to call.

## 2016-04-22 NOTE — Telephone Encounter (Signed)
Message states "voicemail is full".  Letter sent.

## 2016-07-15 ENCOUNTER — Ambulatory Visit: Payer: Medicaid Other | Attending: Family Medicine

## 2016-07-21 ENCOUNTER — Ambulatory Visit: Payer: Medicaid Other | Admitting: Family Medicine

## 2016-07-29 ENCOUNTER — Ambulatory Visit: Payer: Medicaid Other | Admitting: Family Medicine

## 2016-08-15 ENCOUNTER — Ambulatory Visit: Payer: Medicaid Other | Admitting: Family Medicine

## 2016-08-23 ENCOUNTER — Encounter: Payer: Self-pay | Admitting: Family Medicine

## 2016-08-23 ENCOUNTER — Ambulatory Visit: Payer: Self-pay | Attending: Family Medicine | Admitting: Family Medicine

## 2016-08-23 VITALS — BP 112/77 | HR 85 | Temp 98.3°F | Ht 64.0 in | Wt 164.6 lb

## 2016-08-23 DIAGNOSIS — N926 Irregular menstruation, unspecified: Secondary | ICD-10-CM

## 2016-08-23 DIAGNOSIS — F329 Major depressive disorder, single episode, unspecified: Secondary | ICD-10-CM | POA: Insufficient documentation

## 2016-08-23 DIAGNOSIS — F320 Major depressive disorder, single episode, mild: Secondary | ICD-10-CM

## 2016-08-23 DIAGNOSIS — E282 Polycystic ovarian syndrome: Secondary | ICD-10-CM

## 2016-08-23 DIAGNOSIS — E559 Vitamin D deficiency, unspecified: Secondary | ICD-10-CM

## 2016-08-23 LAB — POCT GLYCOSYLATED HEMOGLOBIN (HGB A1C): Hemoglobin A1C: 5.6

## 2016-08-23 MED ORDER — METFORMIN HCL ER 500 MG PO TB24: 500.0000 mg | ORAL_TABLET | Freq: Every day | ORAL | 2 refills | Status: DC

## 2016-08-23 MED ORDER — ESCITALOPRAM OXALATE 10 MG PO TABS: 10.0000 mg | ORAL_TABLET | Freq: Every day | ORAL | 2 refills | Status: DC

## 2016-08-23 MED FILL — METFORMIN HCL ER 500 MG TAB: 500 | 30 days supply | Qty: 30 | Fill #0

## 2016-08-23 MED FILL — ESCITALOPRAM 10 MG TABLET: 10 | 30 days supply | Qty: 30 | Fill #0

## 2016-08-23 NOTE — Progress Notes (Signed)
Pt is having trouble sleeping. Pt is also trying to have a baby.

## 2016-08-23 NOTE — Assessment & Plan Note (Signed)
Mild depression Start lexapro

## 2016-08-23 NOTE — Assessment & Plan Note (Signed)
Start metformin to regulate periods

## 2016-08-23 NOTE — Progress Notes (Signed)
Subjective:  Patient ID: Megan [name], female    DOB: October 06, 1990  Age: 26 y.o. MRN: [id number]  CC: Depression   HPI Megan [name] has depression and PCOS she presents for   1. Desires pregnancy: having unprotected sex since January 2017 without pregnancy. Periods are irregular. They occur every other month.  She has two young sons. Has polycystic ovaries on last pelvic/TVUS.   2. Depression: she feels sad and irritable. She shouts and crys. She has felt this way for over one year. She does not have a friend to talk to about her mood. Having trouble focusing and retaining information she learns at school. She is not  sleep well. She has gained weight. Other than her husband and her sons she has not other family in he area. She has not started prescribed wellbutrin or started counseling as suggested.   Social History  Substance Use Topics  . Smoking status: Never Smoker  . Smokeless tobacco: Never Used  . Alcohol use No    Outpatient Medications Prior to Visit  Medication Sig Dispense Refill  . buPROPion (WELLBUTRIN SR) 150 MG 12 hr tablet Take by mouth  1 pill daily for 3 days, then take twice daily (Patient not taking: Reported on 08/23/2016) 60 tablet 2  . Cholecalciferol (VITAMIN D3) 2000 units TABS Take 2,000 Units by mouth daily. (Patient not taking: Reported on 08/23/2016) 30 tablet 11  . Elastic Bandages & Supports (MEDICAL COMPRESSION STOCKINGS) MISC Knee high compression stockings 25-30 mm Hg (Patient not taking: Reported on 08/23/2016) 2 each 0  . metFORMIN (GLUCOPHAGE XR) 500 MG 24 hr tablet Take 1 tablet (500 mg total) by mouth daily with breakfast. (Patient not taking: Reported on 08/23/2016) 90 tablet 0   No facility-administered medications prior to visit.     ROS Review of Systems  Constitutional: Negative for chills and fever.  Eyes: Positive for visual disturbance.  Respiratory: Negative for shortness of breath.   Cardiovascular: Negative for chest pain.    Gastrointestinal: Negative for abdominal pain and blood in stool.  Musculoskeletal: Negative for arthralgias and back pain.  Skin: Negative for rash.  Allergic/Immunologic: Negative for immunocompromised state.  Hematological: Negative for adenopathy. Does not bruise/bleed easily.  Psychiatric/Behavioral: Positive for dysphoric mood and sleep disturbance. Negative for suicidal ideas. The patient is nervous/anxious.     Objective:  BP 112/77 (BP Location: Left Arm, Patient Position: Sitting, Cuff Size: Small)   Pulse 85   Temp 98.3 F (36.8 C) (Oral)   Ht 5\' 4"  (1.626 m)   Wt 164 lb 9.6 oz (74.7 kg)   LMP 08/05/2016   SpO2 98%   BMI 28.25 kg/m   BP/Weight 08/23/2016 04/08/2016 03/02/2016  Systolic BP 112 108 100  Diastolic BP 77 69 68  Wt. (Lbs) 164.6 164.4 159.8  BMI 28.25 28.22 27.43   Orthostatic VS for the past 24 hrs:  BP- Lying Pulse- Lying BP- Sitting Pulse- Sitting BP- Standing at 0 minutes Pulse- Standing at 0 minutes  08/23/16 1219 112/77 80 117/81 86 128/85 85       Physical Exam  Constitutional: She is oriented to person, place, and time. She appears well-developed and well-nourished. No distress.  HENT:  Head: Normocephalic and atraumatic.  Eyes: Conjunctivae and EOM are normal. Pupils are equal, round, and reactive to light.  Cardiovascular: Normal heart sounds.   Pulmonary/Chest: Effort normal.  Musculoskeletal: She exhibits no edema.       Legs: Neurological: She is alert and oriented to person,  place, and time.  Skin: Skin is warm and dry. No rash noted.  Psychiatric: She exhibits a depressed mood.  Tearful during exam    Lab Results  Component Value Date   HGBA1C 5.50 08/17/2015   Lab Results  Component Value Date   HGBA1C 5.6 08/23/2016    Depression screen [facility] 2/9 08/23/2016 04/08/2016 04/08/2016  Decreased Interest 1 1 0  Down, Depressed, Hopeless 0 1 0  PHQ - 2 Score 1 2 0  Altered sleeping 3 1 -  Tired, decreased energy 2 1 -  Change in  appetite 1 1 -  Feeling bad or failure about yourself  0 1 -  Trouble concentrating 0 1 -  Moving slowly or fidgety/restless 0 1 -  Suicidal thoughts 0 1 -  PHQ-9 Score 7 9 -   GAD 7 : Generalized Anxiety Score 08/23/2016 04/08/2016  Nervous, Anxious, on Edge 0 1  Control/stop worrying 0 1  Worry too much - different things 0 1  Trouble relaxing 1 1  Restless 1 1  Easily annoyed or irritable 1 1  Afraid - awful might happen 1 1  Total GAD 7 Score 4 7      Assessment & Plan:  Megan [name] was seen today for depression.  Diagnoses and all orders for this visit:  Polycystic ovaries -     POCT glycosylated hemoglobin (Hb A1C) -     metFORMIN (GLUCOPHAGE XR) 500 MG 24 hr tablet; Take 1 tablet (500 mg total) by mouth daily with breakfast.  Vitamin D insufficiency -     Vitamin D, 25-hydroxy  Irregular menstrual cycle -     metFORMIN (GLUCOPHAGE XR) 500 MG 24 hr tablet; Take 1 tablet (500 mg total) by mouth daily with breakfast.  Mild single current episode of major depressive disorder (HCC) -     escitalopram (LEXAPRO) 10 MG tablet; Take 1 tablet (10 mg total) by mouth daily.   There are no diagnoses linked to this encounter.  No orders of the defined types were placed in this encounter.   Follow-up: Return in about 4 weeks (around 09/20/2016) for depression .   Dessa Phi MD

## 2016-08-23 NOTE — Patient Instructions (Addendum)
Dajanique was seen today for depression.  Diagnoses and all orders for this visit:  Polycystic ovaries -     POCT glycosylated hemoglobin (Hb A1C) -     metFORMIN (GLUCOPHAGE XR) 500 MG 24 hr tablet; Take 1 tablet (500 mg total) by mouth daily with breakfast.  Vitamin D insufficiency -     Vitamin D, 25-hydroxy  Irregular menstrual cycle -     metFORMIN (GLUCOPHAGE XR) 500 MG 24 hr tablet; Take 1 tablet (500 mg total) by mouth daily with breakfast.  Mild single current episode of major depressive disorder (HCC) -     escitalopram (LEXAPRO) 10 MG tablet; Take 1 tablet (10 mg total) by mouth daily.     Please start metformin to help regulate periods in setting of PCOS Please start lexapro for depression   F/u in 2-3 weeks for depression   Dr. Armen Pickup

## 2016-08-24 LAB — VITAMIN D 25 HYDROXY (VIT D DEFICIENCY, FRACTURES): Vit D, 25-Hydroxy: 27 ng/mL — ABNORMAL LOW (ref 30–100)

## 2016-08-25 MED ORDER — VITAMIN D (ERGOCALCIFEROL) 1.25 MG (50000 UNIT) PO CAPS: 50000.0000 [IU] | ORAL_CAPSULE | ORAL | 3 refills | Status: DC

## 2016-08-25 NOTE — Addendum Note (Signed)
Addended by: Dessa Phi on: 08/25/2016 09:26 AM   Modules accepted: Orders

## 2016-08-31 ENCOUNTER — Telehealth: Payer: Self-pay

## 2016-08-31 NOTE — Telephone Encounter (Signed)
Pt was called and informed of lab results and medication being sent to onsite pharmacy. 

## 2016-09-08 ENCOUNTER — Encounter: Payer: Self-pay | Admitting: Family Medicine

## 2016-09-08 ENCOUNTER — Ambulatory Visit: Payer: Self-pay | Attending: Family Medicine | Admitting: Family Medicine

## 2016-09-08 VITALS — BP 122/76 | HR 78 | Temp 98.1°F | Ht 64.0 in | Wt 165.2 lb

## 2016-09-08 DIAGNOSIS — Z0001 Encounter for general adult medical examination with abnormal findings: Secondary | ICD-10-CM | POA: Insufficient documentation

## 2016-09-08 DIAGNOSIS — Z79899 Other long term (current) drug therapy: Secondary | ICD-10-CM | POA: Insufficient documentation

## 2016-09-08 DIAGNOSIS — F320 Major depressive disorder, single episode, mild: Secondary | ICD-10-CM

## 2016-09-08 DIAGNOSIS — F329 Major depressive disorder, single episode, unspecified: Secondary | ICD-10-CM | POA: Insufficient documentation

## 2016-09-08 DIAGNOSIS — E282 Polycystic ovarian syndrome: Secondary | ICD-10-CM | POA: Insufficient documentation

## 2016-09-08 DIAGNOSIS — Z7984 Long term (current) use of oral hypoglycemic drugs: Secondary | ICD-10-CM | POA: Insufficient documentation

## 2016-09-08 NOTE — Patient Instructions (Addendum)
Megan [name] was seen today for depression.  Diagnoses and all orders for this visit:  Mild single current episode of major depressive disorder (HCC)  Polycystic ovaries   To help improve changes of pregnancy Be sure to have sex 12 days after your period, the fertile time is one week  Fertile period is February 25 to March 3rd.   F/u in 6 weeks for depression   Dr. Armen Pickup

## 2016-09-08 NOTE — Progress Notes (Signed)
Subjective:  Patient ID: Megan [name], female    DOB: 26-Nov-1990  Age: 26 y.o. MRN: [id number]  CC: Depression  [place]ese interpreter # [phone]  HPI Charis Taddeo has depression and PCOS she presents for   1. Desires pregnancy: having unprotected sex since January 2017 without pregnancy. Periods are irregular. They occur every other month.  She has two young sons. She is taking metformin. Has polycystic ovaries on last pelvic/TVUS.   2. Depression: improved with lexapro. Still depressed mostly regarding her children. Her elder son is having behavior problems at school. He younger son has been bullied. She feels helpless and upset that she cannot help her sons.   Social History  Substance Use Topics  . Smoking status: Never Smoker  . Smokeless tobacco: Never Used  . Alcohol use No    Outpatient Medications Prior to Visit  Medication Sig Dispense Refill  . escitalopram (LEXAPRO) 10 MG tablet Take 1 tablet (10 mg total) by mouth daily. 30 tablet 2  . metFORMIN (GLUCOPHAGE XR) 500 MG 24 hr tablet Take 1 tablet (500 mg total) by mouth daily with breakfast. 30 tablet 2  . Cholecalciferol (VITAMIN D3) 2000 units TABS Take 2,000 Units by mouth daily. (Patient not taking: Reported on 08/23/2016) 30 tablet 11  . Elastic Bandages & Supports (MEDICAL COMPRESSION STOCKINGS) MISC Knee high compression stockings 25-30 mm Hg (Patient not taking: Reported on 08/23/2016) 2 each 0  . Vitamin D, Ergocalciferol, (DRISDOL) 50000 units CAPS capsule Take 1 capsule (50,000 Units total) by mouth every 30 (thirty) days. 4 capsule 3   No facility-administered medications prior to visit.     ROS Review of Systems  Constitutional: Negative for chills and fever.  Eyes: Negative for visual disturbance.  Respiratory: Negative for shortness of breath.   Cardiovascular: Negative for chest pain.  Gastrointestinal: Negative for abdominal pain and blood in stool.  Musculoskeletal: Negative for arthralgias and  back pain.  Skin: Negative for rash.  Allergic/Immunologic: Negative for immunocompromised state.  Hematological: Negative for adenopathy. Does not bruise/bleed easily.  Psychiatric/Behavioral: Positive for dysphoric mood and sleep disturbance. Negative for suicidal ideas. The patient is nervous/anxious.     Objective:  BP 122/76 (BP Location: Left Arm, Patient Position: Sitting, Cuff Size: Small)   Pulse 78   Temp 98.1 F (36.7 C) (Oral)   Ht 5\' 4"  (1.626 m)   Wt 165 lb 3.2 oz (74.9 kg)   LMP 09/05/2016   SpO2 98%   BMI 28.36 kg/m   BP/Weight 09/08/2016 08/23/2016 04/08/2016  Systolic BP 122 112 108  Diastolic BP 76 77 69  Wt. (Lbs) 165.2 164.6 164.4  BMI 28.36 28.25 28.22   No data found.   Physical Exam  Constitutional: She is oriented to person, place, and time. She appears well-developed and well-nourished. No distress.  HENT:  Head: Normocephalic and atraumatic.  Eyes: Conjunctivae and EOM are normal. Pupils are equal, round, and reactive to light.  Cardiovascular: Normal heart sounds.   Pulmonary/Chest: Effort normal.  Musculoskeletal: She exhibits no edema.       Legs: Neurological: She is alert and oriented to person, place, and time.  Skin: Skin is warm and dry. No rash noted.  Psychiatric: She exhibits a depressed mood.  Tearful during exam    Lab Results  Component Value Date   HGBA1C 5.6 08/23/2016   Lab Results  Component Value Date   HGBA1C 5.6 08/23/2016    Depression screen [facility] 2/9 09/08/2016 08/23/2016 04/08/2016  Decreased Interest  0 1 1  Down, Depressed, Hopeless 1 0 1  PHQ - 2 Score 1 1 2   Altered sleeping 1 3 1   Tired, decreased energy 1 2 1   Change in appetite 0 1 1  Feeling bad or failure about yourself  0 0 1  Trouble concentrating 2 0 1  Moving slowly or fidgety/restless 0 0 1  Suicidal thoughts 0 0 1  PHQ-9 Score 5 7 9    GAD 7 : Generalized Anxiety Score 09/08/2016 08/23/2016 04/08/2016  Nervous, Anxious, on Edge 1 0 1  Control/stop  worrying 0 0 1  Worry too much - different things 0 0 1  Trouble relaxing 1 1 1   Restless 0 1 1  Easily annoyed or irritable 0 1 1  Afraid - awful might happen 0 1 1  Total GAD 7 Score 2 4 7       Assessment & Plan:  Quynn was seen today for depression.  Diagnoses and all orders for this visit:  Mild single current episode of major depressive disorder (HCC)  Polycystic ovaries   There are no diagnoses linked to this encounter.  No orders of the defined types were placed in this encounter.   Follow-up: Return in about 6 weeks (around 10/20/2016) for depression .   09/10/2016 MD

## 2016-09-08 NOTE — Assessment & Plan Note (Signed)
Continue metformin Advised regular sex during fertile periods

## 2016-09-08 NOTE — Assessment & Plan Note (Signed)
Improved with lexapro Advised counseling [facility] [place] Encouraged patient to go to her son's school and speak with school social worker

## 2016-09-21 MED FILL — METFORMIN HCL ER 500 MG TAB: 500 | 30 days supply | Qty: 30 | Fill #1

## 2016-10-19 ENCOUNTER — Ambulatory Visit: Payer: Self-pay | Attending: Family Medicine

## 2016-10-24 MED FILL — VIT D2 1.25 MG (50,000 UNIT: 1.25 MG | 30 days supply | Qty: 4 | Fill #0

## 2016-10-25 ENCOUNTER — Encounter: Payer: Self-pay | Admitting: Family Medicine

## 2016-10-25 ENCOUNTER — Ambulatory Visit: Payer: Self-pay | Attending: Family Medicine | Admitting: Family Medicine

## 2016-10-25 VITALS — BP 112/76 | HR 82 | Temp 98.1°F | Wt 163.2 lb

## 2016-10-25 DIAGNOSIS — E282 Polycystic ovarian syndrome: Secondary | ICD-10-CM | POA: Insufficient documentation

## 2016-10-25 DIAGNOSIS — G8929 Other chronic pain: Secondary | ICD-10-CM | POA: Insufficient documentation

## 2016-10-25 DIAGNOSIS — Z79899 Other long term (current) drug therapy: Secondary | ICD-10-CM | POA: Insufficient documentation

## 2016-10-25 DIAGNOSIS — Z319 Encounter for procreative management, unspecified: Secondary | ICD-10-CM

## 2016-10-25 DIAGNOSIS — M545 Low back pain, unspecified: Secondary | ICD-10-CM

## 2016-10-25 DIAGNOSIS — E559 Vitamin D deficiency, unspecified: Secondary | ICD-10-CM | POA: Insufficient documentation

## 2016-10-25 DIAGNOSIS — F329 Major depressive disorder, single episode, unspecified: Secondary | ICD-10-CM | POA: Insufficient documentation

## 2016-10-25 DIAGNOSIS — Z7984 Long term (current) use of oral hypoglycemic drugs: Secondary | ICD-10-CM | POA: Insufficient documentation

## 2016-10-25 DIAGNOSIS — F320 Major depressive disorder, single episode, mild: Secondary | ICD-10-CM

## 2016-10-25 DIAGNOSIS — N926 Irregular menstruation, unspecified: Secondary | ICD-10-CM | POA: Insufficient documentation

## 2016-10-25 MED ORDER — PRENATAL 27-0.8 MG PO TABS: 1.0000 | ORAL_TABLET | Freq: Every day | ORAL | 11 refills | Status: DC

## 2016-10-25 MED ORDER — ESCITALOPRAM OXALATE 10 MG PO TABS: 10.0000 mg | ORAL_TABLET | Freq: Every day | ORAL | 2 refills | Status: DC

## 2016-10-25 MED ORDER — NAPROXEN 500 MG PO TABS: 500.0000 mg | ORAL_TABLET | Freq: Two times a day (BID) | ORAL | 2 refills | Status: DC | PRN

## 2016-10-25 MED ORDER — VITAMIN D (ERGOCALCIFEROL) 1.25 MG (50000 UNIT) PO CAPS: 50000.0000 [IU] | ORAL_CAPSULE | ORAL | 3 refills | Status: DC

## 2016-10-25 MED ORDER — METFORMIN HCL ER 500 MG PO TB24: 500.0000 mg | ORAL_TABLET | Freq: Every day | ORAL | 2 refills | Status: DC

## 2016-10-25 MED FILL — METFORMIN HCL ER 500 MG TAB: 500 | 30 days supply | Qty: 30 | Fill #0

## 2016-10-25 MED FILL — ?ESCITALOPRAM 10 MG TABLET: 10 | 30 days supply | Qty: 30 | Fill #0

## 2016-10-25 MED FILL — NAPROXEN 500 MG TABLET: 500 | 30 days supply | Qty: 60 | Fill #0

## 2016-10-25 NOTE — Progress Notes (Signed)
Subjective:  Patient ID: Megan [name], female    DOB: 12/25/1990  Age: 26 y.o. MRN: [id number]  CC: Depression  [place] interpreter # [phone] HPI Megan [name] has depression and PCOS she presents for   1. Desires pregnancy: having unprotected sex since January 2017 without pregnancy. Periods are irregular. They occur every other month.  She has two young sons. She is taking metformin. Has polycystic ovaries on last pelvic/TVUS.   2. Depression: improved with lexapro. She reports taking it but not everyday due to associated fatigue. She denies SI. She has started attending school with her children to help with behavior.   3. Low back pain: comes and goes. Pain when standing from sitting position and when rising from bending over. Her pain flared up 2 weeks ago. It has since improved. She applied hot balm that helped.   Social History  Substance Use Topics  . Smoking status: Never Smoker  . Smokeless tobacco: Never Used  . Alcohol use No    Outpatient Medications Prior to Visit  Medication Sig Dispense Refill  . escitalopram (LEXAPRO) 10 MG tablet Take 1 tablet (10 mg total) by mouth daily. 30 tablet 2  . metFORMIN (GLUCOPHAGE XR) 500 MG 24 hr tablet Take 1 tablet (500 mg total) by mouth daily with breakfast. 30 tablet 2  . Vitamin D, Ergocalciferol, (DRISDOL) 50000 units CAPS capsule Take 1 capsule (50,000 Units total) by mouth every 30 (thirty) days. 4 capsule 3  . Elastic Bandages & Supports (MEDICAL COMPRESSION STOCKINGS) MISC Knee high compression stockings 25-30 mm Hg (Patient not taking: Reported on 08/23/2016) 2 each 0   No facility-administered medications prior to visit.     ROS Review of Systems  Constitutional: Negative for chills and fever.  Eyes: Negative for visual disturbance.  Respiratory: Negative for shortness of breath.   Cardiovascular: Negative for chest pain.  Gastrointestinal: Negative for abdominal pain and blood in stool.  Musculoskeletal:  Negative for arthralgias and back pain.  Skin: Negative for rash.  Allergic/Immunologic: Negative for immunocompromised state.  Hematological: Negative for adenopathy. Does not bruise/bleed easily.  Psychiatric/Behavioral: Positive for dysphoric mood and sleep disturbance. Negative for suicidal ideas. The patient is nervous/anxious.     Objective:  BP 112/76   Pulse 82   Temp 98.1 F (36.7 C) (Oral)   Wt 163 lb 3.2 oz (74 kg)   SpO2 98%   BMI 28.01 kg/m   BP/Weight 10/25/2016 09/08/2016 08/23/2016  Systolic BP 112 122 112  Diastolic BP 76 76 77  Wt. (Lbs) 163.2 165.2 164.6  BMI 28.01 28.36 28.25   No data found.   Physical Exam  Constitutional: She is oriented to person, place, and time. She appears well-developed and well-nourished. No distress.  HENT:  Head: Normocephalic and atraumatic.  Eyes: Conjunctivae and EOM are normal. Pupils are equal, round, and reactive to light.  Cardiovascular: Normal heart sounds.   Pulmonary/Chest: Effort normal.  Musculoskeletal: She exhibits no edema.       Legs: Neurological: She is alert and oriented to person, place, and time.  Skin: Skin is warm and dry. No rash noted.  Psychiatric: She exhibits a depressed mood.  Tearful during exam    Lab Results  Component Value Date   HGBA1C 5.6 08/23/2016   Lab Results  Component Value Date   HGBA1C 5.6 08/23/2016    Depression screen [facility] 2/9 10/25/2016 09/08/2016 08/23/2016  Decreased Interest 1 0 1  Down, Depressed, Hopeless 1 1 0  PHQ -  2 Score 2 1 1   Altered sleeping 1 1 3   Tired, decreased energy 1 1 2   Change in appetite 0 0 1  Feeling bad or failure about yourself  1 0 0  Trouble concentrating 3 2 0  Moving slowly or fidgety/restless 0 0 0  Suicidal thoughts 0 0 0  PHQ-9 Score 8 5 7    GAD 7 : Generalized Anxiety Score 10/25/2016 09/08/2016 08/23/2016 04/08/2016  Nervous, Anxious, on Edge 1 1 0 1  Control/stop worrying 1 0 0 1  Worry too much - different things 1 0 0 1  Trouble  relaxing 1 1 1 1   Restless 0 0 1 1  Easily annoyed or irritable 0 0 1 1  Afraid - awful might happen 3 0 1 1  Total GAD 7 Score 7 2 4 7       Assessment & Plan:  Chinaza was seen today for depression.  Diagnoses and all orders for this visit:  Desire for pregnancy -     Prenatal Vit-Fe Fumarate-FA (MULTIVITAMIN-PRENATAL) 27-0.8 MG TABS tablet; Take 1 tablet by mouth daily at 12 noon.  Vitamin D insufficiency -     Vitamin D, Ergocalciferol, (DRISDOL) 50000 units CAPS capsule; Take 1 capsule (50,000 Units total) by mouth every 30 (thirty) days.  Irregular menstrual cycle -     metFORMIN (GLUCOPHAGE XR) 500 MG 24 hr tablet; Take 1 tablet (500 mg total) by mouth daily with breakfast.  Polycystic ovaries -     metFORMIN (GLUCOPHAGE XR) 500 MG 24 hr tablet; Take 1 tablet (500 mg total) by mouth daily with breakfast.  Mild single current episode of major depressive disorder (HCC) -     escitalopram (LEXAPRO) 10 MG tablet; Take 1 tablet (10 mg total) by mouth daily.  Chronic bilateral low back pain without sciatica -     naproxen (NAPROSYN) 500 MG tablet; Take 1 tablet (500 mg total) by mouth 2 (two) times daily as needed for moderate pain. Take with food   There are no diagnoses linked to this encounter.  No orders of the defined types were placed in this encounter.   Follow-up: Return in about 2 months (around 12/25/2016) for depression and low back pain .   09/10/2016 MD

## 2016-10-25 NOTE — Assessment & Plan Note (Signed)
Patient reports improved mood Continue lexapro Decrease to 5 mg nightly for two weeks then increase to 10 mg

## 2016-10-25 NOTE — Assessment & Plan Note (Signed)
Naproxen prn pain with food for low back pain

## 2016-10-25 NOTE — Patient Instructions (Addendum)
Megan [name] was seen today for depression.  Diagnoses and all orders for this visit:  Desire for pregnancy -     Prenatal Vit-Fe Fumarate-FA (MULTIVITAMIN-PRENATAL) 27-0.8 MG TABS tablet; Take 1 tablet by mouth daily at 12 noon.  Vitamin D insufficiency -     Vitamin D, Ergocalciferol, (DRISDOL) 50000 units CAPS capsule; Take 1 capsule (50,000 Units total) by mouth every 30 (thirty) days.  Irregular menstrual cycle -     metFORMIN (GLUCOPHAGE XR) 500 MG 24 hr tablet; Take 1 tablet (500 mg total) by mouth daily with breakfast.  Polycystic ovaries -     metFORMIN (GLUCOPHAGE XR) 500 MG 24 hr tablet; Take 1 tablet (500 mg total) by mouth daily with breakfast.  Mild single current episode of major depressive disorder (HCC) -     escitalopram (LEXAPRO) 10 MG tablet; Take 1 tablet (10 mg total) by mouth daily.  Chronic bilateral low back pain without sciatica -     naproxen (NAPROSYN) 500 MG tablet; Take 1 tablet (500 mg total) by mouth 2 (two) times daily as needed for moderate pain. Take with food   Take 5 mg of lexapro (1/2 pill) for tow weeks to help with fatigue.   f/u in 2 months for depression, low back pain   Dr. Armen Pickup

## 2016-12-07 ENCOUNTER — Encounter: Payer: Self-pay | Admitting: Family Medicine

## 2016-12-26 ENCOUNTER — Encounter: Payer: Self-pay | Admitting: Family Medicine

## 2016-12-26 ENCOUNTER — Ambulatory Visit: Payer: Self-pay | Attending: Family Medicine | Admitting: Family Medicine

## 2016-12-26 ENCOUNTER — Ambulatory Visit: Payer: Self-pay | Admitting: Licensed Clinical Social Worker

## 2016-12-26 VITALS — BP 100/67 | HR 86 | Temp 98.3°F | Wt 160.4 lb

## 2016-12-26 DIAGNOSIS — N926 Irregular menstruation, unspecified: Secondary | ICD-10-CM | POA: Insufficient documentation

## 2016-12-26 DIAGNOSIS — E282 Polycystic ovarian syndrome: Secondary | ICD-10-CM | POA: Insufficient documentation

## 2016-12-26 DIAGNOSIS — F320 Major depressive disorder, single episode, mild: Secondary | ICD-10-CM

## 2016-12-26 DIAGNOSIS — F329 Major depressive disorder, single episode, unspecified: Secondary | ICD-10-CM | POA: Insufficient documentation

## 2016-12-26 DIAGNOSIS — Z7984 Long term (current) use of oral hypoglycemic drugs: Secondary | ICD-10-CM | POA: Insufficient documentation

## 2016-12-26 DIAGNOSIS — M549 Dorsalgia, unspecified: Secondary | ICD-10-CM | POA: Insufficient documentation

## 2016-12-26 LAB — POCT URINALYSIS DIPSTICK
Bilirubin, UA: NEGATIVE
Glucose, UA: NEGATIVE
KETONES UA: NEGATIVE
Leukocytes, UA: NEGATIVE
Nitrite, UA: NEGATIVE
PROTEIN UA: NEGATIVE
RBC UA: NEGATIVE
SPEC GRAV UA: 1.025 (ref 1.010–1.025)
UROBILINOGEN UA: 0.2 U/dL
pH, UA: 5.5 (ref 5.0–8.0)

## 2016-12-26 LAB — POCT URINE PREGNANCY: PREG TEST UR: NEGATIVE

## 2016-12-26 MED ORDER — ESCITALOPRAM OXALATE 10 MG PO TABS
10.0000 mg | ORAL_TABLET | Freq: Every day | ORAL | 2 refills | Status: DC
Start: 2016-12-26 — End: 2017-02-21

## 2016-12-26 MED ORDER — TRAZODONE HCL 50 MG PO TABS: 25.0000 mg | ORAL_TABLET | Freq: Every day | ORAL | 1 refills | Status: DC

## 2016-12-26 NOTE — Patient Instructions (Addendum)
Mahkayla was seen today for back pain and depression.  Diagnoses and all orders for this visit:  Current mild episode of major depressive disorder without prior episode (HCC) -     Ambulatory referral to Psychology -     escitalopram (LEXAPRO) 10 MG tablet; Take 1 tablet (10 mg total) by mouth daily. -     traZODone (DESYREL) 50 MG tablet; Take 0.5 tablets (25 mg total) by mouth at bedtime.  Polycystic ovaries -     POCT urine pregnancy -     POCT urinalysis dipstick  Irregular menstrual cycle -     POCT urine pregnancy -     POCT urinalysis dipstick   STOP metformin due to nausea and frequent periods with abdomina pain   F/u in 6 weeks for depression  Dr. Armen Pickup

## 2016-12-26 NOTE — Progress Notes (Signed)
Subjective:  Patient ID: Megan [name], female    DOB: 16-Oct-1990  Age: 26 y.o. MRN: [id number]  CC: Back Pain and Depression  Anne Hahn interpreter Pyi Morton Peters) # [phone] HPI Laurencia Haralson has depression and PCOS she presents for   1. Depression: she reports disappointing thoughts at times. She reports her husband is seeing another woman. She caught them in the car together. She is worried that her family will be apart. She does not work or speak [place]. She has no other family in the area other than her husband and children. She reports two weeks of feeling like she does not want to do anything. She feel fatigue.  She sleeps from 2 AM to 4 AM. She denies suicidal thoughts and homicidal thoughts. She reports taking lexapro most days of the week but forgetting to take it sometimes. She reports she last took it one week ago.  She takes vitamin D 79024 IU monthly.   2. PCOS: she has polycystic ovaries and irregular periods at baseline. She now takes metformin 500 mg daily. She reports nausea after taking metformin. She reports menstrual bleeding that occurs every 15 days and last 3 days. Periods have become more frequent 1.5 months ago. Her periods previously occurred every other month. Bleeding is very heavy. She has pain in R lower abdomen.   Social History  Substance Use Topics  . Smoking status: Never Smoker  . Smokeless tobacco: Never Used  . Alcohol use No    Outpatient Medications Prior to Visit  Medication Sig Dispense Refill  . escitalopram (LEXAPRO) 10 MG tablet Take 1 tablet (10 mg total) by mouth daily. 30 tablet 2  . metFORMIN (GLUCOPHAGE XR) 500 MG 24 hr tablet Take 1 tablet (500 mg total) by mouth daily with breakfast. 30 tablet 2  . naproxen (NAPROSYN) 500 MG tablet Take 1 tablet (500 mg total) by mouth 2 (two) times daily as needed for moderate pain. Take with food 60 tablet 2  . Prenatal Vit-Fe Fumarate-FA (MULTIVITAMIN-PRENATAL) 27-0.8 MG TABS tablet Take 1 tablet by  mouth daily at 12 noon. 30 each 11  . Vitamin D, Ergocalciferol, (DRISDOL) 50000 units CAPS capsule Take 1 capsule (50,000 Units total) by mouth every 30 (thirty) days. 4 capsule 3  . Elastic Bandages & Supports (MEDICAL COMPRESSION STOCKINGS) MISC Knee high compression stockings 25-30 mm Hg (Patient not taking: Reported on 08/23/2016) 2 each 0   No facility-administered medications prior to visit.     ROS Review of Systems  Constitutional: Negative for chills and fever.  Eyes: Negative for visual disturbance.  Respiratory: Negative for shortness of breath.   Cardiovascular: Negative for chest pain.  Gastrointestinal: Negative for abdominal pain and blood in stool.  Musculoskeletal: Negative for arthralgias and back pain.  Skin: Negative for rash.  Allergic/Immunologic: Negative for immunocompromised state.  Hematological: Negative for adenopathy. Does not bruise/bleed easily.  Psychiatric/Behavioral: Positive for dysphoric mood and sleep disturbance. Negative for suicidal ideas. The patient is nervous/anxious.     Objective:  BP 100/67   Pulse 86   Temp 98.3 F (36.8 C) (Oral)   Wt 160 lb 6.4 oz (72.8 kg)   LMP 12/19/2016   SpO2 97%   BMI 27.53 kg/m   BP/Weight 12/26/2016 10/25/2016 09/08/2016  Systolic BP 100 112 122  Diastolic BP 67 76 76  Wt. (Lbs) 160.4 163.2 165.2  BMI 27.53 28.01 28.36   No data found.  Wt Readings from Last 3 Encounters:  12/26/16 160 lb 6.4 oz (  72.8 kg)  10/25/16 163 lb 3.2 oz (74 kg)  09/08/16 165 lb 3.2 oz (74.9 kg)     Physical Exam  Constitutional: She is oriented to person, place, and time. She appears well-developed and well-nourished. No distress.  HENT:  Head: Normocephalic and atraumatic.  Eyes: Conjunctivae and EOM are normal. Pupils are equal, round, and reactive to light.  Cardiovascular: Normal heart sounds.   Pulmonary/Chest: Effort normal.  Musculoskeletal: She exhibits no edema.  Neurological: She is alert and oriented to  person, place, and time.  Skin: Skin is warm and dry. No rash noted.  Psychiatric: She exhibits a depressed mood.  Tearful during exam    Lab Results  Component Value Date   HGBA1C 5.6 08/23/2016    U preg: negative UA: normal  Depression screen [facility] 2/9 12/26/2016 10/25/2016 09/08/2016  Decreased Interest 1 1 0  Down, Depressed, Hopeless 1 1 1   PHQ - 2 Score 2 2 1   Altered sleeping 0 1 1  Tired, decreased energy 0 1 1  Change in appetite 1 0 0  Feeling bad or failure about yourself  1 1 0  Trouble concentrating 1 3 2   Moving slowly or fidgety/restless 0 0 0  Suicidal thoughts 0 0 0  PHQ-9 Score 5 8 5    GAD 7 : Generalized Anxiety Score 12/26/2016 10/25/2016 09/08/2016 08/23/2016  Nervous, Anxious, on Edge 1 1 1  0  Control/stop worrying 1 1 0 0  Worry too much - different things 1 1 0 0  Trouble relaxing 1 1 1 1   Restless 1 0 0 1  Easily annoyed or irritable 0 0 0 1  Afraid - awful might happen 0 3 0 1  Total GAD 7 Score 5 7 2 4       Assessment & Plan:  Avina was seen today for back pain and depression.  Diagnoses and all orders for this visit:  Current mild episode of major depressive disorder without prior episode (HCC) -     Ambulatory referral to Psychology -     escitalopram (LEXAPRO) 10 MG tablet; Take 1 tablet (10 mg total) by mouth daily. -     traZODone (DESYREL) 50 MG tablet; Take 0.5 tablets (25 mg total) by mouth at bedtime.  Polycystic ovaries -     POCT urine pregnancy -     POCT urinalysis dipstick  Irregular menstrual cycle -     POCT urine pregnancy -     POCT urinalysis dipstick   There are no diagnoses linked to this encounter.  No orders of the defined types were placed in this encounter.   Follow-up: Return in about 6 weeks (around 02/06/2017) for depression.   12/25/2016 MD

## 2016-12-26 NOTE — Assessment & Plan Note (Signed)
She now has polymenorrhea over past 45 days which is a change from oligomorrhea with menses every 48-60 day Negative u preg Normal UA Plan: Stop metformin

## 2016-12-26 NOTE — Assessment & Plan Note (Signed)
Persist Triggers: children having trouble out school, social isolation, infertility of husband  Plan: Continue lexapro, stressed importance of daily use for efficacy Trazodone 25 mg nightly to help with sleep Internal referral to CSW Referral to psychology

## 2016-12-26 NOTE — BH Specialist Note (Signed)
Integrated Behavioral Health Initial Visit  MRN: [id number] Name: Megan [name]   Session Start time: 10:00 AM Session End time: 10:15 AM Total time: 15 minutes  Type of Service: Integrated Behavioral Health- Individual/Family Interpretor:Yes.   Interpretor Name and Language: Darl Pikes SJ#[id number]   Warm Hand Off Completed.       SUBJECTIVE: Megan [name] is a 26 y.o. female accompanied by patient. Patient was referred by Dr. Armen Pickup for depression. Patient reports the following symptoms/concerns: feelings of sadness and worry, difficulty concentrating, and restlessness Duration of problem: 1 year; Severity of problem: mild  OBJECTIVE: Mood: Anxious and Affect: Appropriate Risk of harm to self or others: No plan to harm self or others   LIFE CONTEXT: Family and Social: Pt resides with spouse and two minor children. She has no other form of support School/Work: Pt is financially dependent on spouse. Family receives food stamps ($100) Self-Care: Pt is participating in medication management through PCP. No report of substance use Life Changes: Pt worries about minor children's transition to school. Pt shared youngest child is timid and is bullied by other children.   GOALS ADDRESSED: Patient will reduce symptoms of: anxiety and depression and increase knowledge and/or ability of: coping skills and also: Increase adequate support systems for patient/family and Improve medication compliance   INTERVENTIONS: Solution-Focused Strategies, Supportive Counseling, Psychoeducation and/or Health Education and Link to [facility]  Standardized Assessments completed: PHQ 2&9  ASSESSMENT: Pt currently experiencing depression and anxiety. Patient reports feelings of sadness and worry, difficulty concentrating, and restlessness. She has limited support. Pt may benefit from psychoeducation, psychotherapy, and medication management. LCSWA educated pt on how stress can negatively affect  one's physical and mental health. LCSWA discussed benefits of applying healthy coping skills to decrease symptoms. Pt is open to participating in medication management through PCP. LCSWA encouraged compliance to prescribed medications and provided information on food resources for family.    PLAN: 1. Follow up with behavioral health clinician on : Pt was encouraged to contact LCSWA if symptoms worsen or fail to improve to schedule behavioral appointments at [facility]. 2. Behavioral recommendations: LCSWA recommends that pt apply healthy coping skills discussed, comply with medication management, and utilize provided resources. Pt is encouraged to schedule follow up appointment with LCSWA 3. Referral(s): Community Resources:  Food 4. "From scale of 1-10, how likely are you to follow plan?": 8/10  Bridgett Larsson, LCSW 12/26/16 5:21 PM

## 2017-02-02 ENCOUNTER — Other Ambulatory Visit (HOSPITAL_COMMUNITY)
Admission: RE | Admit: 2017-02-02 | Discharge: 2017-02-02 | Disposition: A | Discharge status: Home / Self Care | Payer: Medicaid Other | Source: Ambulatory Visit | Attending: Family Medicine | Admitting: Family Medicine

## 2017-02-02 ENCOUNTER — Ambulatory Visit: Payer: Self-pay | Attending: Family Medicine | Admitting: Family Medicine

## 2017-02-02 VITALS — BP 112/76 | HR 67 | Temp 97.9°F | Resp 18 | Ht 62.0 in | Wt 159.0 lb

## 2017-02-02 DIAGNOSIS — Z79899 Other long term (current) drug therapy: Secondary | ICD-10-CM | POA: Insufficient documentation

## 2017-02-02 DIAGNOSIS — N946 Dysmenorrhea, unspecified: Secondary | ICD-10-CM | POA: Insufficient documentation

## 2017-02-02 DIAGNOSIS — N926 Irregular menstruation, unspecified: Secondary | ICD-10-CM | POA: Insufficient documentation

## 2017-02-02 DIAGNOSIS — Z319 Encounter for procreative management, unspecified: Secondary | ICD-10-CM | POA: Insufficient documentation

## 2017-02-02 DIAGNOSIS — R1030 Lower abdominal pain, unspecified: Secondary | ICD-10-CM | POA: Insufficient documentation

## 2017-02-02 DIAGNOSIS — N939 Abnormal uterine and vaginal bleeding, unspecified: Secondary | ICD-10-CM

## 2017-02-02 LAB — POCT URINE PREGNANCY: PREG TEST UR: NEGATIVE

## 2017-02-02 MED ORDER — ACETAMINOPHEN 500 MG PO TABS: 1000.0000 mg | ORAL_TABLET | Freq: Three times a day (TID) | ORAL | 0 refills | Status: DC | PRN

## 2017-02-02 NOTE — Progress Notes (Signed)
Patient is here for heavy bleeding & abdominal pain

## 2017-02-02 NOTE — Progress Notes (Signed)
Subjective:  Patient ID: Megan [name], female    DOB: [birth year]  Age: 26 y.o. MRN: [id number]  CC: No chief complaint on file.  Interpreter services used: Zin [id number]  HPIr [name] presents for complains of irregular menses.PMH includes PCOS.  Patient's last menstrual period was in May. She reports history of positive urine pregnancy test on several days ago. She reports bleeding started yesterday at 4 am. Menstrual flow is described as heavy with some clots. Periods are irregular, lasting several days. Dysmenorrhea:mild, occurring throughout menses. Cyclic symptoms include pelvic pain.  Current contraception: none. She reports attempting to get pregnant.     Outpatient Medications Prior to Visit  Medication Sig Dispense Refill  . Elastic Bandages & Supports (MEDICAL COMPRESSION STOCKINGS) MISC Knee high compression stockings 25-30 mm Hg (Patient not taking: Reported on 08/23/2016) 2 each 0  . escitalopram (LEXAPRO) 10 MG tablet Take 1 tablet (10 mg total) by mouth daily. (Patient not taking: Reported on 02/06/2017) 30 tablet 2  . naproxen (NAPROSYN) 500 MG tablet Take 1 tablet (500 mg total) by mouth 2 (two) times daily as needed for moderate pain. Take with food (Patient not taking: Reported on 02/06/2017) 60 tablet 2  . Prenatal Vit-Fe Fumarate-FA (MULTIVITAMIN-PRENATAL) 27-0.8 MG TABS tablet Take 1 tablet by mouth daily at 12 noon. (Patient not taking: Reported on 02/06/2017) 30 each 11  . traZODone (DESYREL) 50 MG tablet Take 0.5 tablets (25 mg total) by mouth at bedtime. (Patient not taking: Reported on 02/06/2017) 30 tablet 1  . Vitamin D, Ergocalciferol, (DRISDOL) 50000 units CAPS capsule Take 1 capsule (50,000 Units total) by mouth every 30 (thirty) days. (Patient not taking: Reported on 02/06/2017) 4 capsule 3   No facility-administered medications prior to visit.     ROS Review of Systems  Constitutional: Negative.   Respiratory: Negative.   Cardiovascular: Negative.     Gastrointestinal: Negative.   Genitourinary: Positive for pelvic pain and vaginal bleeding.  Skin: Negative.   Psychiatric/Behavioral: Negative.     Objective:  BP 112/76 (BP Location: Left Arm, Patient Position: Sitting, Cuff Size: Normal)   Pulse 67   Temp 97.9 F (36.6 C) (Oral)   Resp 18   Ht 5\' 2"  (1.575 m)   Wt 159 lb (72.1 kg)   LMP 02/01/2017 (Exact Date)   SpO2 99%   BMI 29.08 kg/m   BP/Weight 02/06/2017 02/02/2017 12/26/2016  Systolic BP 105 112 100  Diastolic BP 73 76 67  Wt. (Lbs) 159.2 159 160.4  BMI 32.15 29.08 27.53     Physical Exam  Constitutional: She appears well-developed and well-nourished.  Eyes: Pupils are equal, round, and reactive to light. Conjunctivae are normal.  Cardiovascular: Normal rate, regular rhythm, normal heart sounds and intact distal pulses.   Pulmonary/Chest: Effort normal and breath sounds normal.  Abdominal: Soft. Bowel sounds are normal. There is tenderness (pelvic).  Genitourinary: Cervix exhibits discharge (bloody). Vaginal discharge (bloody) found.  Skin: Skin is warm and dry.  Psychiatric: Her mood appears anxious.  Nursing note and vitals reviewed.  Assessment & Plan:   Problem List Items Addressed This Visit    None    Visit Diagnoses    Vaginal bleeding    -  Primary   Urine pregnancy test negative, however patient reports recent positive home test result.    Will obtain serum HCG to confirm   Relevant Orders   Cervicovaginal ancillary only (Completed)   hCG, serum, qualitative (Completed)   Lower abdominal pain  Relevant Medications   acetaminophen (TYLENOL) 500 MG tablet   Other Relevant Orders   POCT urine pregnancy (Completed)   Urine cytology ancillary only (Completed)   Cervicovaginal ancillary only (Completed)   Desire for pregnancy       Relevant Orders   hCG, serum, qualitative (Completed)      Meds ordered this encounter  Medications  . acetaminophen (TYLENOL) 500 MG tablet    Sig: Take 2  tablets (1,000 mg total) by mouth every 8 (eight) hours as needed.    Dispense:  30 tablet    Refill:  0    Order Specific Question:   Supervising Provider    Answer:   Quentin Angst [id number]    Follow-up: Go to the ED if symptoms worsen or fail to improve.   Lizbeth Bark FNP

## 2017-02-03 LAB — CERVICOVAGINAL ANCILLARY ONLY
BACTERIAL VAGINITIS: NEGATIVE
Candida vaginitis: NEGATIVE
Chlamydia: NEGATIVE
NEISSERIA GONORRHEA: NEGATIVE
TRICH (WINDOWPATH): NEGATIVE

## 2017-02-03 LAB — HCG, SERUM, QUALITATIVE: HCG, BETA SUBUNIT, QUAL, SERUM: NEGATIVE m[IU]/mL (ref <6)

## 2017-02-06 ENCOUNTER — Other Ambulatory Visit: Payer: Self-pay | Admitting: Family Medicine

## 2017-02-06 ENCOUNTER — Ambulatory Visit: Payer: Medicaid Other

## 2017-02-06 ENCOUNTER — Telehealth: Payer: Self-pay

## 2017-02-06 ENCOUNTER — Encounter: Payer: Self-pay | Admitting: Family Medicine

## 2017-02-06 ENCOUNTER — Ambulatory Visit: Payer: Self-pay | Attending: Family Medicine | Admitting: Family Medicine

## 2017-02-06 VITALS — BP 105/73 | HR 73 | Temp 98.2°F | Resp 18 | Ht 59.0 in | Wt 159.2 lb

## 2017-02-06 DIAGNOSIS — M25431 Effusion, right wrist: Secondary | ICD-10-CM | POA: Insufficient documentation

## 2017-02-06 DIAGNOSIS — Z8742 Personal history of other diseases of the female genital tract: Secondary | ICD-10-CM

## 2017-02-06 DIAGNOSIS — N939 Abnormal uterine and vaginal bleeding, unspecified: Secondary | ICD-10-CM | POA: Insufficient documentation

## 2017-02-06 DIAGNOSIS — Z79899 Other long term (current) drug therapy: Secondary | ICD-10-CM | POA: Insufficient documentation

## 2017-02-06 DIAGNOSIS — E282 Polycystic ovarian syndrome: Secondary | ICD-10-CM | POA: Insufficient documentation

## 2017-02-06 DIAGNOSIS — N926 Irregular menstruation, unspecified: Secondary | ICD-10-CM

## 2017-02-06 DIAGNOSIS — E559 Vitamin D deficiency, unspecified: Secondary | ICD-10-CM | POA: Insufficient documentation

## 2017-02-06 DIAGNOSIS — R102 Pelvic and perineal pain: Secondary | ICD-10-CM | POA: Insufficient documentation

## 2017-02-06 DIAGNOSIS — F329 Major depressive disorder, single episode, unspecified: Secondary | ICD-10-CM | POA: Insufficient documentation

## 2017-02-06 DIAGNOSIS — M25531 Pain in right wrist: Secondary | ICD-10-CM | POA: Insufficient documentation

## 2017-02-06 DIAGNOSIS — M79641 Pain in right hand: Secondary | ICD-10-CM | POA: Insufficient documentation

## 2017-02-06 DIAGNOSIS — R768 Other specified abnormal immunological findings in serum: Secondary | ICD-10-CM

## 2017-02-06 LAB — URINE CYTOLOGY ANCILLARY ONLY
BACTERIAL VAGINITIS: NEGATIVE
Candida vaginitis: NEGATIVE
Chlamydia: NEGATIVE
Neisseria Gonorrhea: NEGATIVE
Trichomonas: NEGATIVE

## 2017-02-06 MED ORDER — VITAMIN D (ERGOCALCIFEROL) 1.25 MG (50000 UNIT) PO CAPS: 50000.0000 [IU] | ORAL_CAPSULE | ORAL | 3 refills | Status: DC

## 2017-02-06 MED ORDER — PREDNISONE 10 MG PO TABS: ORAL_TABLET | ORAL | 0 refills | Status: DC

## 2017-02-06 NOTE — Telephone Encounter (Signed)
CMA call patient regarding lab results & appt   CMA use interpreter services Name & ID # Megan [name] [phone]  Patient did not answer but interpreter left a VM stating the reason of the call & that she has [facility] [place] [facility] & if have any questions just to call back

## 2017-02-06 NOTE — Assessment & Plan Note (Addendum)
A: acute on set of pain and swelling in R wrist No evidence of infection  No trauma Similar presentation in other joints in the past She has complained of neck and back pain in the past  Possible RA vs  P: Prednisone taper Wrist brace Checking CBC and inflammatory markers

## 2017-02-06 NOTE — Progress Notes (Signed)
Subjective:  Patient ID: Megan [name], female    DOB: 1991/06/19  Age: 26 y.o. MRN: [id number]  CC: Hand Pain  Anne Hahn interpreter Pyi Morton Peters) # [phone] HPI Megan [name] has depression and PCOS she presents for   1. Depression: not taking lexapro or trazodone currently. She refused PHQ-9 and GAD 7 today.   2. Follow up irregular bleeding: She was seen last week on 02/02/17 for irregular vaginal bleeding and pelvic pain. She reports both have stopped 2 days ago. She has negative urinary pregnancy test and beta HCG. She is scheduled for pelvic and transvaginal ultrasound.  3. R wrist pain: started last night. Associated with itching and swelling. She reports the  swelling started fist, then aching, then itching. The pain is in the radial wrist, R shoulder and thumb. Pain is exacerbated by movement of these joints.  Improved with compression. She was at home with the symptoms started. No fever or chills. No preceding trauma. She has several papules on her arms. She reports mosquitoes.   She reports she had similar symptoms in the past. They symptoms started  a little over 5 year ago in her left ulnar wrist in [place]. She reports symptoms started 4 days after she got married. Pain and swelling would last 3-4 weeks at a time. This has occurred about 5-6 times.    Pain also occures at MTP joints of her R hand and in her R shoulder. She reports seeing a doctor.   Social History  Substance Use Topics  . Smoking status: Never Smoker  . Smokeless tobacco: Never Used  . Alcohol use No    Outpatient Medications Prior to Visit  Medication Sig Dispense Refill  . acetaminophen (TYLENOL) 500 MG tablet Take 2 tablets (1,000 mg total) by mouth every 8 (eight) hours as needed. (Patient not taking: Reported on 02/06/2017) 30 tablet 0  . Elastic Bandages & Supports (MEDICAL COMPRESSION STOCKINGS) MISC Knee high compression stockings 25-30 mm Hg (Patient not taking: Reported on 08/23/2016) 2 each 0  .  escitalopram (LEXAPRO) 10 MG tablet Take 1 tablet (10 mg total) by mouth daily. (Patient not taking: Reported on 02/06/2017) 30 tablet 2  . naproxen (NAPROSYN) 500 MG tablet Take 1 tablet (500 mg total) by mouth 2 (two) times daily as needed for moderate pain. Take with food (Patient not taking: Reported on 02/06/2017) 60 tablet 2  . Prenatal Vit-Fe Fumarate-FA (MULTIVITAMIN-PRENATAL) 27-0.8 MG TABS tablet Take 1 tablet by mouth daily at 12 noon. (Patient not taking: Reported on 02/06/2017) 30 each 11  . traZODone (DESYREL) 50 MG tablet Take 0.5 tablets (25 mg total) by mouth at bedtime. (Patient not taking: Reported on 02/06/2017) 30 tablet 1  . Vitamin D, Ergocalciferol, (DRISDOL) 50000 units CAPS capsule Take 1 capsule (50,000 Units total) by mouth every 30 (thirty) days. (Patient not taking: Reported on 02/06/2017) 4 capsule 3   No facility-administered medications prior to visit.     ROS Review of Systems  Constitutional: Negative for chills and fever.  Eyes: Negative for visual disturbance.  Respiratory: Negative for shortness of breath.   Cardiovascular: Negative for chest pain.  Gastrointestinal: Negative for abdominal pain and blood in stool.  Musculoskeletal: Positive for arthralgias and joint swelling. Negative for back pain.  Skin: Negative for rash.  Allergic/Immunologic: Negative for immunocompromised state.  Hematological: Negative for adenopathy. Does not bruise/bleed easily.  Psychiatric/Behavioral: Positive for dysphoric mood and sleep disturbance. Negative for suicidal ideas. The patient is nervous/anxious.  Objective:  BP 105/73 (BP Location: Right Arm, Patient Position: Sitting, Cuff Size: Normal)   Pulse 73   Temp 98.2 F (36.8 C) (Oral)   Resp 18   Ht 4\' 11"  (1.499 m)   Wt 159 lb 3.2 oz (72.2 kg)   LMP 02/01/2017 (Exact Date)   SpO2 99%   BMI 32.15 kg/m   BP/Weight 02/06/2017 02/02/2017 12/26/2016  Systolic BP 105 112 100  Diastolic BP 73 76 67  Wt. (Lbs) 159.2  159 160.4  BMI 32.15 29.08 27.53   No data found.  Wt Readings from Last 3 Encounters:  02/06/17 159 lb 3.2 oz (72.2 kg)  02/02/17 159 lb (72.1 kg)  12/26/16 160 lb 6.4 oz (72.8 kg)     Physical Exam  Constitutional: She is oriented to person, place, and time. She appears well-developed and well-nourished. No distress.  She is scratching her right wrist during the exam   HENT:  Head: Normocephalic and atraumatic.  Mouth/Throat: Oropharynx is clear and moist.  Eyes: Pupils are equal, round, and reactive to light. Conjunctivae and EOM are normal.  Cardiovascular: Normal heart sounds.   Pulmonary/Chest: Effort normal.  Musculoskeletal: She exhibits no edema.       Right shoulder: She exhibits pain, abnormal pulse and decreased strength. She exhibits normal range of motion, no tenderness, no bony tenderness, no swelling, no effusion, no crepitus, no deformity, no laceration and no spasm.       Right wrist: She exhibits tenderness and swelling. She exhibits normal range of motion, no bony tenderness, no effusion, no crepitus, no deformity and no laceration.       Arms: Lymphadenopathy:    She has no axillary adenopathy.  Neurological: She is alert and oriented to person, place, and time.  Skin: Skin is warm and dry. Rash noted. Rash is papular.     Psychiatric: She exhibits a depressed mood.  Tearful during exam    Lab Results  Component Value Date   HGBA1C 5.6 08/23/2016    Depression screen [facility] 2/9 12/26/2016 10/25/2016 09/08/2016  Decreased Interest 1 1 0  Down, Depressed, Hopeless 1 1 1   PHQ - 2 Score 2 2 1   Altered sleeping 0 1 1  Tired, decreased energy 0 1 1  Change in appetite 1 0 0  Feeling bad or failure about yourself  1 1 0  Trouble concentrating 1 3 2   Moving slowly or fidgety/restless 0 0 0  Suicidal thoughts 0 0 0  PHQ-9 Score 5 8 5    GAD 7 : Generalized Anxiety Score 12/26/2016 10/25/2016 09/08/2016 08/23/2016  Nervous, Anxious, on Edge 1 1 1  0  Control/stop  worrying 1 1 0 0  Worry too much - different things 1 1 0 0  Trouble relaxing 1 1 1 1   Restless 1 0 0 1  Easily annoyed or irritable 0 0 0 1  Afraid - awful might happen 0 3 0 1  Total GAD 7 Score 5 7 2 4       Assessment & Plan:  Arpi was seen today for hand pain.  Diagnoses and all orders for this visit:  Pain and swelling of right wrist -     Uric Acid -     C-reactive protein -     Sedimentation rate -     ANA w/Reflex -     Rheumatoid factor -     predniSONE (DELTASONE) 10 MG tablet; Take by mouth 4 tablets for 2 days, 3 tablets for 2  days, 2 tablets for 2 days, 1 tablet for 3 days then STOP -     CBC  Vitamin D insufficiency -     Vitamin D, Ergocalciferol, (DRISDOL) 50000 units CAPS capsule; Take 1 capsule (50,000 Units total) by mouth every 30 (thirty) days.   There are no diagnoses linked to this encounter.  No orders of the defined types were placed in this encounter.   Follow-up: Return in about 2 weeks (around 02/20/2017) for right shoulder and R wrist pain .   Dessa Phi MD

## 2017-02-06 NOTE — Patient Instructions (Addendum)
Samariah was seen today for hand pain.  Diagnoses and all orders for this visit:  Pain and swelling of right wrist -     Uric Acid -     C-reactive protein -     Sedimentation rate -     ANA w/Reflex -     Rheumatoid factor -     predniSONE (DELTASONE) 10 MG tablet; Take by mouth 4 tablets for 2 days, 3 tablets for 2 days, 2 tablets for 2 days, 1 tablet for 3 days then STOP -     CBC  Vitamin D insufficiency -     Vitamin D, Ergocalciferol, (DRISDOL) 50000 units CAPS capsule; Take 1 capsule (50,000 Units total) by mouth every 30 (thirty) days.   Please take prednisone taper for pain, swelling and itching in R wrist  Please be sure to keep ultrasound appointment  You will be contacted with lab results  F/u in 2 weeks for Right wrist and shoulder pain   Dr. Armen Pickup

## 2017-02-06 NOTE — Telephone Encounter (Signed)
-----   Message from Lizbeth Bark, FNP sent at 02/06/2017  9:11 AM EDT ----- -Pregnancy blood test result is negative. -Gonorrhea, Chlamydia, BV, Yeast, and Trichomonas were all negative. -Due to history of polycystic ovarian syndrome it can cause irregular menstrual periods. -Recommend scheduling ultrasound imaging to evaluate.

## 2017-02-07 LAB — CBC
HEMATOCRIT: 41.9 % (ref 34.0–46.6)
Hemoglobin: 13.7 g/dL (ref 11.1–15.9)
MCH: 27.6 pg (ref 26.6–33.0)
MCHC: 32.7 g/dL (ref 31.5–35.7)
MCV: 84 fL (ref 79–97)
PLATELETS: 336 10*3/uL (ref 150–379)
RBC: 4.97 x10E6/uL (ref 3.77–5.28)
RDW: 13.8 % (ref 12.3–15.4)
WBC: 9.6 10*3/uL (ref 3.4–10.8)

## 2017-02-07 LAB — SEDIMENTATION RATE: SED RATE: 21 mm/h (ref 0–32)

## 2017-02-07 LAB — C-REACTIVE PROTEIN: CRP: 2.2 mg/L (ref 0.0–4.9)

## 2017-02-07 LAB — ANA W/REFLEX: Anti Nuclear Antibody(ANA): NEGATIVE

## 2017-02-07 LAB — RHEUMATOID FACTOR: Rhuematoid fact SerPl-aCnc: 19.7 IU/mL — ABNORMAL HIGH (ref 0.0–13.9)

## 2017-02-07 LAB — URIC ACID: URIC ACID: 4.2 mg/dL (ref 2.5–7.1)

## 2017-02-07 NOTE — Addendum Note (Signed)
Addended by: Dessa Phi on: 02/07/2017 11:38 PM   Modules accepted: Orders

## 2017-02-10 ENCOUNTER — Ambulatory Visit (HOSPITAL_COMMUNITY)
Admission: RE | Admit: 2017-02-10 | Discharge: 2017-02-10 | Disposition: A | Discharge status: Home / Self Care | Payer: Self-pay | Source: Ambulatory Visit | Attending: Family Medicine | Admitting: Family Medicine

## 2017-02-10 DIAGNOSIS — Z8742 Personal history of other diseases of the female genital tract: Secondary | ICD-10-CM | POA: Insufficient documentation

## 2017-02-10 DIAGNOSIS — R102 Pelvic and perineal pain: Secondary | ICD-10-CM | POA: Insufficient documentation

## 2017-02-10 DIAGNOSIS — N926 Irregular menstruation, unspecified: Secondary | ICD-10-CM | POA: Insufficient documentation

## 2017-02-10 LAB — SPECIMEN STATUS REPORT

## 2017-02-13 DIAGNOSIS — R768 Other specified abnormal immunological findings in serum: Secondary | ICD-10-CM | POA: Insufficient documentation

## 2017-02-15 ENCOUNTER — Telehealth: Payer: Self-pay

## 2017-02-15 LAB — RA QN+CCP(IGG/A)+SJOSSA+SJOSSB
Cyclic Citrullin Peptide Ab: 9 units (ref 0–19)
Rhuematoid fact SerPl-aCnc: 20.6 IU/mL — ABNORMAL HIGH (ref 0.0–13.9)

## 2017-02-15 LAB — SPECIMEN STATUS REPORT

## 2017-02-15 NOTE — Telephone Encounter (Signed)
Pt was called and informed of lab results. 

## 2017-02-21 ENCOUNTER — Encounter: Payer: Self-pay | Admitting: Family Medicine

## 2017-02-21 ENCOUNTER — Ambulatory Visit: Payer: Self-pay | Attending: Family Medicine | Admitting: Family Medicine

## 2017-02-21 VITALS — BP 111/73 | HR 83 | Temp 98.1°F | Ht 59.0 in | Wt 159.4 lb

## 2017-02-21 DIAGNOSIS — M25431 Effusion, right wrist: Secondary | ICD-10-CM

## 2017-02-21 DIAGNOSIS — M25531 Pain in right wrist: Secondary | ICD-10-CM | POA: Insufficient documentation

## 2017-02-21 DIAGNOSIS — M7989 Other specified soft tissue disorders: Secondary | ICD-10-CM | POA: Insufficient documentation

## 2017-02-21 DIAGNOSIS — Z79899 Other long term (current) drug therapy: Secondary | ICD-10-CM | POA: Insufficient documentation

## 2017-02-21 DIAGNOSIS — E282 Polycystic ovarian syndrome: Secondary | ICD-10-CM | POA: Insufficient documentation

## 2017-02-21 DIAGNOSIS — F329 Major depressive disorder, single episode, unspecified: Secondary | ICD-10-CM | POA: Insufficient documentation

## 2017-02-21 DIAGNOSIS — R768 Other specified abnormal immunological findings in serum: Secondary | ICD-10-CM

## 2017-02-21 NOTE — Patient Instructions (Addendum)
Diagnoses and all orders for this visit:  Polycystic ovaries -     Ambulatory referral to Obstetrics / Gynecology  Pain and swelling of right wrist  Swelling and pain resolved You have been referred to rheumatology   F/u in 8 weeks for flu shot/meet new PCP  Dr. Armen Pickup

## 2017-02-21 NOTE — Progress Notes (Signed)
Subjective:  Patient ID: Megan [name], female    DOB: 27-Feb-1991  Age: 26 y.o. MRN: [id number]  CC: No chief complaint on file.  Anne Hahn interpreter Darl Pikes ID # [id number] HPI Arianni Heidt has depression and PCOS she presents for    1. R wrist pain: started 02/05/17.  This was associated with itching and swelling. She reports the  swelling started fist, then aching, then itching. The pain is in the radial wrist, R shoulder and thumb. Pain is exacerbated by movement of these joints.  Improved with compression. She was at home with the symptoms started. No fever or chills. No preceding trauma. She has several papules on her arms. She reports mosquitoes.   She reports she had similar symptoms in the past. They symptoms started  a little over 5 year ago in her left ulnar wrist in [place]. She reports symptoms started 4 days after she got married. Pain and swelling would last 3-4 weeks at a time. This has occurred about 5-6 times.    Pain also occures at MTP joints of her R hand and in her R shoulder. She reports seeing a doctor.   She did not take prednisone or complete the x-ray of her R hand or wrist. She reports the pain resolved after 3 days. She has no pain or swelling now. Laboratory evaluation revealed slightly elevated rheumatoid factor, otherwise normal.    2. PCOS: she is not taking metformin. She desires another pregnancy. She has irregular periods. Her last pelvic ultrasound on 02/10/2017 revealed the following: IMPRESSION: Numerous follicles throughout both ovaries without dominant mass.  Otherwise negative exam.   Social History  Substance Use Topics  . Smoking status: Never Smoker  . Smokeless tobacco: Never Used  . Alcohol use No    Outpatient Medications Prior to Visit  Medication Sig Dispense Refill  . traZODone (DESYREL) 50 MG tablet Take 0.5 tablets (25 mg total) by mouth at bedtime. 30 tablet 1  . acetaminophen (TYLENOL) 500 MG tablet Take 2 tablets (1,000 mg  total) by mouth every 8 (eight) hours as needed. (Patient not taking: Reported on 02/06/2017) 30 tablet 0  . Elastic Bandages & Supports (MEDICAL COMPRESSION STOCKINGS) MISC Knee high compression stockings 25-30 mm Hg (Patient not taking: Reported on 08/23/2016) 2 each 0  . escitalopram (LEXAPRO) 10 MG tablet Take 1 tablet (10 mg total) by mouth daily. (Patient not taking: Reported on 02/06/2017) 30 tablet 2  . naproxen (NAPROSYN) 500 MG tablet Take 1 tablet (500 mg total) by mouth 2 (two) times daily as needed for moderate pain. Take with food (Patient not taking: Reported on 02/06/2017) 60 tablet 2  . predniSONE (DELTASONE) 10 MG tablet Take by mouth 4 tablets for 2 days, 3 tablets for 2 days, 2 tablets for 2 days, 1 tablet for 3 days then STOP (Patient not taking: Reported on 02/21/2017) 21 tablet 0  . Prenatal Vit-Fe Fumarate-FA (MULTIVITAMIN-PRENATAL) 27-0.8 MG TABS tablet Take 1 tablet by mouth daily at 12 noon. (Patient not taking: Reported on 02/06/2017) 30 each 11  . Vitamin D, Ergocalciferol, (DRISDOL) 50000 units CAPS capsule Take 1 capsule (50,000 Units total) by mouth every 30 (thirty) days. (Patient not taking: Reported on 02/21/2017) 4 capsule 3   No facility-administered medications prior to visit.     ROS Review of Systems  Constitutional: Negative for chills and fever.  Eyes: Negative for visual disturbance.  Respiratory: Negative for shortness of breath.   Cardiovascular: Negative for chest pain.  Gastrointestinal: Negative  for abdominal pain and blood in stool.  Genitourinary: Positive for menstrual problem.  Musculoskeletal: Negative for arthralgias, back pain and joint swelling.  Skin: Negative for rash.  Allergic/Immunologic: Negative for immunocompromised state.  Hematological: Negative for adenopathy. Does not bruise/bleed easily.  Psychiatric/Behavioral: Positive for dysphoric mood and sleep disturbance. Negative for suicidal ideas. The patient is nervous/anxious.      Objective:  BP 111/73   Pulse 83   Temp 98.1 F (36.7 C) (Oral)   Ht 4\' 11"  (1.499 m)   Wt 159 lb 6.4 oz (72.3 kg)   LMP 02/01/2017 (Exact Date)   SpO2 98%   BMI 32.19 kg/m   BP/Weight 02/21/2017 02/06/2017 02/02/2017  Systolic BP 111 105 112  Diastolic BP 73 73 76  Wt. (Lbs) 159.4 159.2 159  BMI 32.19 32.15 29.08    Wt Readings from Last 3 Encounters:  02/21/17 159 lb 6.4 oz (72.3 kg)  02/02/17 159 lb (72.1 kg)  12/26/16 160 lb 6.4 oz (72.8 kg)    Physical Exam  Constitutional: She is oriented to person, place, and time. She appears well-developed and well-nourished. No distress.  She is scratching her right wrist during the exam   HENT:  Head: Normocephalic and atraumatic.  Mouth/Throat: Oropharynx is clear and moist.  Eyes: Pupils are equal, round, and reactive to light. Conjunctivae and EOM are normal.  Cardiovascular: Normal heart sounds.   Pulmonary/Chest: Effort normal.  Musculoskeletal: She exhibits no edema.       Right shoulder: She exhibits normal range of motion, no tenderness, no bony tenderness, no swelling, no effusion, no crepitus, no deformity, no laceration, no pain and normal strength.       Right wrist: She exhibits normal range of motion, no tenderness, no bony tenderness, no swelling, no effusion, no crepitus, no deformity and no laceration.  Lymphadenopathy:    She has no axillary adenopathy.  Neurological: She is alert and oriented to person, place, and time.  Skin: Skin is warm and dry.  Psychiatric: She exhibits a depressed mood.   Lab Results  Component Value Date   HGBA1C 5.6 08/23/2016   Depression screen [facility] 2/9 12/26/2016 10/25/2016 09/08/2016  Decreased Interest 1 1 0  Down, Depressed, Hopeless 1 1 1   PHQ - 2 Score 2 2 1   Altered sleeping 0 1 1  Tired, decreased energy 0 1 1  Change in appetite 1 0 0  Feeling bad or failure about yourself  1 1 0  Trouble concentrating 1 3 2   Moving slowly or fidgety/restless 0 0 0  Suicidal  thoughts 0 0 0  PHQ-9 Score 5 8 5    GAD 7 : Generalized Anxiety Score 12/26/2016 10/25/2016 09/08/2016 08/23/2016  Nervous, Anxious, on Edge 1 1 1  0  Control/stop worrying 1 1 0 0  Worry too much - different things 1 1 0 0  Trouble relaxing 1 1 1 1   Restless 1 0 0 1  Easily annoyed or irritable 0 0 0 1  Afraid - awful might happen 0 3 0 1  Total GAD 7 Score 5 7 2 4       Assessment & Plan:  Journi was seen today for joint swelling.  Diagnoses and all orders for this visit:  Polycystic ovaries -     Ambulatory referral to Obstetrics / Gynecology  Pain and swelling of right wrist   There are no diagnoses linked to this encounter.  No orders of the defined types were placed in this encounter.  Follow-up: Return in about 8 weeks (around 04/18/2017) for flu shot, meet new PCP.   Dessa Phi MD

## 2017-02-27 NOTE — Assessment & Plan Note (Signed)
Acute pain and swelling has resolved without prednisone

## 2017-02-27 NOTE — Assessment & Plan Note (Signed)
Ob gynecology referral placed

## 2017-02-27 NOTE — Assessment & Plan Note (Signed)
Rheumatoid factor elevated with intermittent arthritis X-ray of wrist ordered Rheumatology referral placed

## 2017-03-06 ENCOUNTER — Telehealth: Payer: Self-pay

## 2017-03-06 NOTE — Telephone Encounter (Signed)
CMA call regarding lab results  Patient needs interpreter ID & name  [phone]  [facility]

## 2017-03-06 NOTE — Telephone Encounter (Signed)
-----   Message from Lizbeth Bark, FNP sent at 02/14/2017  3:18 PM EDT ----- Imaging studies showed multiple follicles. This can cause development of ovarian cysts. This is common with polycystic ovarian syndrome which can cause irregular menstrual bleeding and pelvic pain. No mass, no uterine fibroid present.

## 2017-03-17 ENCOUNTER — Encounter: Payer: Self-pay | Admitting: Obstetrics & Gynecology

## 2017-04-11 ENCOUNTER — Other Ambulatory Visit: Payer: Self-pay | Admitting: Family Medicine

## 2017-04-18 ENCOUNTER — Encounter: Payer: Self-pay | Admitting: Family Medicine

## 2017-04-18 ENCOUNTER — Ambulatory Visit: Payer: Self-pay | Attending: Family Medicine | Admitting: Family Medicine

## 2017-04-18 VITALS — BP 92/62 | HR 70 | Temp 98.5°F | Resp 18 | Ht 62.0 in | Wt 163.8 lb

## 2017-04-18 DIAGNOSIS — M2550 Pain in unspecified joint: Secondary | ICD-10-CM | POA: Insufficient documentation

## 2017-04-18 DIAGNOSIS — Z79899 Other long term (current) drug therapy: Secondary | ICD-10-CM | POA: Insufficient documentation

## 2017-04-18 DIAGNOSIS — Z319 Encounter for procreative management, unspecified: Secondary | ICD-10-CM

## 2017-04-18 DIAGNOSIS — E282 Polycystic ovarian syndrome: Secondary | ICD-10-CM | POA: Insufficient documentation

## 2017-04-18 MED ORDER — INDOMETHACIN 25 MG PO CAPS: ORAL_CAPSULE | ORAL | 1 refills | Status: DC

## 2017-04-18 NOTE — Progress Notes (Signed)
   Subjective:  Patient ID: Megan [name], female    DOB: 1990/11/03  Age: 26 y.o. MRN: [id number]  CC: Follow-up   HPI Megan [name] presents for follow up. Interpreter services used.  She complaints of chronic arthralgias to the bilateral arms and legs. She was seen in office in July for similar symptoms. She reports aching. Symptoms aggravated by cold weather. RA labs obtained and rheumatoid factor was shown to be slightly elevated, was xray ordered and  referral to rheumatology placed. She reports not following up with referral or x-rays. She reports desiring pregnancy. PMH of irregular menses, referred for imaging and ovaries showed numerous follicles with no mass, PCOS confirmed.   Outpatient Medications Prior to Visit  Medication Sig Dispense Refill  . Prenatal Vit-Fe Fumarate-FA (MULTIVITAMIN-PRENATAL) 27-0.8 MG TABS tablet Take 1 tablet by mouth daily at 12 noon. (Patient not taking: Reported on 02/06/2017) 30 each 11  . traZODone (DESYREL) 50 MG tablet Take 0.5 tablets (25 mg total) by mouth at bedtime. 30 tablet 1  . Vitamin D, Ergocalciferol, (DRISDOL) 50000 units CAPS capsule Take 1 capsule (50,000 Units total) by mouth every 30 (thirty) days. (Patient not taking: Reported on 02/21/2017) 4 capsule 3   No facility-administered medications prior to visit.     ROS Review of Systems  Constitutional: Negative.   Respiratory: Negative.   Cardiovascular: Negative.   Genitourinary: Negative.   Musculoskeletal: Positive for arthralgias.     Objective:  BP 92/62 (BP Location: Left Arm, Patient Position: Sitting, Cuff Size: Normal)   Pulse 70   Temp 98.5 F (36.9 C) (Oral)   Resp 18   Ht 5\' 2"  (1.575 m)   Wt 163 lb 12.8 oz (74.3 kg)   SpO2 99%   BMI 29.96 kg/m   BP/Weight 04/18/2017 02/21/2017 02/06/2017  Systolic BP 92 111 105  Diastolic BP 62 73 73  Wt. (Lbs) 163.8 159.4 159.2  BMI 29.96 32.19 32.15     Physical Exam  Constitutional: She appears well-developed and  well-nourished.  Cardiovascular: Normal rate, regular rhythm, normal heart sounds and intact distal pulses.   Pulmonary/Chest: Effort normal and breath sounds normal.  Abdominal: Soft. Bowel sounds are normal.  Musculoskeletal:  Bilateral upper and lower pain of the joints with movement.  Skin: Skin is warm and dry.  Nursing note and vitals reviewed.    Assessment & Plan:   1. Arthralgia, unspecified joint History of + RA factor, will refer again to rheumatology. - indomethacin (INDOCIN) 25 MG capsule; TAKE ONE CAPSULE BY MOUTH TWICE DAILY AS NEEDED WITH FOOD FOR PAIN.  Dispense: 60 capsule; Refill: 1 - Ambulatory referral to Rheumatology  2. Polycystic ovaries  - Ambulatory referral to Gynecology  3. Desire for pregnancy  - Ambulatory referral to Gynecology   Meds ordered this encounter  Medications  . indomethacin (INDOCIN) 25 MG capsule    Sig: TAKE ONE CAPSULE BY MOUTH TWICE DAILY AS NEEDED WITH FOOD FOR PAIN.    Dispense:  60 capsule    Refill:  1    Order Specific Question:   Supervising Provider    Answer:   02/08/2017 Quentin Angst    Follow-up: Return in about 2 months (around 06/18/2017) for Arthralgia.   06/20/2017 FNP

## 2017-04-18 NOTE — Patient Instructions (Addendum)
Apply for orange card to complete referral process.

## 2017-04-18 NOTE — Progress Notes (Signed)
Patient is here for f/up  

## 2017-04-24 ENCOUNTER — Encounter: Payer: Medicaid Other | Admitting: Obstetrics & Gynecology

## 2017-04-24 ENCOUNTER — Encounter: Payer: Self-pay | Admitting: Obstetrics & Gynecology

## 2017-06-19 ENCOUNTER — Encounter: Payer: Self-pay | Admitting: Family Medicine

## 2017-06-19 ENCOUNTER — Encounter: Payer: Self-pay | Admitting: Obstetrics and Gynecology

## 2017-06-19 ENCOUNTER — Ambulatory Visit: Payer: Self-pay | Attending: Family Medicine | Admitting: Family Medicine

## 2017-06-19 VITALS — BP 94/62 | HR 74 | Temp 98.0°F | Resp 18 | Ht 62.0 in | Wt 161.2 lb

## 2017-06-19 DIAGNOSIS — Z79899 Other long term (current) drug therapy: Secondary | ICD-10-CM | POA: Insufficient documentation

## 2017-06-19 DIAGNOSIS — Z09 Encounter for follow-up examination after completed treatment for conditions other than malignant neoplasm: Secondary | ICD-10-CM

## 2017-06-19 DIAGNOSIS — M2550 Pain in unspecified joint: Secondary | ICD-10-CM

## 2017-06-19 DIAGNOSIS — R768 Other specified abnormal immunological findings in serum: Secondary | ICD-10-CM

## 2017-06-19 DIAGNOSIS — E282 Polycystic ovarian syndrome: Secondary | ICD-10-CM | POA: Insufficient documentation

## 2017-06-19 DIAGNOSIS — M069 Rheumatoid arthritis, unspecified: Secondary | ICD-10-CM | POA: Insufficient documentation

## 2017-06-19 DIAGNOSIS — R7689 Other specified abnormal immunological findings in serum: Secondary | ICD-10-CM

## 2017-06-19 DIAGNOSIS — R29898 Other symptoms and signs involving the musculoskeletal system: Secondary | ICD-10-CM

## 2017-06-19 MED ORDER — INDOMETHACIN 25 MG PO CAPS: ORAL_CAPSULE | ORAL | 1 refills | Status: DC

## 2017-06-19 MED FILL — INDOMETHACIN 25 MG CAPSULE: 25 | 30 days supply | Qty: 60 | Fill #0

## 2017-06-19 NOTE — Progress Notes (Signed)
Patient is here for f/up   Patient wants to discuss to pcp that patient wants to have a baby

## 2017-06-19 NOTE — Progress Notes (Signed)
   Subjective:  Patient ID: Megan [name], female    DOB: [birth year]  Age: 26 y.o. MRN: [id number]  CC: Follow-up   HPI Megan [name] presents for follow up. Interpreter services used.  History of chronic arthralgias to the bilateral arms and legs. Symptoms aggravated by cold weather. RA labs obtained and rheumatoid factor was shown to be slightly elevated, was xray ordered and  referral to rheumatology placed. She reports not following up with referral or x-rays. She reports desiring pregnancy. PMH of irregular menses, referred for imaging and ovaries showed numerous follicles with no mass, PCOS confirmed. She reports upcoming appointment with gynecology in December.  Outpatient Medications Prior to Visit  Medication Sig Dispense Refill  . Prenatal Vit-Fe Fumarate-FA (MULTIVITAMIN-PRENATAL) 27-0.8 MG TABS tablet Take 1 tablet by mouth daily at 12 noon. (Patient not taking: Reported on 02/06/2017) 30 each 11  . traZODone (DESYREL) 50 MG tablet Take 0.5 tablets (25 mg total) by mouth at bedtime. 30 tablet 1  . Vitamin D, Ergocalciferol, (DRISDOL) 50000 units CAPS capsule Take 1 capsule (50,000 Units total) by mouth every 30 (thirty) days. (Patient not taking: Reported on 02/21/2017) 4 capsule 3  . indomethacin (INDOCIN) 25 MG capsule TAKE ONE CAPSULE BY MOUTH TWICE DAILY AS NEEDED WITH FOOD FOR PAIN. 60 capsule 1   No facility-administered medications prior to visit.     ROS Review of Systems  Constitutional: Negative.   Respiratory: Negative.   Cardiovascular: Negative.   Genitourinary: Negative.   Musculoskeletal: Positive for arthralgias.     Objective:  BP 94/62 (BP Location: Left Arm, Patient Position: Sitting, Cuff Size: Normal)   Pulse 74   Temp 98 F (36.7 C) (Oral)   Resp 18   Ht 5\' 2"  (1.575 m)   Wt 161 lb 3.2 oz (73.1 kg)   SpO2 98%   BMI 29.48 kg/m   BP/Weight 06/19/2017 04/18/2017 02/21/2017  Systolic BP 94 92 111  Diastolic BP 62 62 73  Wt. (Lbs) 161.2 163.8 159.4   BMI 29.48 29.96 32.19     Physical Exam  Constitutional: She appears well-developed and well-nourished.  Cardiovascular: Normal rate, regular rhythm, normal heart sounds and intact distal pulses.  Pulmonary/Chest: Effort normal and breath sounds normal.  Abdominal: Soft. Bowel sounds are normal.  Musculoskeletal:       Right hand: Decreased strength (4/5 motor strength with [name]) noted.       Left hand: Decreased strength (4/5 motor strength with [name]) noted.  Bilateral upper and lower pain of the joints with movement.  Skin: Skin is warm and dry.  Nursing note and vitals reviewed.    Assessment & Plan:   1. Follow up Encouraged pt to f/u with upcoming gyn appt.  2. Arthralgia, unspecified joint  - indomethacin (INDOCIN) 25 MG capsule; TAKE ONE CAPSULE BY MOUTH TWICE DAILY AS NEEDED WITH FOOD FOR PAIN.  Dispense: 60 capsule; Refill: 1  3. Decreased [name] strength of left hand  - Ambulatory referral to Rheumatology  4. Decreased [name] strength of right hand  - Ambulatory referral to Rheumatology  5. Elevated rheumatoid factor History of + RA factor, will refer again to rheumatology. - Ambulatory referral to Rheumatology     Follow-up: Return As needed.   02/23/2017 FNP

## 2017-06-26 ENCOUNTER — Encounter: Payer: Self-pay | Admitting: Obstetrics & Gynecology

## 2017-06-26 ENCOUNTER — Other Ambulatory Visit (HOSPITAL_COMMUNITY)
Admission: RE | Admit: 2017-06-26 | Discharge: 2017-06-26 | Disposition: A | Discharge status: Home / Self Care | Payer: Medicaid Other | Source: Ambulatory Visit | Attending: Obstetrics & Gynecology | Admitting: Obstetrics & Gynecology

## 2017-06-26 ENCOUNTER — Ambulatory Visit (INDEPENDENT_AMBULATORY_CARE_PROVIDER_SITE_OTHER): Payer: Self-pay | Admitting: Obstetrics & Gynecology

## 2017-06-26 VITALS — BP 121/88 | HR 80 | Ht 62.0 in | Wt 159.0 lb

## 2017-06-26 DIAGNOSIS — Z3202 Encounter for pregnancy test, result negative: Secondary | ICD-10-CM

## 2017-06-26 DIAGNOSIS — E282 Polycystic ovarian syndrome: Secondary | ICD-10-CM

## 2017-06-26 DIAGNOSIS — N898 Other specified noninflammatory disorders of vagina: Secondary | ICD-10-CM

## 2017-06-26 LAB — POCT PREGNANCY, URINE: PREG TEST UR: NEGATIVE

## 2017-06-26 MED ORDER — METFORMIN HCL 500 MG PO TABS: ORAL_TABLET | ORAL | 4 refills | Status: DC

## 2017-06-26 MED FILL — ?METFORMIN HCL 500MG TABLET: 500 | 30 days supply | Qty: 120 | Fill #0

## 2017-06-26 NOTE — Progress Notes (Signed)
Patient ID: Megan [name], female   DOB: 1990-08-07, 26 y.o.   MRN: [id number]  Chief Complaint  Patient presents with  . Menstrual Problem    irregular periods/pcos   . Vaginal Discharge    itching & odor     HPI Megan [name] is a 26 y.o. female. Married [place] P2 with PCOS here because she wants a baby. She was seen here in 2017 and had a provera challenge. She has not had a period since 10/18. HPI  No past medical history on file.  No past surgical history on file.  Family History  Problem Relation Age of Onset  . Diabetes Mother   . Hypertension Mother     Social History Social History   Tobacco Use  . Smoking status: Never Smoker  . Smokeless tobacco: Never Used  Substance Use Topics  . Alcohol use: No  . Drug use: No    No Known Allergies  Current Outpatient Medications  Medication Sig Dispense Refill  . ibuprofen (ADVIL,MOTRIN) 200 MG tablet Take 200 mg by mouth every 6 (six) hours as needed.     No current facility-administered medications for this visit.     Review of Systems Review of Systems  She also reports a vaginal itch for 5 months, has not used any meds. Her last pap was 2016 and was normal. Blood pressure 121/88, pulse 80, height 5\' 2"  (1.575 m), weight 159 lb (72.1 kg), last menstrual period 05/19/2017.  Physical Exam Physical Exam Live interpretor present for exam Breathing, conversing, and ambulating normally Abd- benign Normal appearing vaginal discharge Data Reviewed  Diagnosis NEGATIVE FOR INTRAEPITHELIAL LESIONS OR MALIGNANCY.  Assessment    Desire for pregnancy with PCOS- rec weight loss, rec MVI daily, trial of metformin 500 mg BID, increasing gradually to 1000 mg BID  Wet prep sent  Plan    See above       05/21/2017 06/26/2017, 8:53 AM

## 2017-06-27 LAB — CERVICOVAGINAL ANCILLARY ONLY
BACTERIAL VAGINITIS: NEGATIVE
Candida vaginitis: NEGATIVE
TRICH (WINDOWPATH): NEGATIVE

## 2017-08-08 MED FILL — ?METFORMIN HCL 500MG TABLET: 500 | 30 days supply | Qty: 120 | Fill #1

## 2017-08-18 ENCOUNTER — Ambulatory Visit: Payer: Medicaid Other | Attending: Family Medicine

## 2017-09-12 MED FILL — metFORMIN HCL 500 MG TABS: 500 | 30 days supply | Qty: 120 | Fill #2

## 2017-09-19 ENCOUNTER — Other Ambulatory Visit: Payer: Self-pay | Admitting: Family Medicine

## 2017-09-19 ENCOUNTER — Telehealth: Payer: Self-pay | Admitting: Family Medicine

## 2017-09-19 NOTE — Telephone Encounter (Signed)
Interpreter services used [employer]# [phone] called patient and left voicemail for call back. Rheumatology referral denied.

## 2017-09-19 NOTE — Telephone Encounter (Signed)
Interpreter services used [employer] [phone] called emergency contact, not available.

## 2017-12-22 ENCOUNTER — Ambulatory Visit: Payer: Self-pay | Attending: Family Medicine | Admitting: Family Medicine

## 2017-12-22 ENCOUNTER — Encounter: Payer: Self-pay | Admitting: Family Medicine

## 2017-12-22 VITALS — BP 97/64 | HR 78 | Temp 97.8°F | Ht 62.0 in | Wt 166.8 lb

## 2017-12-22 DIAGNOSIS — M545 Low back pain, unspecified: Secondary | ICD-10-CM

## 2017-12-22 DIAGNOSIS — M79632 Pain in left forearm: Secondary | ICD-10-CM | POA: Insufficient documentation

## 2017-12-22 DIAGNOSIS — Z7984 Long term (current) use of oral hypoglycemic drugs: Secondary | ICD-10-CM | POA: Insufficient documentation

## 2017-12-22 DIAGNOSIS — Z791 Long term (current) use of non-steroidal anti-inflammatories (NSAID): Secondary | ICD-10-CM | POA: Insufficient documentation

## 2017-12-22 DIAGNOSIS — E282 Polycystic ovarian syndrome: Secondary | ICD-10-CM | POA: Insufficient documentation

## 2017-12-22 MED ORDER — METHOCARBAMOL 500 MG PO TABS: 500.0000 mg | ORAL_TABLET | Freq: Three times a day (TID) | ORAL | 1 refills | Status: DC | PRN

## 2017-12-22 MED ORDER — METFORMIN HCL 500 MG PO TABS: 1000.0000 mg | ORAL_TABLET | Freq: Two times a day (BID) | ORAL | 3 refills | Status: DC

## 2017-12-22 MED ORDER — NAPROXEN 500 MG PO TABS: 500.0000 mg | ORAL_TABLET | Freq: Two times a day (BID) | ORAL | 3 refills | Status: DC

## 2017-12-22 MED FILL — METHOCARBAMOL 500 MG TABLET: 500 | 30 days supply | Qty: 90 | Fill #0

## 2017-12-22 MED FILL — NAPROXEN 500 MG TABLET: 500 | 30 days supply | Qty: 60 | Fill #0

## 2017-12-22 NOTE — Progress Notes (Signed)
Subjective:  Patient ID: Megan [name], female    DOB: [birth year]  Age: 27 y.o. 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  She complains of 1 month history of low back pain which does not radiate and is worsened with prolonged standing or sitting.  She also has left hand pain which is described as 5/10 and aching.  Previously used naproxen with relief in symptoms.  Denies history of trauma and has no swelling of her hands. She denies numbness in extremities. She was seen by GYN and 06/2017 for PCOS and placed on metformin and she is requesting a refill.    Past Medical History:  Diagnosis Date  . Polycystic ovaries 07/24/2015     No past surgical history on file.  No Known Allergies '  Outpatient Medications Prior to Visit  Medication Sig Dispense Refill  . ibuprofen (ADVIL,MOTRIN) 200 MG tablet Take 200 mg by mouth every 6 (six) hours as needed.    . metFORMIN (GLUCOPHAGE) 500 MG tablet Take 2 tablets by mouth morning and evening. Please start with 1 pill every morning and gradually increase the number to reach the goal dose of 2 tablets in the morning and evening. (Patient not taking: Reported on 12/22/2017) 180 tablet 4   No facility-administered medications prior to visit.     ROS Review of Systems  Constitutional: Negative for activity change, appetite change and fatigue.  HENT: Negative for congestion, sinus pressure and sore throat.   Eyes: Negative for visual disturbance.  Respiratory: Negative for cough, chest tightness, shortness of breath and wheezing.   Cardiovascular: Negative for chest pain and palpitations.  Gastrointestinal: Negative for abdominal distention, abdominal pain and constipation.  Endocrine: Negative for polydipsia.  Genitourinary: Negative for dysuria and frequency.  Musculoskeletal:       See hpi  Skin: Negative for rash.  Neurological:  Negative for tremors, light-headedness and numbness.  Hematological: Does not bruise/bleed easily.  Psychiatric/Behavioral: Negative for agitation and behavioral problems.    Objective:  BP 97/64   Pulse 78   Temp 97.8 F (36.6 C) (Oral)   Ht _0  (1.575 m)   Wt 166 lb 12.8 oz (75.7 kg)   SpO2 98%   BMI 30.51 kg/m   BP/Weight 12/22/2017 06/26/2017 50/27/7412  Systolic BP 97 878 94  Diastolic BP 64 88 62  Wt. (Lbs) 166.8 159 161.2  BMI 30.51 29.08 29.48      Physical Exam  Constitutional: She is oriented to person, place, and time. She appears well-developed and well-nourished.  Cardiovascular: Normal rate, normal heart sounds and intact distal pulses.  No murmur heard. Pulmonary/Chest: Effort normal and breath sounds normal. She has no wheezes. She has no rales. She exhibits no tenderness.  Abdominal: Soft. Bowel sounds are normal. She exhibits no distension and no mass. There is no tenderness.  Musculoskeletal:  Slight tenderness on palpation across lumbar region; negative straight leg raise bilaterally. Tenderness on palpation of flexor aspect of left forearm; no edema noted  Neurological: She is alert and oriented to person, place, and time.     Assessment & Plan:   1. PCOS (polycystic ovarian syndrome) - methocarbamol (ROBAXIN) 500 MG tablet; Take 1 tablet (500 mg total) by mouth every 8 (eight) hours as needed for muscle spasms.  Dispense: 90 tablet; Refill: 1 - metFORMIN (GLUCOPHAGE) 500 MG tablet; Take 2 tablets (1,000 mg  total) by mouth 2 (two) times daily with a meal. Take 2 tablets by mouth morning and evening. Please start with 1 pill every morning and gradually increase the number to reach the goal dose of 2 tablets in the morning and evening.  Dispense: 120 tablet; Refill: 3  2. Acute midline low back pain without sciatica Advised to apply heat - naproxen (NAPROSYN) 500 MG tablet; Take 1 tablet (500 mg total) by mouth 2 (two) times daily with a meal.   Dispense: 60 tablet; Refill: 3  3. Left forearm pain Unknown etiology; will send off cbc - naproxen (NAPROSYN) 500 MG tablet; Take 1 tablet (500 mg total) by mouth 2 (two) times daily with a meal.  Dispense: 60 tablet; Refill: 3 - CBC with Differential/Platelet - CMP14+EGFR   Meds ordered this encounter  Medications  . methocarbamol (ROBAXIN) 500 MG tablet    Sig: Take 1 tablet (500 mg total) by mouth every 8 (eight) hours as needed for muscle spasms.    Dispense:  90 tablet    Refill:  1  . naproxen (NAPROSYN) 500 MG tablet    Sig: Take 1 tablet (500 mg total) by mouth 2 (two) times daily with a meal.    Dispense:  60 tablet    Refill:  3  . metFORMIN (GLUCOPHAGE) 500 MG tablet    Sig: Take 2 tablets (1,000 mg total) by mouth 2 (two) times daily with a meal. Take 2 tablets by mouth morning and evening. Please start with 1 pill every morning and gradually increase the number to reach the goal dose of 2 tablets in the morning and evening.    Dispense:  120 tablet    Refill:  3    Follow-up: Return in about 3 months (around 03/24/2018) for follow up of back pain.   Charlott Rakes MD

## 2017-12-23 LAB — CBC WITH DIFFERENTIAL/PLATELET
BASOS ABS: 0.1 10*3/uL (ref 0.0–0.2)
Basos: 1 %
EOS (ABSOLUTE): 0.2 10*3/uL (ref 0.0–0.4)
EOS: 2 %
HEMATOCRIT: 40.7 % (ref 34.0–46.6)
HEMOGLOBIN: 13.7 g/dL (ref 11.1–15.9)
IMMATURE GRANULOCYTES: 0 %
Immature Grans (Abs): 0 10*3/uL (ref 0.0–0.1)
Lymphocytes Absolute: 3.1 10*3/uL (ref 0.7–3.1)
Lymphs: 33 %
MCH: 28.2 pg (ref 26.6–33.0)
MCHC: 33.7 g/dL (ref 31.5–35.7)
MCV: 84 fL (ref 79–97)
MONOCYTES: 9 %
MONOS ABS: 0.9 10*3/uL (ref 0.1–0.9)
Neutrophils Absolute: 5.2 10*3/uL (ref 1.4–7.0)
Neutrophils: 55 %
Platelets: 341 10*3/uL (ref 150–450)
RBC: 4.85 x10E6/uL (ref 3.77–5.28)
RDW: 13.5 % (ref 12.3–15.4)
WBC: 9.4 10*3/uL (ref 3.4–10.8)

## 2017-12-23 LAB — CMP14+EGFR
ALT: 27 IU/L (ref 0–32)
AST: 27 IU/L (ref 0–40)
Albumin/Globulin Ratio: 1.5 (ref 1.2–2.2)
Albumin: 4.6 g/dL (ref 3.5–5.5)
Alkaline Phosphatase: 44 IU/L (ref 39–117)
BUN/Creatinine Ratio: 13 (ref 9–23)
BUN: 7 mg/dL (ref 6–20)
Bilirubin Total: 0.4 mg/dL (ref 0.0–1.2)
CALCIUM: 9.4 mg/dL (ref 8.7–10.2)
CO2: 20 mmol/L (ref 20–29)
CREATININE: 0.54 mg/dL — AB (ref 0.57–1.00)
Chloride: 104 mmol/L (ref 96–106)
GFR calc Af Amer: 152 mL/min/{1.73_m2} (ref >59)
GFR, EST NON AFRICAN AMERICAN: 132 mL/min/{1.73_m2} (ref >59)
GLOBULIN, TOTAL: 3 g/dL (ref 1.5–4.5)
GLUCOSE: 76 mg/dL (ref 65–99)
Potassium: 4.2 mmol/L (ref 3.5–5.2)
SODIUM: 139 mmol/L (ref 134–144)
Total Protein: 7.6 g/dL (ref 6.0–8.5)

## 2017-12-25 ENCOUNTER — Ambulatory Visit: Payer: Self-pay | Attending: Family Medicine

## 2017-12-27 ENCOUNTER — Telehealth: Payer: Self-pay

## 2017-12-27 NOTE — Telephone Encounter (Signed)
Patient was called and informed of lab results via interpreter([id number]).

## 2018-01-05 MED FILL — ?METFORMIN HCL 500MG TABLET: 500 | 30 days supply | Qty: 120 | Fill #3

## 2018-03-01 ENCOUNTER — Encounter: Payer: Self-pay | Admitting: Family Medicine

## 2018-03-01 ENCOUNTER — Ambulatory Visit: Payer: Self-pay | Attending: Family Medicine | Admitting: Family Medicine

## 2018-03-01 VITALS — BP 119/72 | HR 83 | Temp 97.9°F | Ht 62.0 in | Wt 167.6 lb

## 2018-03-01 DIAGNOSIS — M545 Low back pain, unspecified: Secondary | ICD-10-CM

## 2018-03-01 DIAGNOSIS — M25541 Pain in joints of right hand: Secondary | ICD-10-CM | POA: Insufficient documentation

## 2018-03-01 DIAGNOSIS — Z7984 Long term (current) use of oral hypoglycemic drugs: Secondary | ICD-10-CM | POA: Insufficient documentation

## 2018-03-01 DIAGNOSIS — J0120 Acute ethmoidal sinusitis, unspecified: Secondary | ICD-10-CM | POA: Insufficient documentation

## 2018-03-01 DIAGNOSIS — E282 Polycystic ovarian syndrome: Secondary | ICD-10-CM | POA: Insufficient documentation

## 2018-03-01 DIAGNOSIS — G8929 Other chronic pain: Secondary | ICD-10-CM | POA: Insufficient documentation

## 2018-03-01 DIAGNOSIS — G4709 Other insomnia: Secondary | ICD-10-CM | POA: Insufficient documentation

## 2018-03-01 DIAGNOSIS — M25542 Pain in joints of left hand: Secondary | ICD-10-CM | POA: Insufficient documentation

## 2018-03-01 MED ORDER — CETIRIZINE HCL 10 MG PO TABS: 10.0000 mg | ORAL_TABLET | Freq: Every day | ORAL | 1 refills | Status: DC

## 2018-03-01 MED ORDER — TRAZODONE HCL 50 MG PO TABS
50.0000 mg | ORAL_TABLET | Freq: Every evening | ORAL | 1 refills | Status: DC | PRN
Start: 2018-03-01 — End: 2019-01-16

## 2018-03-01 MED ORDER — METHOCARBAMOL 500 MG PO TABS: 500.0000 mg | ORAL_TABLET | Freq: Three times a day (TID) | ORAL | 1 refills | Status: DC | PRN

## 2018-03-01 MED ORDER — METFORMIN HCL 500 MG PO TABS: 1000.0000 mg | ORAL_TABLET | Freq: Two times a day (BID) | ORAL | 3 refills | Status: DC

## 2018-03-01 MED ORDER — PREDNISONE 20 MG PO TABS: 20.0000 mg | ORAL_TABLET | Freq: Every day | ORAL | 0 refills | Status: DC

## 2018-03-01 MED ORDER — ALBUTEROL SULFATE HFA 108 (90 BASE) MCG/ACT IN AERS: 2.0000 | INHALATION_SPRAY | Freq: Four times a day (QID) | RESPIRATORY_TRACT | 2 refills | Status: DC | PRN

## 2018-03-01 MED FILL — metFORMIN HCL 500 MG TABS: 500 | 30 days supply | Qty: 120 | Fill #0

## 2018-03-01 MED FILL — METHOCARBAMOL 500 MG TABS: 500 | 30 days supply | Qty: 90 | Fill #0

## 2018-03-01 MED FILL — ?CETIRIZINE HCL 10 MG TABLE: 10 | 30 days supply | Qty: 30 | Fill #0

## 2018-03-01 MED FILL — !VENTOLIN HFA INHALER: 108 (90 BAS | 25 days supply | Qty: 18 | Fill #0

## 2018-03-01 MED FILL — traZODone HCL 50 MG TABS: 50 | 30 days supply | Qty: 30 | Fill #0

## 2018-03-01 MED FILL — predniSONE 20 MG TABS: 20 | 5 days supply | Qty: 5 | Fill #0

## 2018-03-01 NOTE — Progress Notes (Signed)
Subjective:  Patient ID: Megan [name], female    DOB: 1991-03-16  Age: 27 y.o. MRN: [id number]  CC: Back Pain; Hand Pain; and Cough   HPI Season Kitchin is a 27 year old female with a history of polycystic ovarian syndrome who presents today for follow-up visit.  She has had arthralgias of her hands and low back pain which is chronic and complains of worsening with hand pains rated at 8/10 and she also has difficulty bending her fingers and using her hands.  Her knees and ankles have also been painful with cold weather and with the rain.  She is unsure if she has swelling of her joints. She uses the right hand thumb spica splint which relieves her symptoms as well as the use of her NSAID and muscle relaxant.  View of her labs indicate an elevated rheumatoid factor but negative CCP, negative ANA and SSA ab from 01/2017. She has low back pain with prolonged sitting or standing but states this is absent at this time.  Pain does not radiate down her lower extremities. For the last 3 days she has had a cough, sore throat, rhinorrhea with ethmoidal sinus pain but no fever or myalgias. She also complains of insomnia due to her pain and also due to the fact that she feels someone around her and coming towards her while she is asleep but denies this when awake and denies auditory hallucinations.  Past Medical History:  Diagnosis Date  . Polycystic ovaries 07/24/2015    History reviewed. No pertinent surgical history.  No Known Allergies   Outpatient Medications Prior to Visit  Medication Sig Dispense Refill  . naproxen (NAPROSYN) 500 MG tablet Take 1 tablet (500 mg total) by mouth 2 (two) times daily with a meal. 60 tablet 3  . metFORMIN (GLUCOPHAGE) 500 MG tablet Take 2 tablets (1,000 mg total) by mouth 2 (two) times daily with a meal. Take 2 tablets by mouth morning and evening. Please start with 1 pill every morning and gradually increase the number to reach the goal dose of 2 tablets in the  morning and evening. 120 tablet 3  . methocarbamol (ROBAXIN) 500 MG tablet Take 1 tablet (500 mg total) by mouth every 8 (eight) hours as needed for muscle spasms. 90 tablet 1   No facility-administered medications prior to visit.     ROS Review of Systems  Constitutional: Negative for activity change, appetite change and fatigue.  HENT: Positive for rhinorrhea. Negative for congestion, sinus pressure and sore throat.   Eyes: Negative for visual disturbance.  Respiratory: Positive for cough. Negative for chest tightness, shortness of breath and wheezing.   Cardiovascular: Negative for chest pain and palpitations.  Gastrointestinal: Negative for abdominal distention, abdominal pain and constipation.  Endocrine: Negative for polydipsia.  Genitourinary: Negative for dysuria and frequency.  Musculoskeletal:       See hpi  Skin: Negative for rash.  Neurological: Negative for tremors, light-headedness and numbness.  Hematological: Does not bruise/bleed easily.  Psychiatric/Behavioral: Negative for agitation and behavioral problems.    Objective:  BP 119/72   Pulse 83   Temp 97.9 F (36.6 C) (Oral)   Ht 5\' 2"  (1.575 m)   Wt 167 lb 9.6 oz (76 kg)   SpO2 99%   BMI 30.65 kg/m   BP/Weight 03/01/2018 12/22/2017 06/26/2017  Systolic BP 119 97 121  Diastolic BP 72 64 88  Wt. (Lbs) 167.6 166.8 159  BMI 30.65 30.51 29.08      Physical Exam  Constitutional: She is oriented to person, place, and time. She appears well-developed and well-nourished.  Cardiovascular: Normal rate, normal heart sounds and intact distal pulses.  No murmur heard. Pulmonary/Chest: Effort normal and breath sounds normal. She has no wheezes. She has no rales. She exhibits no tenderness.  Abdominal: Soft. Bowel sounds are normal. She exhibits no distension and no mass. There is no tenderness.  Musculoskeletal:  Positive Finkelstein test .Tenderness on palpation of base of thumb bilaterally and MCP and PIP joints,  flexor aspect of bilateral forearm with inability to make a complete fist in both hands Tenderness on flexion of both knees Slight tenderness on palpation of lumbar spine; negative straight leg raise bilateral  Neurological: She is alert and oriented to person, place, and time.  Skin: Skin is warm and dry.  Psychiatric: She has a normal mood and affect.     Assessment & Plan:   1. PCOS (polycystic ovarian syndrome) - metFORMIN (GLUCOPHAGE) 500 MG tablet; Take 2 tablets (1,000 mg total) by mouth 2 (two) times daily with a meal.  Dispense: 120 tablet; Refill: 3  2. Arthralgia of both hands Previous history of elevated rheumatoid factor We will need to evaluate for underlying autoimmune condition High suspicion for reference tendinopathy Continue with thumb spica splint; short course of prednisone added Consider referral for cortisone injection if persisting - DG Hand Complete Left; Future - DG Hand Complete Right; Future - Sedimentation Rate - Rheumatoid factor - CYCLIC CITRUL PEPTIDE ANTIBODY, IGG/IGA - ANA,IFA RA Diag Pnl w/rflx Tit/Patn - Anti-Smith antibody - Anti-DNA antibody, double-stranded - predniSONE (DELTASONE) 20 MG tablet; Take 1 tablet (20 mg total) by mouth daily with breakfast.  Dispense: 5 tablet; Refill: 0  3. Chronic bilateral low back pain without sciatica - methocarbamol (ROBAXIN) 500 MG tablet; Take 1 tablet (500 mg total) by mouth every 8 (eight) hours as needed for muscle spasms.  Dispense: 90 tablet; Refill: 1 - DG Lumbar Spine Complete; Future  4. Other insomnia Uncontrolled probably due to pain Her history is suspicious for visual hallucinations but this is not entirely clear We will investigate this further at her next visit, meanwhile commence trazodone - traZODone (DESYREL) 50 MG tablet; Take 1 tablet (50 mg total) by mouth at bedtime as needed for sleep.  Dispense: 30 tablet; Refill: 1  5. Acute non-recurrent ethmoidal sinusitis - cetirizine  (ZYRTEC) 10 MG tablet; Take 1 tablet (10 mg total) by mouth daily.  Dispense: 30 tablet; Refill: 1    Meds ordered this encounter  Medications  . methocarbamol (ROBAXIN) 500 MG tablet    Sig: Take 1 tablet (500 mg total) by mouth every 8 (eight) hours as needed for muscle spasms.    Dispense:  90 tablet    Refill:  1  . cetirizine (ZYRTEC) 10 MG tablet    Sig: Take 1 tablet (10 mg total) by mouth daily.    Dispense:  30 tablet    Refill:  1  . albuterol (PROVENTIL HFA;VENTOLIN HFA) 108 (90 Base) MCG/ACT inhaler    Sig: Inhale 2 puffs into the lungs every 6 (six) hours as needed for wheezing or shortness of breath.    Dispense:  1 Inhaler    Refill:  2  . traZODone (DESYREL) 50 MG tablet    Sig: Take 1 tablet (50 mg total) by mouth at bedtime as needed for sleep.    Dispense:  30 tablet    Refill:  1  . predniSONE (DELTASONE) 20 MG tablet  Sig: Take 1 tablet (20 mg total) by mouth daily with breakfast.    Dispense:  5 tablet    Refill:  0  . metFORMIN (GLUCOPHAGE) 500 MG tablet    Sig: Take 2 tablets (1,000 mg total) by mouth 2 (two) times daily with a meal.    Dispense:  120 tablet    Refill:  3    Follow-up: Return in about 6 weeks (around 04/12/2018) for follow up of arthralgia and insomnia.   Hoy Register MD

## 2018-03-01 NOTE — Patient Instructions (Signed)

## 2018-03-03 LAB — ANA,IFA RA DIAG PNL W/RFLX TIT/PATN
ANA TITER 1: NEGATIVE
Cyclic Citrullin Peptide Ab: 7 units (ref 0–19)
RHEUMATOID FACTOR: 23.8 [IU]/mL — AB (ref 0.0–13.9)

## 2018-03-03 LAB — ANTI-DNA ANTIBODY, DOUBLE-STRANDED: DSDNA AB: 1 [IU]/mL (ref 0–9)

## 2018-03-03 LAB — SEDIMENTATION RATE: SED RATE: 31 mm/h (ref 0–32)

## 2018-03-03 LAB — ANTI-SMITH ANTIBODY: ENA SM Ab Ser-aCnc: <0.2 AI (ref 0.0–0.9)

## 2018-03-05 ENCOUNTER — Other Ambulatory Visit: Payer: Self-pay | Admitting: Family Medicine

## 2018-03-05 DIAGNOSIS — R768 Other specified abnormal immunological findings in serum: Secondary | ICD-10-CM

## 2018-03-19 ENCOUNTER — Ambulatory Visit (HOSPITAL_COMMUNITY)
Admission: RE | Admit: 2018-03-19 | Discharge: 2018-03-19 | Disposition: A | Discharge status: Home / Self Care | Payer: Medicaid Other | Source: Ambulatory Visit | Attending: Family Medicine | Admitting: Family Medicine

## 2018-03-19 DIAGNOSIS — M545 Low back pain: Secondary | ICD-10-CM | POA: Insufficient documentation

## 2018-03-19 DIAGNOSIS — G8929 Other chronic pain: Secondary | ICD-10-CM

## 2018-03-19 DIAGNOSIS — M25542 Pain in joints of left hand: Secondary | ICD-10-CM | POA: Insufficient documentation

## 2018-03-19 DIAGNOSIS — M25541 Pain in joints of right hand: Secondary | ICD-10-CM

## 2018-03-23 ENCOUNTER — Telehealth: Payer: Self-pay

## 2018-03-23 NOTE — Telephone Encounter (Signed)
-----   Message from Hoy Register, MD sent at 03/20/2018  5:01 PM EDT ----- X-ray of the right hand reveals no abnormalities

## 2018-03-23 NOTE — Telephone Encounter (Signed)
Patient was called and informed of all x-ray results, and lab results.

## 2018-04-03 ENCOUNTER — Telehealth: Payer: Self-pay | Admitting: *Deleted

## 2018-04-03 NOTE — Telephone Encounter (Signed)
-----   Message from Hoy Register, MD sent at 03/23/2018 11:51 AM EDT ----- X-ray of the spine reveal slight bony defect that could explain her back pain.  For now we will try conservative therapies with pain medications and if symptoms do not improve we will proceed with MRI.

## 2018-04-03 NOTE — Telephone Encounter (Signed)
Medical Assistant used [employer] to contact patient.  Interpreter Name: Kyra Searles #: [id number] Patient verified DOB Patient is aware of xray showing a bony defect in spine that could be causing the back pain. Patient advised to use pain medication and therapy to aid in symptoms. Patient will follow up 04/17/18 with PCP.

## 2018-04-19 ENCOUNTER — Ambulatory Visit: Payer: Self-pay | Attending: Family Medicine | Admitting: Family Medicine

## 2018-04-19 ENCOUNTER — Encounter: Payer: Self-pay | Admitting: Family Medicine

## 2018-04-19 VITALS — BP 109/70 | HR 68 | Temp 98.0°F | Ht 62.0 in | Wt 169.2 lb

## 2018-04-19 DIAGNOSIS — E282 Polycystic ovarian syndrome: Secondary | ICD-10-CM | POA: Insufficient documentation

## 2018-04-19 DIAGNOSIS — Z79899 Other long term (current) drug therapy: Secondary | ICD-10-CM | POA: Insufficient documentation

## 2018-04-19 DIAGNOSIS — R768 Other specified abnormal immunological findings in serum: Secondary | ICD-10-CM

## 2018-04-19 DIAGNOSIS — I10 Essential (primary) hypertension: Secondary | ICD-10-CM | POA: Insufficient documentation

## 2018-04-19 DIAGNOSIS — R760 Raised antibody titer: Secondary | ICD-10-CM | POA: Insufficient documentation

## 2018-04-19 DIAGNOSIS — Z7984 Long term (current) use of oral hypoglycemic drugs: Secondary | ICD-10-CM | POA: Insufficient documentation

## 2018-04-19 DIAGNOSIS — N979 Female infertility, unspecified: Secondary | ICD-10-CM

## 2018-04-19 DIAGNOSIS — Z8261 Family history of arthritis: Secondary | ICD-10-CM | POA: Insufficient documentation

## 2018-04-19 DIAGNOSIS — M2550 Pain in unspecified joint: Secondary | ICD-10-CM | POA: Insufficient documentation

## 2018-04-19 MED ORDER — TRAMADOL HCL 50 MG PO TABS: 50.0000 mg | ORAL_TABLET | Freq: Two times a day (BID) | ORAL | 1 refills | Status: DC | PRN

## 2018-04-19 MED FILL — traMADol HCL 50 MG TABS: 50 | 15 days supply | Qty: 30 | Fill #0

## 2018-04-19 NOTE — Progress Notes (Signed)
Subjective:  Patient ID: Megan [name], female    DOB: 1990/11/13  Age: 27 y.o. MRN: [id number]  CC: Hand Pain   HPI Megan [name]  is a 27 year old female with a history of polycystic ovarian syndrome who presents today for follow-up visit. She had complained of arthralgias at her last visit and has been on Naproxen, Prednisone and Robaxin with no relief in symptoms. Blood work ordered including anti double stranded DNA, ANA, anti smith ab, CCP, sed rate were negative but RF was elevated at 23.8 (N0-13.9). She  Informs me of a history of Rheumatoid arthritis in her mother. Pain occurs in her shoulders, elbows, knees, ankles and is rated as 5/10 but she denies the presence of swelling.  She is also concerned about her inability to conceive again. She has two children the last of whom is 7.She has been compliant with Metformin.  Past Medical History:  Diagnosis Date  . Polycystic ovaries 07/24/2015    No past surgical history on file.  No Known Allergies   Outpatient Medications Prior to Visit  Medication Sig Dispense Refill  . metFORMIN (GLUCOPHAGE) 500 MG tablet Take 2 tablets (1,000 mg total) by mouth 2 (two) times daily with a meal. 120 tablet 3  . methocarbamol (ROBAXIN) 500 MG tablet Take 1 tablet (500 mg total) by mouth every 8 (eight) hours as needed for muscle spasms. 90 tablet 1  . naproxen (NAPROSYN) 500 MG tablet Take 1 tablet (500 mg total) by mouth 2 (two) times daily with a meal. 60 tablet 3  . traZODone (DESYREL) 50 MG tablet Take 1 tablet (50 mg total) by mouth at bedtime as needed for sleep. 30 tablet 1  . albuterol (PROVENTIL HFA;VENTOLIN HFA) 108 (90 Base) MCG/ACT inhaler Inhale 2 puffs into the lungs every 6 (six) hours as needed for wheezing or shortness of breath. (Patient not taking: Reported on 04/19/2018) 1 Inhaler 2  . cetirizine (ZYRTEC) 10 MG tablet Take 1 tablet (10 mg total) by mouth daily. (Patient not taking: Reported on 04/19/2018) 30 tablet 1  .  predniSONE (DELTASONE) 20 MG tablet Take 1 tablet (20 mg total) by mouth daily with breakfast. (Patient not taking: Reported on 04/19/2018) 5 tablet 0   No facility-administered medications prior to visit.     ROS Review of Systems  Constitutional: Negative for activity change, appetite change and fatigue.  HENT: Negative for congestion, sinus pressure and sore throat.   Eyes: Negative for visual disturbance.  Respiratory: Negative for cough, chest tightness, shortness of breath and wheezing.   Cardiovascular: Negative for chest pain and palpitations.  Gastrointestinal: Negative for abdominal distention, abdominal pain and constipation.  Endocrine: Negative for polydipsia.  Genitourinary: Negative for dysuria and frequency.  Musculoskeletal:       See hpi  Skin: Negative for rash.  Neurological: Negative for tremors, light-headedness and numbness.  Hematological: Does not bruise/bleed easily.  Psychiatric/Behavioral: Negative for agitation and behavioral problems.    Objective:  BP 109/70   Pulse 68   Temp 98 F (36.7 C) (Oral)   Ht 5\' 2"  (1.575 m)   Wt 169 lb 3.2 oz (76.7 kg)   SpO2 99%   BMI 30.95 kg/m   BP/Weight 04/19/2018 03/01/2018 12/22/2017  Systolic BP 109 119 97  Diastolic BP 70 72 64  Wt. (Lbs) 169.2 167.6 166.8  BMI 30.95 30.65 30.51      Physical Exam  Constitutional: She is oriented to person, place, and time. She appears well-developed and well-nourished.  Cardiovascular:  Normal rate, normal heart sounds and intact distal pulses.  No murmur heard. Pulmonary/Chest: Effort normal and breath sounds normal. She has no wheezes. She has no rales. She exhibits no tenderness.  Abdominal: Soft. Bowel sounds are normal. She exhibits no distension and no mass. There is no tenderness.  Musculoskeletal: Normal range of motion.  Neurological: She is alert and oriented to person, place, and time.  Skin: Skin is warm and dry.  Psychiatric: She has a normal mood and  affect.     Assessment & Plan:   1. Elevated rheumatoid factor She does have a family history of Rheumatoid Arthritis Referred to Rheumatology given ongoing arthralgia - Ambulatory referral to Rheumatology  2. Arthralgia, unspecified joint Uncontrolled Tramadol added to regimen - traMADol (ULTRAM) 50 MG tablet; Take 1 tablet (50 mg total) by mouth every 12 (twelve) hours as needed.  Dispense: 30 tablet; Refill: 1 - Ambulatory referral to Rheumatology  3. 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 traMADol (ULTRAM) 50 MG tablet    Sig: Take 1 tablet (50 mg total) by mouth every 12 (twelve) hours as needed.    Dispense:  30 tablet    Refill:  1    Dx: arthralgias    Follow-up: Return in about 6 months (around 10/18/2018) for follow up of chronic medical conditions.   Hoy Register MD

## 2018-05-16 ENCOUNTER — Encounter: Payer: Self-pay | Admitting: Obstetrics and Gynecology

## 2018-05-16 ENCOUNTER — Ambulatory Visit (INDEPENDENT_AMBULATORY_CARE_PROVIDER_SITE_OTHER): Payer: Self-pay | Admitting: Obstetrics and Gynecology

## 2018-05-16 VITALS — BP 138/71 | HR 75 | Ht 63.0 in | Wt 170.5 lb

## 2018-05-16 DIAGNOSIS — Z789 Other specified health status: Secondary | ICD-10-CM

## 2018-05-16 DIAGNOSIS — Z3169 Encounter for other general counseling and advice on procreation: Secondary | ICD-10-CM

## 2018-05-16 DIAGNOSIS — Z124 Encounter for screening for malignant neoplasm of cervix: Secondary | ICD-10-CM

## 2018-05-16 NOTE — Progress Notes (Signed)
   GYNECOLOGY OFFICE NOTE  History:  27 y.o. G2P2 here today for follow up for fertility. She is desiring pregnancy. Has been seen in this office before for same and had provera challenge, as well as started on metformin 500 mg BID.  Has been trying to get pregnant for about a year and a half, not using any form of contraception. Having periods 1-2 days per month, every month.  Past Medical History:  Diagnosis Date  . Polycystic ovaries 07/24/2015   History reviewed. No pertinent surgical history.   Current Outpatient Medications:  .  metFORMIN (GLUCOPHAGE) 500 MG tablet, Take 2 tablets (1,000 mg total) by mouth 2 (two) times daily with a meal., Disp: 120 tablet, Rfl: 3 .  predniSONE (DELTASONE) 20 MG tablet, Take 1 tablet (20 mg total) by mouth daily with breakfast., Disp: 5 tablet, Rfl: 0 .  traMADol (ULTRAM) 50 MG tablet, Take 1 tablet (50 mg total) by mouth every 12 (twelve) hours as needed., Disp: 30 tablet, Rfl: 1 .  traZODone (DESYREL) 50 MG tablet, Take 1 tablet (50 mg total) by mouth at bedtime as needed for sleep., Disp: 30 tablet, Rfl: 1  The following portions of the patient's history were reviewed and updated as appropriate: allergies, current medications, past family history, past medical history, past social history, past surgical history and problem list.   Review of Systems:  Pertinent items noted in HPI and remainder of comprehensive ROS otherwise negative.   Objective:  Physical Exam BP 138/71   Pulse 75   Ht 5\' 3"  (1.6 m)   Wt 170 lb 8 oz (77.3 kg)   LMP 05/13/2018 (Exact Date)   BMI 30.20 kg/m  CONSTITUTIONAL: Well-developed, well-nourished female in no acute distress.  HENT:  Normocephalic, atraumatic. External right and left ear normal. Oropharynx is clear and moist EYES: Conjunctivae and EOM are normal. Pupils are equal, round, and reactive to light. No scleral icterus.  NECK: Normal range of motion, supple, no masses SKIN: Skin is warm and dry. No rash  noted. Not diaphoretic. No erythema. No pallor. NEUROLOGIC: Alert and oriented to person, place, and time. Normal reflexes, muscle tone coordination. No cranial nerve deficit noted. PSYCHIATRIC: Normal mood and affect. Normal behavior. Normal judgment and thought content. CARDIOVASCULAR: Normal heart rate noted RESPIRATORY: Effort normal, no problems with respiration noted ABDOMEN: Soft, no distention noted.   PELVIC: deferred per patient MUSCULOSKELETAL: Normal range of motion. No edema noted.  Labs and Imaging No results found.  Assessment & Plan:   1. Infertility counseling Cont metformin Reviewed with patient that we do not manage infertility at this office, referral made to Dr. 05/15/2018 of [facility]. She verbalizes understanding that their office will call her to schedule appointment  2. Language barrier April Manson used  3. Pap smear for cervical cancer screening Due for pap, declines today due to cost Gave info for free pap clinic   Routine preventative health maintenance measures emphasized. Please refer to After Visit Summary for other counseling recommendations.   Return in about 1 year (around 05/17/2019), or if symptoms worsen or fail to improve.   05/19/2019, M.D. [facility]

## 2018-05-16 NOTE — Patient Instructions (Addendum)
Call Dr. Lyndal Rainbow office to make appointment for fertility testing/counseling/treatment: [phone].  Call [phone] to schedule your pap.

## 2018-07-09 ENCOUNTER — Ambulatory Visit: Payer: Self-pay | Attending: Family Medicine

## 2018-07-16 ENCOUNTER — Ambulatory Visit: Payer: Medicaid Other | Admitting: Family Medicine

## 2018-12-18 ENCOUNTER — Encounter (HOSPITAL_COMMUNITY): Payer: Self-pay

## 2018-12-18 ENCOUNTER — Emergency Department (HOSPITAL_COMMUNITY)
Admission: EM | Admit: 2018-12-18 | Discharge: 2018-12-19 | Disposition: A | Discharge status: Home / Self Care | Payer: Self-pay | Attending: Emergency Medicine | Admitting: Emergency Medicine

## 2018-12-18 ENCOUNTER — Emergency Department (HOSPITAL_COMMUNITY): Payer: Self-pay

## 2018-12-18 DIAGNOSIS — Y999 Unspecified external cause status: Secondary | ICD-10-CM | POA: Insufficient documentation

## 2018-12-18 DIAGNOSIS — Y929 Unspecified place or not applicable: Secondary | ICD-10-CM | POA: Insufficient documentation

## 2018-12-18 DIAGNOSIS — R51 Headache: Secondary | ICD-10-CM | POA: Insufficient documentation

## 2018-12-18 DIAGNOSIS — Z79899 Other long term (current) drug therapy: Secondary | ICD-10-CM | POA: Insufficient documentation

## 2018-12-18 DIAGNOSIS — S0083XA Contusion of other part of head, initial encounter: Secondary | ICD-10-CM | POA: Insufficient documentation

## 2018-12-18 DIAGNOSIS — Y939 Activity, unspecified: Secondary | ICD-10-CM | POA: Insufficient documentation

## 2018-12-18 DIAGNOSIS — Z7984 Long term (current) use of oral hypoglycemic drugs: Secondary | ICD-10-CM | POA: Insufficient documentation

## 2018-12-18 NOTE — ED Notes (Signed)
Bed: WTR5 Expected date:  Expected time:  Means of arrival:  Comments: 
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
## 2018-12-19 MED ORDER — ACETAMINOPHEN 500 MG PO TABS
1000.0000 mg | ORAL_TABLET | Freq: Once | ORAL | Status: AC
  Administered 2018-12-19: 1000 mg via ORAL
  Filled 2018-12-19: qty 2

## 2018-12-19 MED ORDER — IBUPROFEN 800 MG PO TABS
800.0000 mg | ORAL_TABLET | Freq: Once | ORAL | Status: AC
  Administered 2018-12-19: 800 mg via ORAL
  Filled 2018-12-19: qty 1

## 2018-12-19 MED ORDER — IBUPROFEN 800 MG PO TABS: 800.0000 mg | ORAL_TABLET | Freq: Four times a day (QID) | ORAL | 0 refills | Status: DC | PRN
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
## 2019-01-16 ENCOUNTER — Other Ambulatory Visit: Payer: Self-pay

## 2019-01-16 ENCOUNTER — Ambulatory Visit: Payer: Self-pay | Attending: Family Medicine | Admitting: Physician Assistant

## 2019-01-16 VITALS — Wt 163.0 lb

## 2019-01-16 DIAGNOSIS — Z789 Other specified health status: Secondary | ICD-10-CM

## 2019-01-16 DIAGNOSIS — N390 Urinary tract infection, site not specified: Secondary | ICD-10-CM

## 2019-01-16 LAB — POCT URINALYSIS DIP (CLINITEK)
Bilirubin, UA: NEGATIVE
Blood, UA: NEGATIVE
Glucose, UA: NEGATIVE mg/dL
Ketones, POC UA: NEGATIVE mg/dL
Nitrite, UA: NEGATIVE
POC PROTEIN,UA: NEGATIVE
Spec Grav, UA: 1.025 (ref 1.010–1.025)
Urobilinogen, UA: 0.2 E.U./dL
pH, UA: 5.5 (ref 5.0–8.0)

## 2019-01-16 MED ORDER — SULFAMETHOXAZOLE-TRIMETHOPRIM 800-160 MG PO TABS: 1.0000 | ORAL_TABLET | Freq: Two times a day (BID) | ORAL | 0 refills | Status: DC

## 2019-01-16 MED ORDER — FLUCONAZOLE 150 MG PO TABS: 150.0000 mg | ORAL_TABLET | Freq: Once | ORAL | 0 refills | Status: AC

## 2019-01-16 MED FILL — FLUCONAZOLE 150 MG TABS: 150 | 1 days supply | Qty: 1 | Fill #0

## 2019-01-16 MED FILL — SULFAMETHOXAZOLE-TMP DS TAB: 800-160 | 5 days supply | Qty: 10 | Fill #0

## 2019-01-16 NOTE — Progress Notes (Signed)
Tea [id number]  Vaginal discharge for 2 month and irritation

## 2019-01-16 NOTE — Patient Instructions (Signed)
Increase water intake

## 2019-01-16 NOTE — Progress Notes (Signed)
   Megan [name], is a 28 y.o. female  ZYS:[id number]  XNA:[id number]  DOB - 12/07/1990  Subjective:  Chief Complaint and HPI: Megan [name] is a 28 y.o. female here today for 2 month h/o vaginal itching and at times whitish discharge.  No pelvic pain.  Monogamous with husband.  No fever.  No N/V/D.  This week, she also started having some burning with urination.    ROS:   Constitutional:  No f/c, No night sweats, No unexplained weight loss. EENT:  No vision changes, No blurry vision, No hearing changes. No mouth, throat, or ear problems.  Respiratory: No cough, No SOB Cardiac: No CP, no palpitations GI:  No abd pain, No N/V/D. GU: see above Musculoskeletal: No joint pain Neuro: No headache, no dizziness, no motor weakness.  Skin: No rash Endocrine:  No polydipsia. No polyuria.  Psych: Denies SI/HI  No problems updated.  ALLERGIES: No Known Allergies  PAST MEDICAL HISTORY: Past Medical History:  Diagnosis Date  . Polycystic ovaries 07/24/2015    MEDICATIONS AT HOME: Prior to Admission medications   Medication Sig Start Date End Date Taking? Authorizing Provider  fluconazole (DIFLUCAN) 150 MG tablet Take 1 tablet (150 mg total) by mouth once for 1 dose. If you develop a yeast infection after your antibiotic. 01/16/19 01/16/19  Argentina Donovan, PA-C     Objective:  EXAM:   Vitals:   01/16/19 1008  Weight: 163 lb (73.9 kg)    General appearance : A&OX3. NAD. Non-toxic-appearing HEENT: Atraumatic and Normocephalic.  PERRLA. EOM intact.   Chest/Lungs:  Breathing-non-labored, Good air entry bilaterally, breath sounds normal without rales, rhonchi, or wheezing  CVS: S1 S2 regular, no murmurs, gallops, rubs  Neurology:  CN II-XII grossly intact, Non focal.   Psych:  TP linear. J/I WNL. Normal speech. Appropriate eye contact and affect.  Skin:  No Rash  Data Review Lab Results  Component Value Date   HGBA1C 5.6 08/23/2016   HGBA1C 5.50 08/17/2015      Assessment & Plan   1. Urinary tract infection without hematuria, site unspecified Will cover due to leukocytes.  Increase water intake - POCT URINALYSIS DIP (CLINITEK) - Urine cytology ancillary only - Urine Culture - fluconazole (DIFLUCAN) 150 MG tablet; Take 1 tablet (150 mg total) by mouth once for 1 dose. If you develop a yeast infection after your antibiotic.  Dispense: 1 tablet; Refill: 0  2. Language barrier stratus interpreters; Burmese interpreter used and additional time performing visit was required.    Patient have been counseled extensively about nutrition and exercise  F/up prn  The patient was given clear instructions to go to ER or return to medical center if symptoms don't improve, worsen or new problems develop. The patient verbalized understanding. The patient was told to call to get lab results if they haven't heard anything in the next week.     Freeman Caldron, PA-C [facility] and [employer], [place]   01/16/2019, 10:21 AMPatient ID: Megan [name], female   DOB: [birth year], 28 y.o.   MRN: [id number]

## 2019-01-17 LAB — URINE CYTOLOGY ANCILLARY ONLY
Bacterial vaginitis: NEGATIVE
Candida vaginitis: NEGATIVE
Chlamydia: NEGATIVE
Neisseria Gonorrhea: NEGATIVE
Trichomonas: NEGATIVE

## 2019-01-18 LAB — URINE CULTURE

## 2019-01-22 ENCOUNTER — Telehealth: Payer: Self-pay | Admitting: Emergency Medicine

## 2019-01-22 NOTE — Telephone Encounter (Signed)
Called patient and patients husband through interpreter [phone] and received no answer.  Left message to return call.  Left return call number.

## 2019-01-29 ENCOUNTER — Other Ambulatory Visit: Payer: Self-pay

## 2019-01-29 ENCOUNTER — Ambulatory Visit: Payer: Self-pay | Attending: Family Medicine

## 2019-01-29 ENCOUNTER — Telehealth: Payer: Self-pay | Admitting: Family Medicine

## 2019-01-29 NOTE — Telephone Encounter (Signed)
I called Pt at 9:30am for the TELE visit with financial LVM informed her that will try again in 10 minutes, hopping she answer

## 2019-01-29 NOTE — Telephone Encounter (Signed)
I called back the PT at 9:45am to go thru the financial application LVM to call back an reschedule appt

## 2019-02-25 ENCOUNTER — Ambulatory Visit: Payer: Self-pay | Attending: Family Medicine

## 2019-02-25 ENCOUNTER — Other Ambulatory Visit: Payer: Self-pay

## 2019-02-25 ENCOUNTER — Telehealth: Payer: Self-pay | Admitting: Family Medicine

## 2019-02-25 NOTE — Telephone Encounter (Signed)
I called Pt to informed that her a TELE visit with financial is at 9am, LVM that I will call her back in 10 minutes to try to do the appt by phone

## 2019-02-25 NOTE — Telephone Encounter (Signed)
I called Pt again at 9:13am still  Did not answer, LVM that I will try again in 10 minutes if she does not answer she need to reschedule appt

## 2019-03-12 ENCOUNTER — Telehealth: Payer: Self-pay | Admitting: Family Medicine

## 2019-03-12 NOTE — Telephone Encounter (Signed)
Pt was sent a letter from financial dept. Inform them, that the application they submitted was incomplete, since they were missing some documentation at the time of the appointment, Pt need to reschedule and resubmit all new papers and application for [employer]FA and OC, P.S. old documents has been sent back to Pt and need to make a new appt °

## 2019-07-04 ENCOUNTER — Other Ambulatory Visit: Payer: Self-pay

## 2019-07-04 ENCOUNTER — Ambulatory Visit (HOSPITAL_COMMUNITY)
Admission: EM | Admit: 2019-07-04 | Discharge: 2019-07-04 | Discharge status: Against Medical Advice | Payer: Medicaid Other

## 2019-07-04 ENCOUNTER — Ambulatory Visit: Payer: Medicaid Other | Admitting: Obstetrics and Gynecology

## 2019-07-04 NOTE — ED Notes (Signed)
Patient called multiple times for registration with no response. 

## 2019-07-15 ENCOUNTER — Ambulatory Visit: Payer: Self-pay | Attending: Family Medicine | Admitting: Family Medicine

## 2019-07-15 ENCOUNTER — Other Ambulatory Visit: Payer: Self-pay

## 2019-07-15 ENCOUNTER — Encounter: Payer: Self-pay | Admitting: Family Medicine

## 2019-07-15 DIAGNOSIS — M2550 Pain in unspecified joint: Secondary | ICD-10-CM

## 2019-07-15 DIAGNOSIS — R768 Other specified abnormal immunological findings in serum: Secondary | ICD-10-CM

## 2019-07-15 MED ORDER — NAPROXEN 500 MG PO TABS: 500.0000 mg | ORAL_TABLET | Freq: Two times a day (BID) | ORAL | 2 refills | Status: DC

## 2019-07-15 MED ORDER — METHOCARBAMOL 500 MG PO TABS: 500.0000 mg | ORAL_TABLET | Freq: Three times a day (TID) | ORAL | 2 refills | Status: DC | PRN

## 2019-07-15 MED FILL — ?NAPROXEN 500 MG TABS: 500 | 30 days supply | Qty: 60 | Fill #0

## 2019-07-15 MED FILL — METHOCARBAMOL 500 MG TABS: 500 | 20 days supply | Qty: 60 | Fill #0

## 2019-07-15 NOTE — Progress Notes (Signed)
Patient has been called and DOB has been verified. Patient has been screened and transferred to PCP to start phone visit.     

## 2019-07-15 NOTE — Progress Notes (Signed)
Virtual Visit via Telephone Note  I connected with Megan [name], on 07/15/2019 at 11:17 AM by telephone due to the COVID-19 pandemic and verified that I am speaking with the correct person using two identifiers.   Consent: I discussed the limitations, risks, security and privacy concerns of performing an evaluation and management service by telephone and the availability of in person appointments. I also discussed with the patient that there may be a patient responsible charge related to this service. The patient expressed understanding and agreed to proceed.   Location of Patient: Home  Location of Provider: Clinic   Persons participating in Telemedicine visit: Megan [name] [address] [place], [place] Dr. Margarita Rana     History of Present Illness: Megan [name]  is a 28 year old left handed female with a history of polycystic ovarian syndrome who presents today for follow-up visit  She complains of general body aches which she had also complained of one year ago and labs had revealed negative anti double stranded DNA, ANA, anti smith ab, CCP, sed rate but RF was elevated at 23.8 (Normal 0-13.9).  She gives a positive family history of Rheumatoid Arthritis in Mom. Pain occurs in her bones and she applies heat therapy for relief; pain is described as 8/10 Endorses swelling of her joints in her wrists and fingers. She has run out of her prescription medications.  Past Medical History:  Diagnosis Date  . Polycystic ovaries 07/24/2015   No Known Allergies  Current Outpatient Medications on File Prior to Visit  Medication Sig Dispense Refill  . sulfamethoxazole-trimethoprim (BACTRIM DS) 800-160 MG tablet Take 1 tablet by mouth 2 (two) times daily. (Patient not taking: Reported on 07/15/2019) 10 tablet 0   No current facility-administered medications on file prior to visit.    Observations/Objective: Alert, awake, oriented x3 Not in acute  distress  Assessment and Plan: 1. Arthralgia, unspecified joint Uncontrolled Initiate NSAID and muscle relaxant and will refer to rheumatology again - Ambulatory referral to Rheumatology - naproxen (NAPROSYN) 500 MG tablet; Take 1 tablet (500 mg total) by mouth 2 (two) times daily with a meal.  Dispense: 60 tablet; Refill: 2 - methocarbamol (ROBAXIN) 500 MG tablet; Take 1 tablet (500 mg total) by mouth every 8 (eight) hours as needed for muscle spasms.  Dispense: 60 tablet; Refill: 2  2. Rheumatoid factor positive Given family history of rheumatoid arthritis in the setting of positive rheumatoid factor she would need to be evaluated by rheumatology even though her CCP is negative - Ambulatory referral to Rheumatology  Follow Up Instructions: Return in about 3 months (around 10/13/2019) for arthralgia.    I discussed the assessment and treatment plan with the patient. The patient was provided an opportunity to ask questions and all were answered. The patient agreed with the plan and demonstrated an understanding of the instructions.   The patient was advised to call back or seek an in-person evaluation if the symptoms worsen or if the condition fails to improve as anticipated.     I provided 15 minutes total of non-face-to-face time during this encounter including median intraservice time, reviewing previous notes, labs, imaging, medications, management and patient verbalized understanding.     Charlott Rakes, MD, FAAFP. [facility] and [place], [place]   07/15/2019, 11:17 AM

## 2020-03-05 ENCOUNTER — Institutional Professional Consult (permissible substitution): Payer: Medicaid Other | Admitting: Licensed Clinical Social Worker

## 2020-03-05 ENCOUNTER — Telehealth: Payer: Self-pay | Admitting: Licensed Clinical Social Worker

## 2020-03-05 NOTE — Telephone Encounter (Signed)
Call placed to patient utilizing [employer] [phone]) regarding scheduled [facility] appointment. LCSW left message requesting a return call.

## 2020-03-09 ENCOUNTER — Ambulatory Visit: Payer: Medicaid Other | Attending: Family Medicine | Admitting: Licensed Clinical Social Worker

## 2020-03-09 ENCOUNTER — Other Ambulatory Visit: Payer: Self-pay

## 2020-03-09 DIAGNOSIS — Z599 Problem related to housing and economic circumstances, unspecified: Secondary | ICD-10-CM

## 2020-03-25 NOTE — BH Specialist Note (Signed)
LCSW introduced self and explained well at community care clinics utilizing [facility] interpreters.  Patient shared that she received paperwork from current apartment complex requesting patient move out of unit at the end of the month.  LCSW discussed partnership with legal aid and received patient's consent to complete referral.  Patient was appreciative for the assistance

## 2020-04-20 IMAGING — DX DG HAND COMPLETE 3+V*L*
3 series · 3 of 3 positions shown · non-contrast
Comparison: None.

CLINICAL DATA: Bilateral hand arthralgia

EXAM:
LEFT HAND - COMPLETE 3+ VIEW

[hand pa]
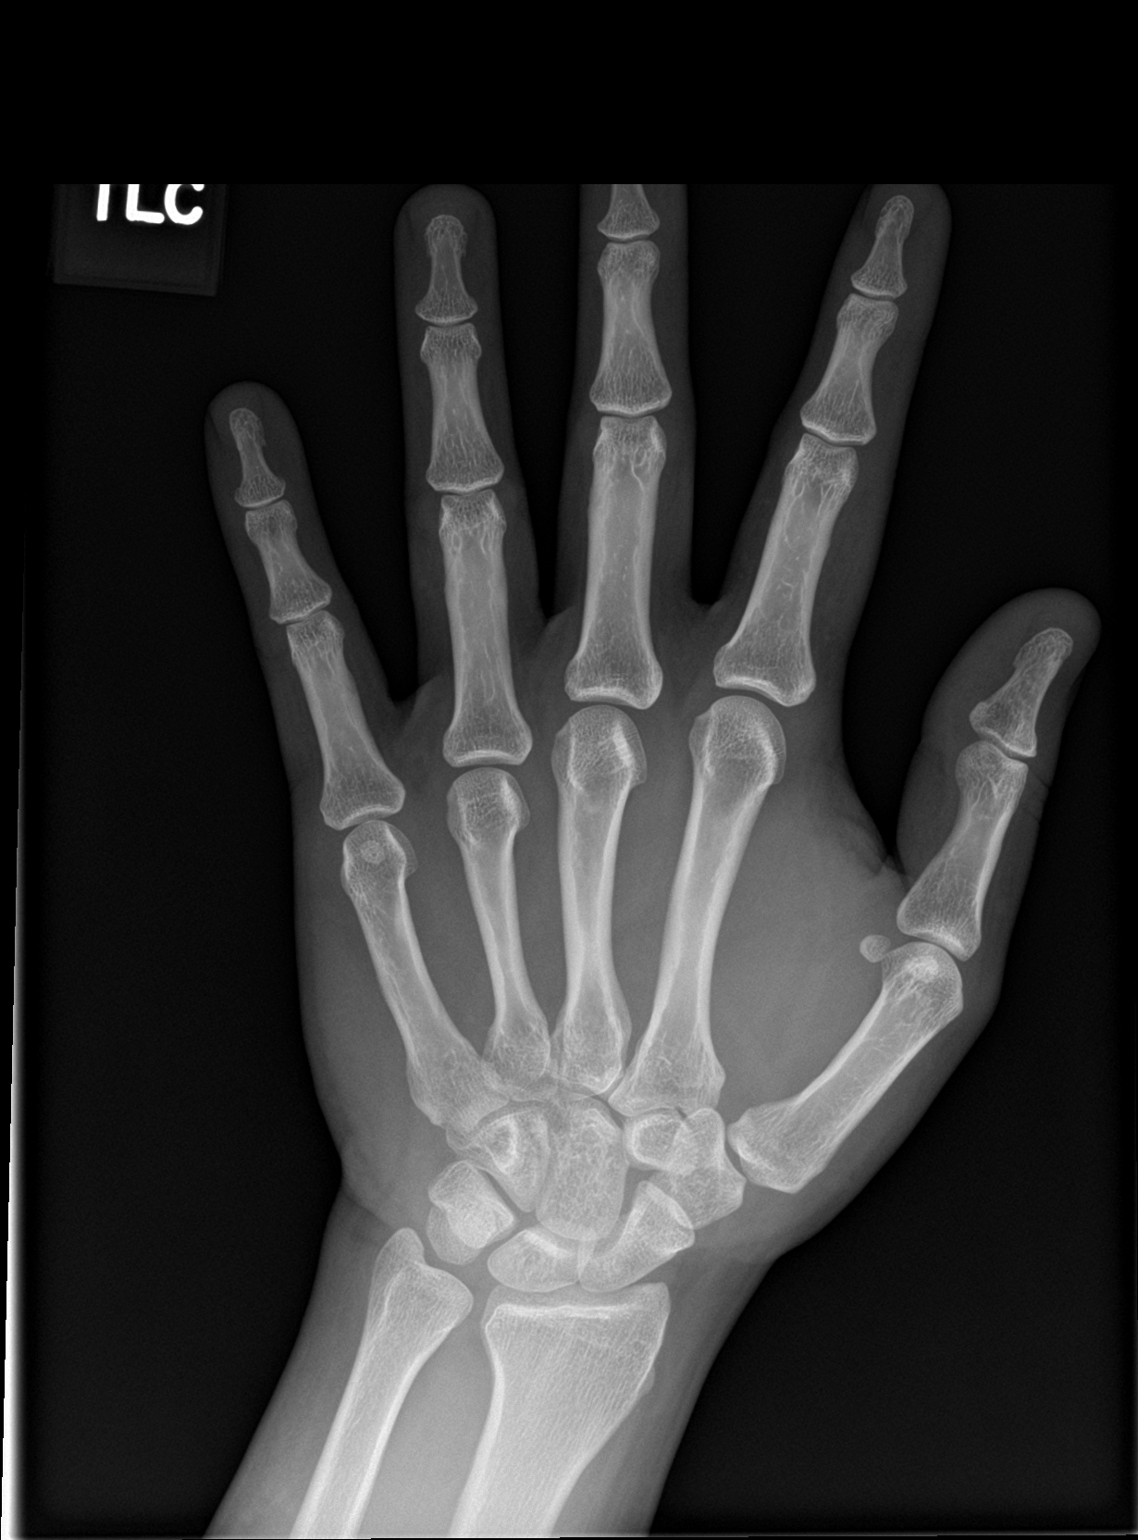

[hand obl]
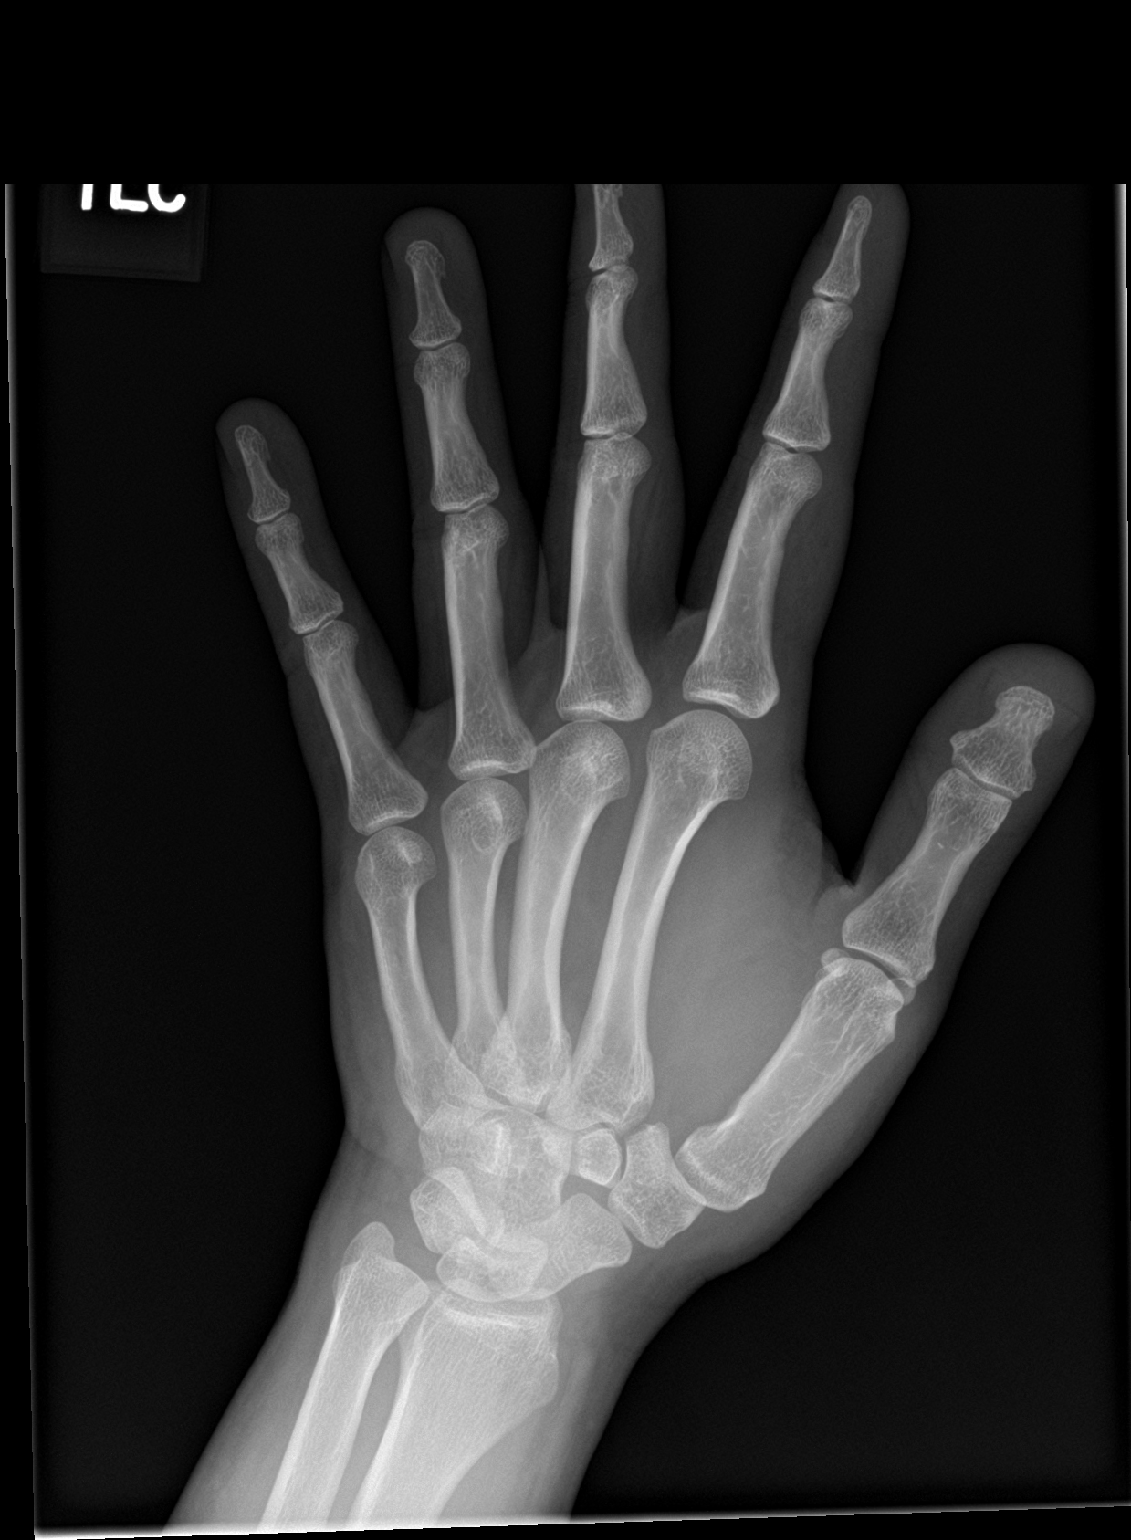

[hand lat]
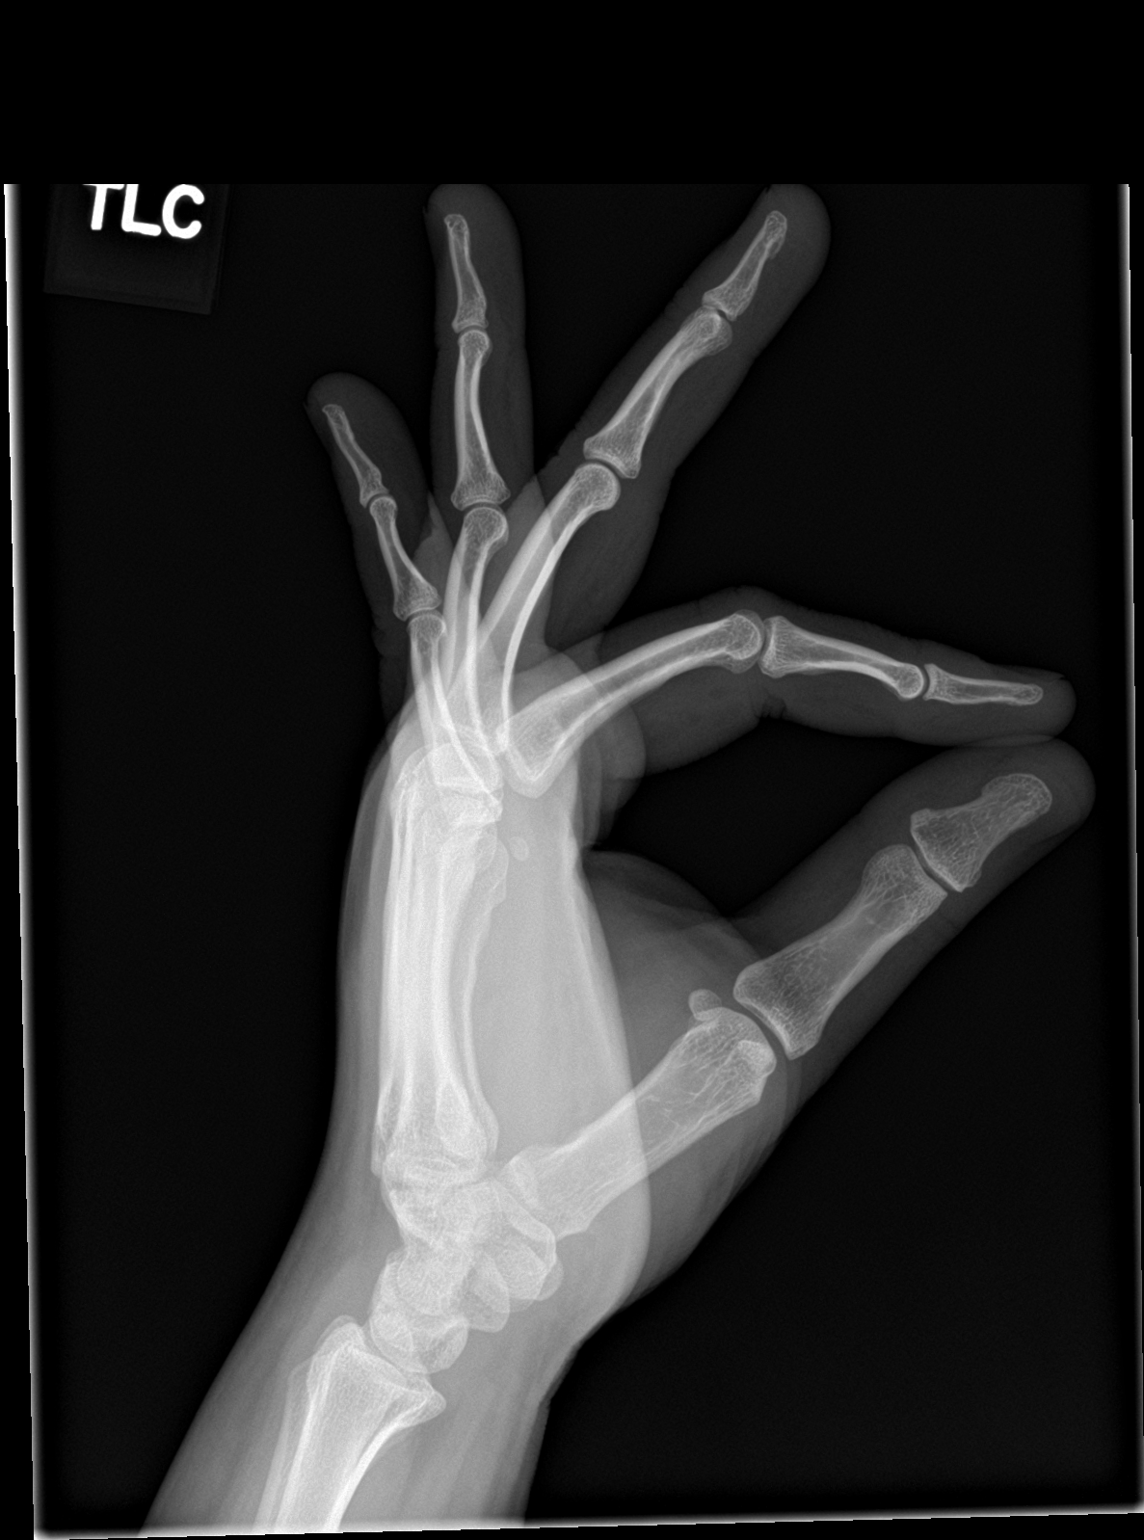

[3 of 3 positions shown; findings below may reference images not displayed]

FINDINGS: There is no evidence of fracture or dislocation. There is no
evidence of arthropathy or other focal bone abnormality. Soft
tissues are unremarkable.
IMPRESSION: Negative.

## 2020-04-20 IMAGING — DX DG HAND COMPLETE 3+V*R*
3 series · 3 of 3 positions shown · non-contrast
Comparison: None.

CLINICAL DATA: Arthralgia both hands

EXAM:
RIGHT HAND - COMPLETE 3+ VIEW

[hand pa]
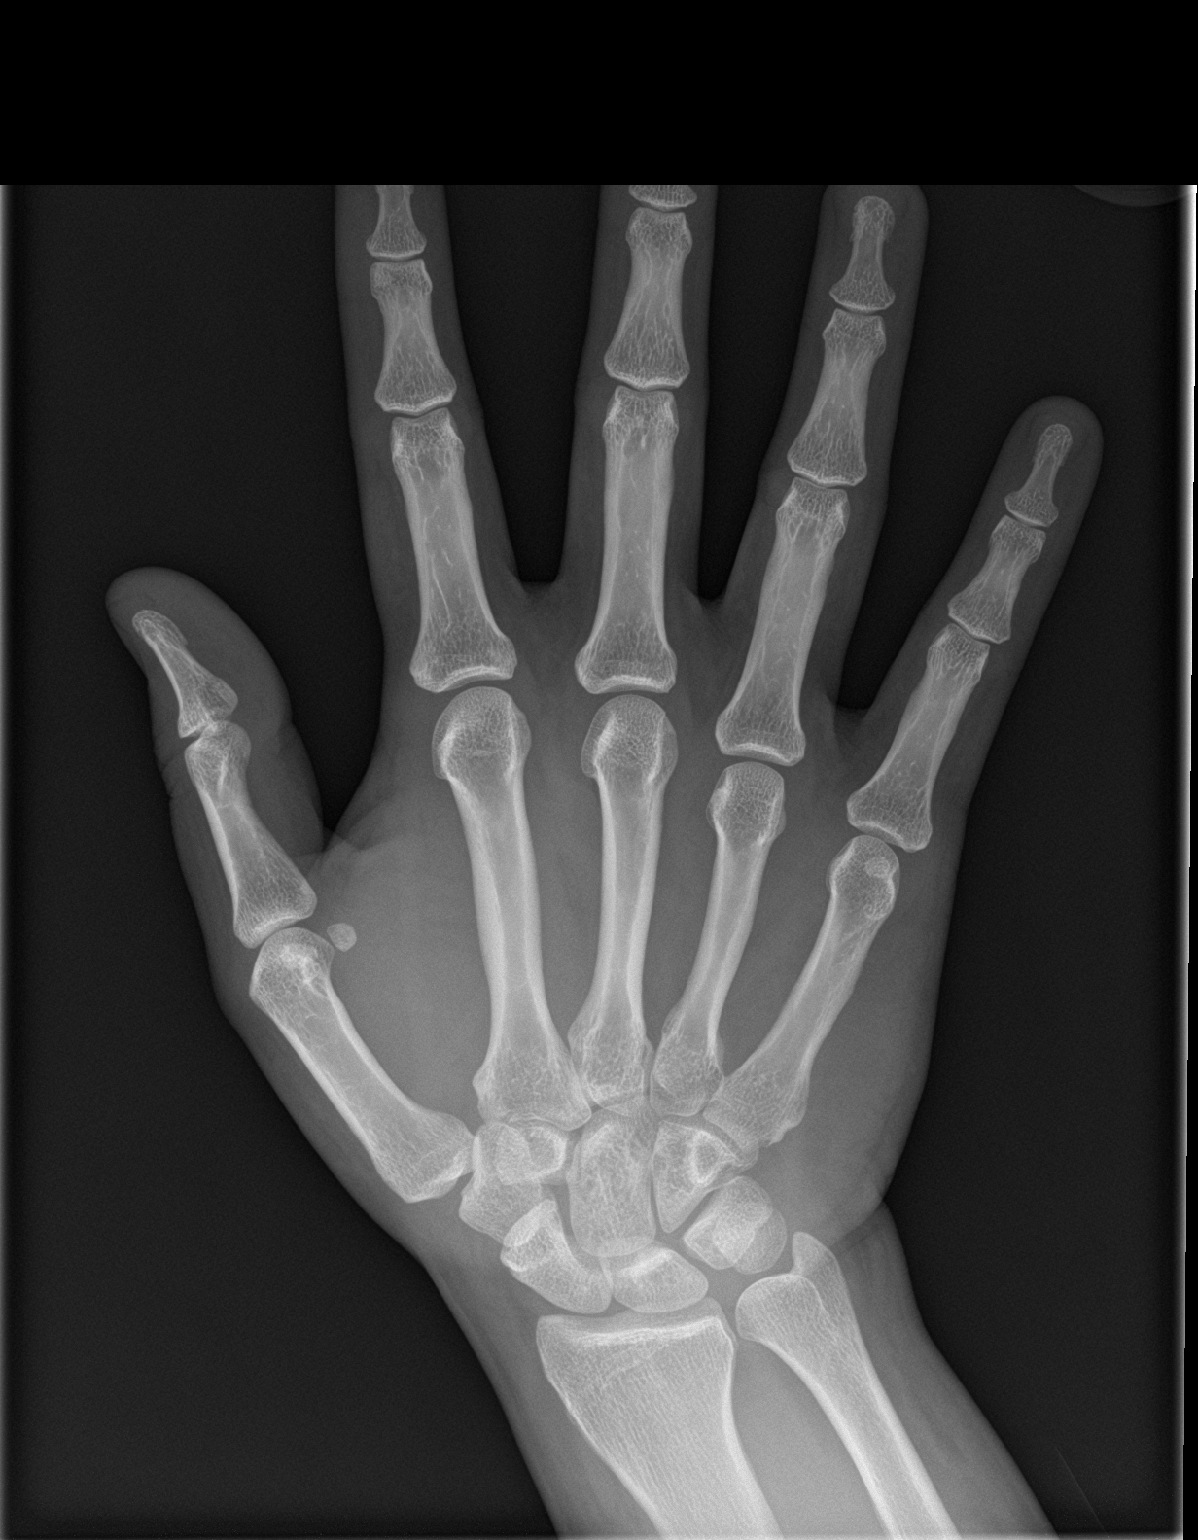

[hand obl]
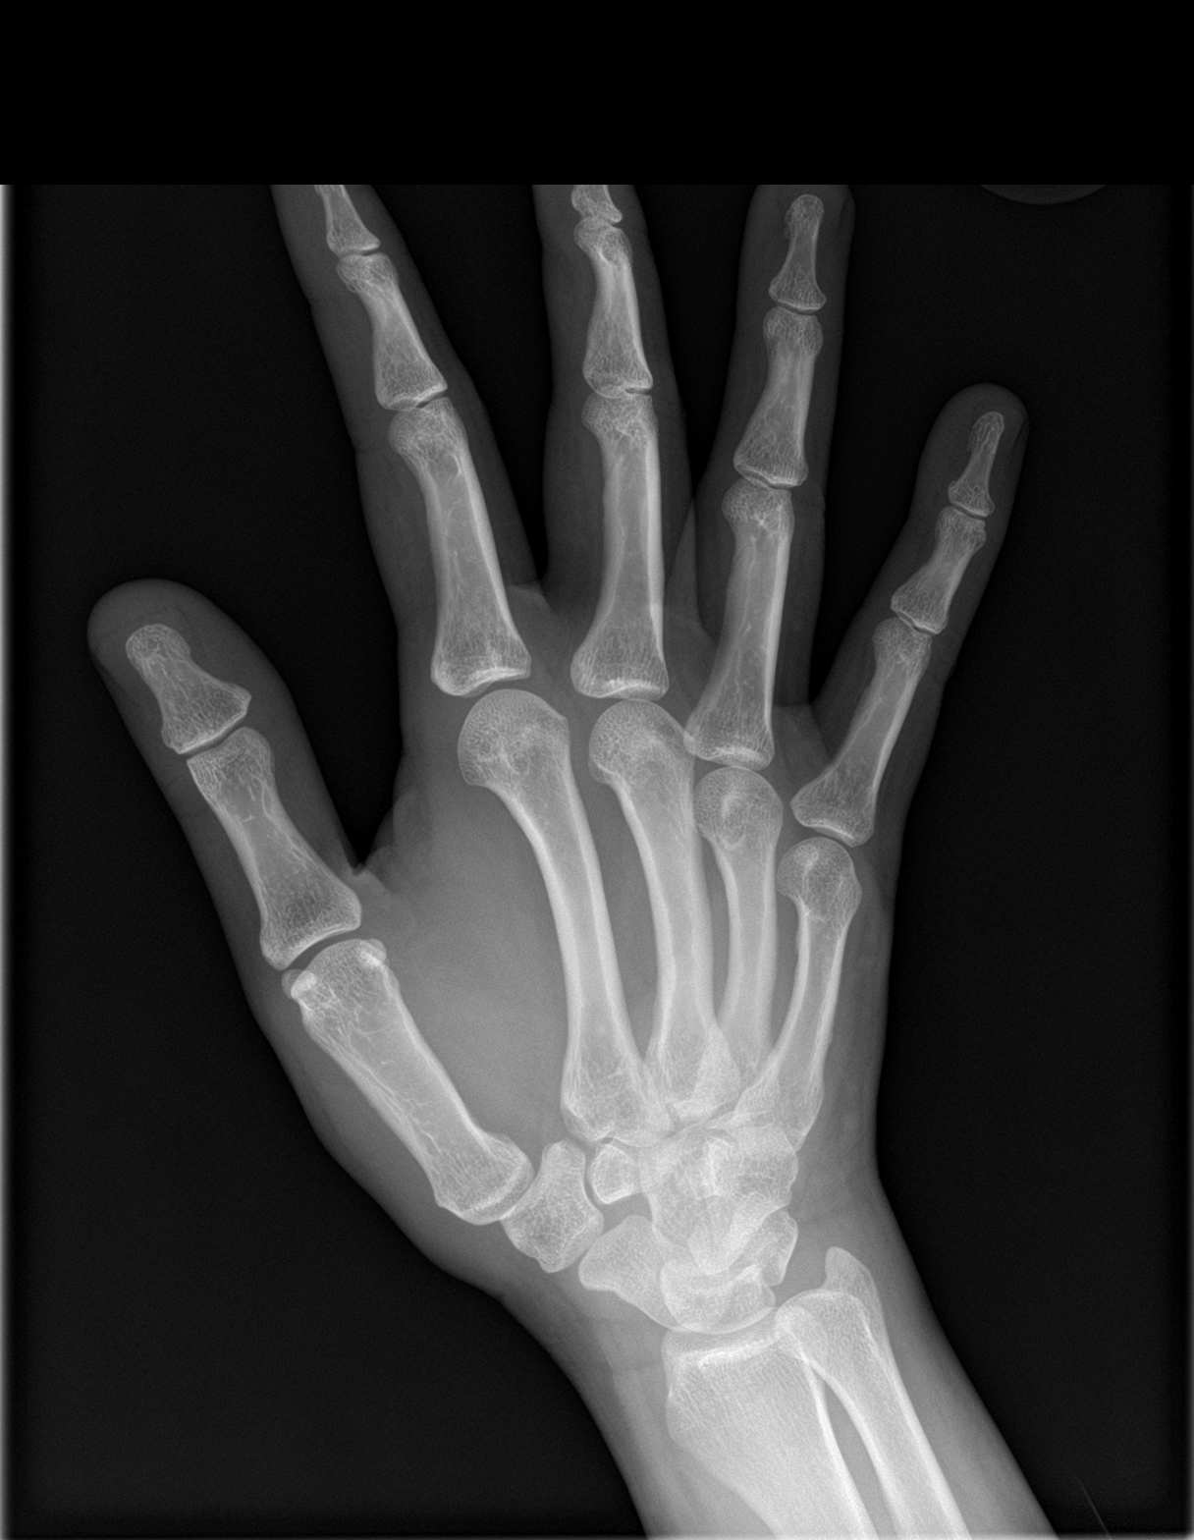

[hand lat]
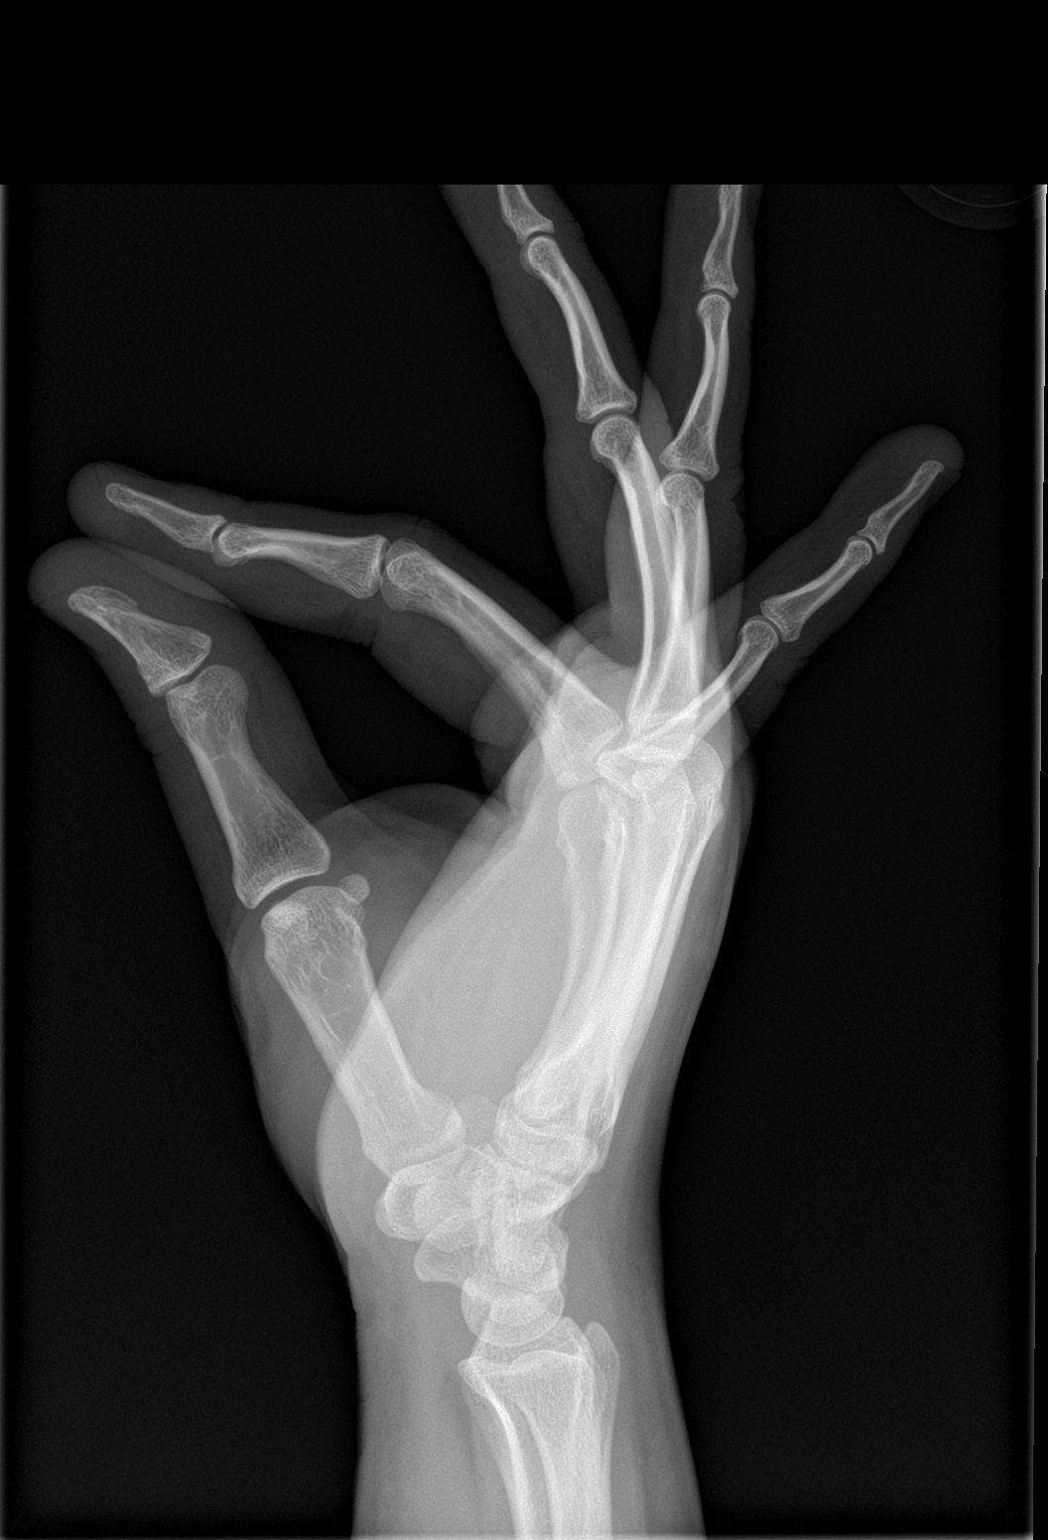

[3 of 3 positions shown; findings below may reference images not displayed]

FINDINGS: There is no evidence of fracture or dislocation. There is no
evidence of arthropathy or other focal bone abnormality. Soft
tissues are unremarkable.
IMPRESSION: Negative.

## 2020-06-02 ENCOUNTER — Encounter: Payer: Self-pay | Admitting: Family Medicine

## 2020-06-02 ENCOUNTER — Other Ambulatory Visit: Payer: Self-pay

## 2020-06-02 ENCOUNTER — Ambulatory Visit: Payer: Medicaid Other | Attending: Family Medicine | Admitting: Family Medicine

## 2020-06-02 VITALS — BP 117/72 | HR 91 | Temp 98.2°F | Ht 63.0 in | Wt 176.4 lb

## 2020-06-02 DIAGNOSIS — Z3491 Encounter for supervision of normal pregnancy, unspecified, first trimester: Secondary | ICD-10-CM

## 2020-06-02 DIAGNOSIS — R42 Dizziness and giddiness: Secondary | ICD-10-CM | POA: Diagnosis not present

## 2020-06-02 LAB — POCT URINE PREGNANCY: Preg Test, Ur: POSITIVE — AB

## 2020-06-02 MED ORDER — PRENATAL 27-0.8 MG PO TABS: 1.0000 | ORAL_TABLET | Freq: Every day | ORAL | 1 refills | Status: DC

## 2020-06-02 NOTE — Progress Notes (Signed)
LMP-03/09/2020  Has been having headaches and dizziness.

## 2020-06-02 NOTE — Progress Notes (Signed)
Subjective:  Patient ID: Megan [name], female    DOB: December 29, 1990  Age: 29 y.o. MRN: [id number]  CC: Headache and Possible Pregnancy   HPI Megan [name] presents with concerns of possible pregnancy; LMP was on 03/09/20. She has had some headaches and dizziness also endorses presence of nausea. Denies vaginal spotting or urinary symptoms. She is currently not under OB/GYN care.  Past Medical History:  Diagnosis Date  . Polycystic ovaries 07/24/2015    No past surgical history on file.  Family History  Problem Relation Age of Onset  . Diabetes Mother   . Hypertension Mother     No Known Allergies  Outpatient Medications Prior to Visit  Medication Sig Dispense Refill  . methocarbamol (ROBAXIN) 500 MG tablet Take 1 tablet (500 mg total) by mouth every 8 (eight) hours as needed for muscle spasms. (Patient not taking: Reported on 06/02/2020) 60 tablet 2  . naproxen (NAPROSYN) 500 MG tablet Take 1 tablet (500 mg total) by mouth 2 (two) times daily with a meal. (Patient not taking: Reported on 06/02/2020) 60 tablet 2  . sulfamethoxazole-trimethoprim (BACTRIM DS) 800-160 MG tablet Take 1 tablet by mouth 2 (two) times daily. (Patient not taking: Reported on 07/15/2019) 10 tablet 0   No facility-administered medications prior to visit.     ROS Review of Systems  Constitutional: Negative for activity change, appetite change and fatigue.  HENT: Negative for congestion, sinus pressure and sore throat.   Eyes: Negative for visual disturbance.  Respiratory: Negative for cough, chest tightness, shortness of breath and wheezing.   Cardiovascular: Negative for chest pain and palpitations.  Gastrointestinal: Negative for abdominal distention, abdominal pain and constipation.  Endocrine: Negative for polydipsia.  Genitourinary: Negative for dysuria and frequency.  Musculoskeletal: Negative for arthralgias and back pain.  Skin: Negative for rash.  Neurological: Positive for dizziness and  headaches. Negative for tremors and numbness.  Hematological: Does not bruise/bleed easily.  Psychiatric/Behavioral: Negative for agitation and behavioral problems.    Objective:  BP 117/72   Pulse 91   Temp 98.2 F (36.8 C) (Oral)   Ht 5\' 3"  (1.6 m)   Wt 176 lb 6.4 oz (80 kg)   LMP 03/09/2020   SpO2 98%   BMI 31.25 kg/m   BP/Weight 06/02/2020 01/16/2019 05/16/2018  Systolic BP 117 - 138  Diastolic BP 72 - 71  Wt. (Lbs) 176.4 163 170.5  BMI 31.25 28.87 30.2      Physical Exam Constitutional:      Appearance: She is well-developed.  Neck:     Vascular: No JVD.  Cardiovascular:     Rate and Rhythm: Normal rate.     Heart sounds: Normal heart sounds. No murmur heard.   Pulmonary:     Effort: Pulmonary effort is normal.     Breath sounds: Normal breath sounds. No wheezing or rales.  Chest:     Chest wall: No tenderness.  Abdominal:     General: Bowel sounds are normal. There is no distension.     Palpations: Abdomen is soft. There is no mass.     Tenderness: There is no abdominal tenderness.  Musculoskeletal:        General: Normal range of motion.     Right lower leg: No edema.     Left lower leg: No edema.  Neurological:     Mental Status: She is alert and oriented to person, place, and time.  Psychiatric:        Mood and Affect: Mood normal.  CMP Latest Ref Rng & Units 12/22/2017 07/09/2015 06/27/2014  Glucose 65 - 99 mg/dL 76 76 76  BUN 6 - 20 mg/dL 7 9 9   Creatinine 0.57 - 1.00 mg/dL ) 4.59(X 7.74  Sodium 134 - 144 mmol/L 139 136 139  Potassium 3.5 - 5.2 mmol/L 4.2 4.0 4.7  Chloride 96 - 106 mmol/L 104 103 104  CO2 20 - 29 mmol/L 20 22 27   Calcium 8.7 - 10.2 mg/dL 9.4 9.4 9.5  Total Protein 6.0 - 8.5 g/dL 7.6 7.8 7.5  Total Bilirubin 0.0 - 1.2 mg/dL 0.4 0.5 0.3  Alkaline Phos 39 - 117 IU/L 44 55 52  AST 0 - 40 IU/L 27 20 21   ALT 0 - 32 IU/L 27 17 20     Lipid Panel     Component Value Date/Time   CHOL 161 08/17/2015 0948   TRIG 123  08/17/2015 0948   HDL 38 (L) 08/17/2015 0948   CHOLHDL 4.2 08/17/2015 0948   VLDL 25 08/17/2015 0948   LDLCALC 98 08/17/2015 0948    CBC    Component Value Date/Time   WBC 9.4 12/22/2017 1105   WBC 9.5 03/02/2016 1045   RBC 4.85 12/22/2017 1105   RBC 5.20 (H) 03/02/2016 1045   HGB 13.7 12/22/2017 1105   HCT 40.7 12/22/2017 1105   PLT 341 12/22/2017 1105   MCV 84 12/22/2017 1105   MCH 28.2 12/22/2017 1105   MCH 27.7 03/02/2016 1045   MCHC 33.7 12/22/2017 1105   MCHC 33.1 03/02/2016 1045   RDW 13.5 12/22/2017 1105   LYMPHSABS 3.1 12/22/2017 1105   MONOABS 1.0 12/21/2013 2035   EOSABS 0.2 12/22/2017 1105   BASOSABS 0.1 12/22/2017 1105    Lab Results  Component Value Date   HGBA1C 5.6 08/23/2016    Assessment & Plan:  1. First trimester pregnancy Referral has been placed to OB/GYN - POCT urine pregnancy - Ambulatory referral to Gynecology - Prenatal Vit-Fe Fumarate-FA (MULTIVITAMIN-PRENATAL) 27-0.8 MG TABS tablet; Take 1 tablet by mouth daily at 12 noon.  Dispense: 30 tablet; Refill: 1  2. Dizziness Likely secondary to #1 above Advised to change positions slowly, ingest adequate amounts of iron rich foods  3.  Headaches Advised to use Tylenol as needed  No orders of the defined types were placed in this encounter.   Follow-up: No follow-ups on file.       2036, MD, FAAFP. [facility] and [facility] [place], 08/25/2016 Hoy Register   06/02/2020, 9:16 AM

## 2020-06-18 ENCOUNTER — Encounter (HOSPITAL_COMMUNITY): Payer: Self-pay

## 2020-06-18 ENCOUNTER — Emergency Department (HOSPITAL_COMMUNITY)
Admission: EM | Admit: 2020-06-18 | Discharge: 2020-06-18 | Disposition: A | Discharge status: Home / Self Care | Payer: Medicaid Other | Attending: Emergency Medicine | Admitting: Emergency Medicine

## 2020-06-18 DIAGNOSIS — Z3A00 Weeks of gestation of pregnancy not specified: Secondary | ICD-10-CM | POA: Diagnosis not present

## 2020-06-18 DIAGNOSIS — Z20822 Contact with and (suspected) exposure to covid-19: Secondary | ICD-10-CM | POA: Insufficient documentation

## 2020-06-18 DIAGNOSIS — J069 Acute upper respiratory infection, unspecified: Secondary | ICD-10-CM | POA: Insufficient documentation

## 2020-06-18 DIAGNOSIS — O99511 Diseases of the respiratory system complicating pregnancy, first trimester: Secondary | ICD-10-CM | POA: Insufficient documentation

## 2020-06-18 DIAGNOSIS — O26891 Other specified pregnancy related conditions, first trimester: Secondary | ICD-10-CM | POA: Diagnosis present

## 2020-06-18 LAB — BASIC METABOLIC PANEL
Anion gap: 13 (ref 5–15)
BUN: <5 mg/dL — ABNORMAL LOW (ref 6–20)
CO2: 19 mmol/L — ABNORMAL LOW (ref 22–32)
Calcium: 9.2 mg/dL (ref 8.9–10.3)
Chloride: 104 mmol/L (ref 98–111)
Creatinine, Ser: 0.42 mg/dL — ABNORMAL LOW (ref 0.44–1.00)
GFR, Estimated: >60 mL/min (ref >60)
Glucose, Bld: 86 mg/dL (ref 70–99)
Potassium: 3.6 mmol/L (ref 3.5–5.1)
Sodium: 136 mmol/L (ref 135–145)

## 2020-06-18 LAB — URINALYSIS, ROUTINE W REFLEX MICROSCOPIC
Bilirubin Urine: NEGATIVE
Glucose, UA: NEGATIVE mg/dL
Hgb urine dipstick: NEGATIVE
Ketones, ur: NEGATIVE mg/dL
Leukocytes,Ua: NEGATIVE
Nitrite: NEGATIVE
Protein, ur: NEGATIVE mg/dL
Specific Gravity, Urine: 1.003 — ABNORMAL LOW (ref 1.005–1.030)
pH: 8 (ref 5.0–8.0)

## 2020-06-18 LAB — HCG, QUANTITATIVE, PREGNANCY: hCG, Beta Chain, Quant, S: 61987 m[IU]/mL — ABNORMAL HIGH (ref <5)

## 2020-06-18 LAB — CBC
HCT: 43 % (ref 36.0–46.0)
Hemoglobin: 13.9 g/dL (ref 12.0–15.0)
MCH: 28.3 pg (ref 26.0–34.0)
MCHC: 32.3 g/dL (ref 30.0–36.0)
MCV: 87.6 fL (ref 80.0–100.0)
Platelets: 347 10*3/uL (ref 150–400)
RBC: 4.91 MIL/uL (ref 3.87–5.11)
RDW: 13.1 % (ref 11.5–15.5)
WBC: 17.4 10*3/uL — ABNORMAL HIGH (ref 4.0–10.5)
nRBC: 0 % (ref 0.0–0.2)

## 2020-06-18 LAB — RESP PANEL BY RT-PCR (FLU A&B, COVID) ARPGX2
Influenza A by PCR: NEGATIVE
Influenza B by PCR: NEGATIVE
SARS Coronavirus 2 by RT PCR: NEGATIVE

## 2020-06-18 MED ORDER — FLUTICASONE PROPIONATE 50 MCG/ACT NA SUSP
1.0000 | Freq: Once | NASAL | Status: AC
  Administered 2020-06-18: 1 via NASAL
  Filled 2020-06-18: qty 16

## 2020-06-18 MED ORDER — SODIUM CHLORIDE 0.9 % IV BOLUS
1000.0000 mL | Freq: Once | INTRAVENOUS | Status: AC
  Administered 2020-06-18: 1000 mL via INTRAVENOUS

## 2020-06-18 MED ORDER — ACETAMINOPHEN 325 MG PO TABS
650.0000 mg | ORAL_TABLET | Freq: Once | ORAL | Status: AC
  Administered 2020-06-18: 650 mg via ORAL
  Filled 2020-06-18: qty 2

## 2020-06-18 NOTE — ED Provider Notes (Signed)
[facility] EMERGENCY DEPARTMENT Provider Note   CSN: [id number] Arrival date & time: 06/18/20  1326     History Chief Complaint  Patient presents with  . Cough    Megan [name] is a 29 y.o. female with past medical history significant for elevated rheumatoid factor, chronic bilateral back pain, varicose veins, polycystic ovaries, PAC, cervical radiculopathy. G3P2. Had covid vaccinations.  HPI Patient presents to emergency department with chief complaint of nonproductive cough x 3 days. She is also endorsing nasal congestion, body aches, headache, subjective fever. She denies any associated neck pain or stiffness. She has no sick contacts or known covid exposures. She has not tried any medications at home for her symptoms because she did not know what would be safe during pregnancy.  She denies headache currently.  She estimates being 3 months pregnant, has first OB appointment scheduled in 5 days. She denies chills, otalgia, sore throat, hemoptysis, chest pain, shortness of breath, abdominal pain, pelvic pain, vaginal bleeding, dysuria, urinary frequency, hematuria, diarrhea.   Due to language barrier, a video interpreter was present during the history-taking and subsequent discussion (and for part of the physical exam) with this patient.   Past Medical History:  Diagnosis Date  . Polycystic ovaries 07/24/2015    Patient Active Problem List   Diagnosis Date Noted  . Elevated rheumatoid factor 02/13/2017  . Chronic bilateral low back pain without sciatica 10/25/2016  . Varicose vein of leg 04/08/2016  . Depression 04/08/2016  . Vitamin D insufficiency 03/03/2016  . Irregular menstrual cycle 03/02/2016  . Polycystic ovaries 07/24/2015  . Allergic rhinitis 12/08/2014  . PAC (premature atrial contraction) 06/27/2014  . Fatigue 06/27/2014  . Cervical radiculopathy at C6 06/27/2014    History reviewed. No pertinent surgical history.   OB History    Gravida   2   Para  2   Term      Preterm      AB      Living  2     SAB      TAB      Ectopic      Multiple      Live Births              Family History  Problem Relation Age of Onset  . Diabetes Mother   . Hypertension Mother     Social History   Tobacco Use  . Smoking status: Never Smoker  . Smokeless tobacco: Never Used  Substance Use Topics  . Alcohol use: No  . Drug use: No    Home Medications Prior to Admission medications   Medication Sig Start Date End Date Taking? Authorizing Provider  methocarbamol (ROBAXIN) 500 MG tablet Take 1 tablet (500 mg total) by mouth every 8 (eight) hours as needed for muscle spasms. Patient not taking: Reported on 06/02/2020 07/15/19   Hoy Register, MD  naproxen (NAPROSYN) 500 MG tablet Take 1 tablet (500 mg total) by mouth 2 (two) times daily with a meal. Patient not taking: Reported on 06/02/2020 07/15/19   Hoy Register, MD  Prenatal Vit-Fe Fumarate-FA (MULTIVITAMIN-PRENATAL) 27-0.8 MG TABS tablet Take 1 tablet by mouth daily at 12 noon. 06/02/20   Hoy Register, MD  sulfamethoxazole-trimethoprim (BACTRIM DS) 800-160 MG tablet Take 1 tablet by mouth 2 (two) times daily. Patient not taking: Reported on 07/15/2019 01/16/19   Anders Simmonds, PA-C    Allergies    Patient has no known allergies.  Review of Systems   Review of  Systems All other systems are reviewed and are negative for acute change except as noted in the HPI.  Physical Exam Updated Vital Signs BP (!) 149/95   Pulse 92   Temp 98.7 F (37.1 C) (Oral)   Resp (!) 25   Ht  (1.6 m)   Wt 79.8 kg   SpO2 100%   BMI 31.18 kg/m   Physical Exam Vitals and nursing note reviewed.  Constitutional:      General: She is not in acute distress.    Appearance: She is not ill-appearing.  HENT:     Head: Normocephalic and atraumatic.     Right Ear: Tympanic membrane and external ear normal.     Left Ear: Tympanic membrane and external ear normal.      Nose: Congestion present.     Right Sinus: Maxillary sinus tenderness present. No frontal sinus tenderness.     Left Sinus: Maxillary sinus tenderness present. No frontal sinus tenderness.     Mouth/Throat:     Mouth: Mucous membranes are dry.     Pharynx: Oropharynx is clear.     Comments: No erythema to oropharynx, no edema, no exudate, no tonsillar swelling, voice normal, neck supple without lymphadenopathy  Eyes:     General: No scleral icterus.       Right eye: No discharge.        Left eye: No discharge.     Extraocular Movements: Extraocular movements intact.     Conjunctiva/sclera: Conjunctivae normal.     Pupils: Pupils are equal, round, and reactive to light.  Neck:     Vascular: No JVD.  Cardiovascular:     Rate and Rhythm: Normal rate and regular rhythm.     Pulses: Normal pulses.          Radial pulses are 2+ on the right side and 2+ on the left side.     Heart sounds: Normal heart sounds.  Pulmonary:     Comments: Lungs clear to auscultation in all fields. Symmetric chest rise. No wheezing, rales, or rhonchi.  Oxygen saturation is 100% on room air.  She is speaking full sentences.  No respiratory distress. Abdominal:     Tenderness: There is no right CVA tenderness or left CVA tenderness.     Comments: Gravid abdomen. Non-tender in all quadrants. No rigidity, no guarding. No peritoneal signs.  Musculoskeletal:        General: Normal range of motion.     Cervical back: Normal range of motion.  Skin:    General: Skin is warm and dry.     Capillary Refill: Capillary refill takes less than 2 seconds.  Neurological:     Mental Status: She is oriented to person, place, and time.     GCS: GCS eye subscore is 4. GCS verbal subscore is 5. GCS motor subscore is 6.     Comments: Fluent speech, no facial droop.  Psychiatric:        Behavior: Behavior normal.     ED Results / Procedures / Treatments   Labs (all labs ordered are listed, but only abnormal results are  displayed) Labs Reviewed  CBC - Abnormal; Notable for the following components:      Result Value   WBC 17.4 (*)    All other components within normal limits  BASIC METABOLIC PANEL - Abnormal; Notable for the following components:   CO2 19 (*)    BUN <5 (*)    Creatinine, Ser 0.42 (*)  All other components within normal limits  URINALYSIS, ROUTINE W REFLEX MICROSCOPIC - Abnormal; Notable for the following components:   Color, Urine STRAW (*)    APPearance HAZY (*)    Specific Gravity, Urine 1.003 (*)    All other components within normal limits  HCG, QUANTITATIVE, PREGNANCY - Abnormal; Notable for the following components:   hCG, Beta Chain, Quant, S 18,841 (*)    All other components within normal limits  RESP PANEL BY RT-PCR (FLU A&B, COVID) ARPGX2  URINE CULTURE  BETA HCG QUANT (REF LAB)    EKG None  Radiology No results found.  Procedures Procedures (including critical care time)  Medications Ordered in ED Medications  acetaminophen (TYLENOL) tablet 650 mg (650 mg Oral Given 06/18/20 1557)  fluticasone (FLONASE) 50 MCG/ACT nasal spray 1 spray (1 spray Each Nare Given 06/18/20 1614)  sodium chloride 0.9 % bolus 1,000 mL (0 mLs Intravenous Stopped 06/18/20 1746)    ED Course  I have reviewed the triage vital signs and the nursing notes.  Pertinent labs & imaging results that were available during my care of the patient were reviewed by me and considered in my medical decision making (see chart for details).    MDM Rules/Calculators/A&P                          History provided by patient with additional history obtained from chart review.    31 old female presenting with URI symptoms.  On ED arrival she is afebrile.  She is known to be tachycardic to 103 in triage, dynamically stable.  No hypoxia.  On my exam patient looks to not feel well however is nontoxic in appearance.  Tachycardia had resolved, heart rate is in the 80s.  She has nasal congestion, maxillary  sinus tenderness.  Erythema surrounding the nose.  No signs of strep throat.  Her lungs are clear to auscultation all fields and she has normal work of breathing.  Abdomen is nontender to palpation.  Clinically it looks as if the patient has URI.   Labs were collected in triage.  I viewed results which show a nodular leukocytosis of 17.4, no anemia.  BMP with slightly low bicarb at 19, BUN/creatinine at baseline, BUN is less than 5 and creatinine is 0.42.  Appears to be close to baseline.  She is a normal anion gap.  Covid and influenza tests are negative.  UA without signs of infection.  She has not had any formal OB appointments, beta hCG is 61,987.  Patient given Tylenol, Flonase and a liter of fluids.  Reassessed patient after interventions and she is resting comfortably.  She feels like her symptoms have improved.  Do not feel she needs antibiotics for sinus infection as her symptoms have been only present for x3 days and she has been afebrile.  She agrees to hold off on chest x-ray in the setting of pregnancy.  Engaged in shared decision making and patient would like to be discharged home.  I ambulated patient around the room and she did so without any tachycardia, dyspnea or hypoxia, oxygen saturation stayed above 95% on room air. Low suspicion for DVT.  She will continue Tylenol and Flonase at home.  Recommend getting a humidifier for symptomatic care as well.  The patient appears reasonably screened and/or stabilized for discharge and I doubt any other medical condition or other [facility] requiring further screening, evaluation, or treatment in the ED at this time prior  to discharge. The patient is safe for discharge with strict return precautions discussed. Recommend pcp follow up and that she keep OB appointment that is scheduled in 4 days as well.   BJYNWGN Incorvaia was evaluated in Emergency Department on 06/18/2020 for the symptoms described in the history of present illness. She was evaluated in the  context of the global COVID-19 pandemic, which necessitated consideration that the patient might be at risk for infection with the SARS-CoV-2 virus that causes COVID-19. Institutional protocols and algorithms that pertain to the evaluation of patients at risk for COVID-19 are in a state of rapid change based on information released by regulatory bodies including the CDC and federal and state organizations. These policies and algorithms were followed during the patient's care in the ED.   Portions of this note were generated with Scientist, clinical (histocompatibility and immunogenetics). Dictation errors may occur despite best attempts at proofreading.   Final Clinical Impression(s) / ED Diagnoses Final diagnoses:  Viral URI with cough    Rx / DC Orders ED Discharge Orders    None       Kandice Hams 06/18/20 Kizzie Fantasia, MD 06/19/20 [phone]

## 2020-06-18 NOTE — ED Triage Notes (Addendum)
Triage completed with interpreter:  Pt arrives to ED w/ c/o cough, nasal congestion, generalized body aches, and headache x days. Pt states she is approx 3 months pregnant.

## 2020-06-18 NOTE — Discharge Instructions (Signed)
Take Tylenol for your cold.  You can continue to use the Flonase help with your congestion.  Use 1 spray in each nostril once a day.  You can use a humidifier to help with your cold as well.  You can buy these at the drugstore.  It is important that you stay well-hydrated.  Drink lots of fluids.  Keep your follow-up appointment with your OB/GYN.  Return to the emergency department if you have worsening symptoms

## 2020-06-19 LAB — URINE CULTURE: Culture: <10000 — AB

## 2020-06-19 LAB — BETA HCG QUANT (REF LAB): hCG Quant: 49659 m[IU]/mL

## 2020-06-22 ENCOUNTER — Ambulatory Visit (INDEPENDENT_AMBULATORY_CARE_PROVIDER_SITE_OTHER): Payer: Medicaid Other | Admitting: Advanced Practice Midwife

## 2020-06-22 ENCOUNTER — Encounter: Payer: Self-pay | Admitting: Advanced Practice Midwife

## 2020-06-22 ENCOUNTER — Other Ambulatory Visit: Payer: Self-pay

## 2020-06-22 ENCOUNTER — Other Ambulatory Visit (HOSPITAL_COMMUNITY)
Admission: RE | Admit: 2020-06-22 | Discharge: 2020-06-22 | Disposition: A | Discharge status: Home / Self Care | Payer: Medicaid Other | Source: Ambulatory Visit | Attending: Advanced Practice Midwife | Admitting: Advanced Practice Midwife

## 2020-06-22 VITALS — BP 118/75 | HR 98 | Wt 175.6 lb

## 2020-06-22 DIAGNOSIS — Z789 Other specified health status: Secondary | ICD-10-CM | POA: Diagnosis not present

## 2020-06-22 DIAGNOSIS — Z3A15 15 weeks gestation of pregnancy: Secondary | ICD-10-CM

## 2020-06-22 DIAGNOSIS — O219 Vomiting of pregnancy, unspecified: Secondary | ICD-10-CM | POA: Diagnosis not present

## 2020-06-22 DIAGNOSIS — Z23 Encounter for immunization: Secondary | ICD-10-CM

## 2020-06-22 DIAGNOSIS — Z3492 Encounter for supervision of normal pregnancy, unspecified, second trimester: Secondary | ICD-10-CM | POA: Diagnosis present

## 2020-06-22 DIAGNOSIS — O0990 Supervision of high risk pregnancy, unspecified, unspecified trimester: Secondary | ICD-10-CM | POA: Insufficient documentation

## 2020-06-22 MED ORDER — ASPIRIN EC 81 MG PO TBEC: 81.0000 mg | DELAYED_RELEASE_TABLET | Freq: Every day | ORAL | 2 refills | Status: DC

## 2020-06-22 MED ORDER — PROMETHAZINE HCL 12.5 MG PO TABS: 12.5000 mg | ORAL_TABLET | Freq: Four times a day (QID) | ORAL | 0 refills | Status: DC | PRN

## 2020-06-22 NOTE — Patient Instructions (Signed)
How a Baby Grows During Pregnancy  Pregnancy begins when a female's sperm enters a female's egg (fertilization). Fertilization usually happens in one of the tubes (fallopian tubes) that connect the ovaries to the womb (uterus). The fertilized egg moves down the fallopian tube to the uterus. Once it reaches the uterus, it implants into the lining of the uterus and begins to grow. For the first 10 weeks, the fertilized egg is called an embryo. After 10 weeks, it is called a fetus. As the fetus continues to grow, it receives oxygen and nutrients through tissue (placenta) that grows to support the developing baby. The placenta is the life support system for the baby. It provides oxygen and nutrition and removes waste. Learning as much as you can about your pregnancy and how your baby is developing can help you enjoy the experience. It can also make you aware of when there might be a problem and when to ask questions. How long does a typical pregnancy last? A pregnancy usually lasts 280 days, or about 40 weeks. Pregnancy is divided into three periods of growth, also called trimesters:  First trimester: 0-12 weeks.  Second trimester: 13-27 weeks.  Third trimester: 28-40 weeks. The day when your baby is ready to be born (full term) is your estimated date of delivery. How does my baby develop month by month? First month  The fertilized egg attaches to the inside of the uterus.  Some cells will form the placenta. Others will form the fetus.  The arms, legs, brain, spinal cord, lungs, and heart begin to develop.  At the end of the first month, the heart begins to beat. Second month  The bones, inner ear, eyelids, hands, and feet form.  The genitals develop.  By the end of 8 weeks, all major organs are developing. Third month  All of the internal organs are forming.  Teeth develop below the gums.  Bones and muscles begin to grow. The spine can flex.  The skin is transparent.  Fingernails  and toenails begin to form.  Arms and legs continue to grow longer, and hands and feet develop.  The fetus is about 3 inches (7.6 cm) long. Fourth month  The placenta is completely formed.  The external sex organs, neck, outer ear, eyebrows, eyelids, and fingernails are formed.  The fetus can hear, swallow, and move its arms and legs.  The kidneys begin to produce urine.  The skin is covered with a white, waxy coating (vernix) and very fine hair (lanugo). Fifth month  The fetus moves around more and can be felt for the first time (quickening).  The fetus starts to sleep and wake up and may begin to suck its finger.  The nails grow to the end of the fingers.  The organ in the digestive system that makes bile (gallbladder) functions and helps to digest nutrients.  If your baby is a girl, eggs are present in her ovaries. If your baby is a boy, testicles start to move down into his scrotum. Sixth month  The lungs are formed.  The eyes open. The brain continues to develop.  Your baby has fingerprints and toe prints. Your baby's hair grows thicker.  At the end of the second trimester, the fetus is about 9 inches (22.9 cm) long. Seventh month  The fetus kicks and stretches.  The eyes are developed enough to sense changes in light.  The hands can make a grasping motion.  The fetus responds to sound. Eighth month  All   organs and body systems are fully developed and functioning.  Bones harden, and taste buds develop. The fetus may hiccup.  Certain areas of the brain are still developing. The skull remains soft. Ninth month  The fetus gains about  lb (0.23 kg) each week.  The lungs are fully developed.  Patterns of sleep develop.  The fetus's head typically moves into a head-down position (vertex) in the uterus to prepare for birth.  The fetus weighs 6-9 lb (2.72-4.08 kg) and is 19-20 inches (48.26-50.8 cm) long. What can I do to have a healthy pregnancy and help  my baby develop? General instructions  Take prenatal vitamins as directed by your health care provider. These include vitamins such as folic acid, iron, calcium, and vitamin D. They are important for healthy development.  Take medicines only as directed by your health care provider. Read labels and ask a pharmacist or your health care provider whether over-the-counter medicines, supplements, and prescription drugs are safe to take during pregnancy.  Keep all follow-up visits as directed by your health care provider. This is important. Follow-up visits include prenatal care and screening tests. How do I know if my baby is developing well? At each prenatal visit, your health care provider will do several different tests to check on your health and keep track of your baby's development. These include:  Fundal height and position. ? Your health care provider will measure your growing belly from your pubic bone to the top of the uterus using a tape measure. ? Your health care provider will also feel your belly to determine your baby's position.  Heartbeat. ? An ultrasound in the first trimester can confirm pregnancy and show a heartbeat, depending on how far along you are. ? Your health care provider will check your baby's heart rate at every prenatal visit.  Second trimester ultrasound. ? This ultrasound checks your baby's development. It also may show your baby's gender. What should I do if I have concerns about my baby's development? Always talk with your health care provider about any concerns that you may have about your pregnancy and your baby. Summary  A pregnancy usually lasts 280 days, or about 40 weeks. Pregnancy is divided into three periods of growth, also called trimesters.  Your health care provider will monitor your baby's growth and development throughout your pregnancy.  Follow your health care provider's recommendations about taking prenatal vitamins and medicines during  your pregnancy.  Talk with your health care provider if you have any concerns about your pregnancy or your developing baby. This information is not intended to replace advice given to you by your health care provider. Make sure you discuss any questions you have with your health care provider. Document Revised: 11/01/2018 Document Reviewed: 05/24/2017 Elsevier Patient Education  2020 Elsevier Inc.  

## 2020-06-22 NOTE — Progress Notes (Signed)
History:   Megan [name] is a 29 y.o. G3P2 at 51w0dby LMP being seen today for her first obstetrical visit.  Her obstetrical history unremarkable and includes two term SVDs. Patient does intend to breast feed. Pregnancy history fully reviewed.  Patient reports fatigue and vomiting. She estimates that she vomits as many as 4 times per day, but does not vomit every day. She denies aggravating or alleviating factors. She has not taken medication or tried other treatments for this complaint.       HISTORY: OB History  Gravida Para Term Preterm AB Living  3 2 0 0 0 2  SAB TAB Ectopic Multiple Live Births  0 0 0 0 0    # Outcome Date GA Lbr Len/2nd Weight Sex Delivery Anes PTL Lv  3 Current           2 Para 05/18/11          1 Para 04/28/10    M        Patient does not think she has ever had a pap smear.  Past Medical History:  Diagnosis Date  . Polycystic ovaries 07/24/2015   History reviewed. No pertinent surgical history. Family History  Problem Relation Age of Onset  . Diabetes Mother   . Hypertension Mother    Social History   Tobacco Use  . Smoking status: Never Smoker  . Smokeless tobacco: Never Used  Substance Use Topics  . Alcohol use: No  . Drug use: No   No Known Allergies Current Outpatient Medications on File Prior to Visit  Medication Sig Dispense Refill  . Prenatal Vit-Fe Fumarate-FA (MULTIVITAMIN-PRENATAL) 27-0.8 MG TABS tablet Take 1 tablet by mouth daily at 12 noon. 30 tablet 1   No current facility-administered medications on file prior to visit.    Review of Systems Pertinent items noted in HPI and remainder of comprehensive ROS otherwise negative.  Physical Exam:   Vitals:   06/22/20 1450  BP: 118/75  Pulse: 98  Weight: 175 lb 9.6 oz (79.7 kg)   Fetal Heart Rate (bpm): 145  General: well-developed, well-nourished female in no acute distress  Breasts:  normal appearance, no masses or tenderness bilaterally  Skin: normal coloration and  turgor, no rashes  Neurologic: oriented, normal, negative, normal mood  Extremities: normal strength, tone, and muscle mass, ROM of all joints is normal  HEENT PERRLA, extraocular movement intact and sclera clear, anicteric  Neck supple and no masses  Cardiovascular: regular rate and rhythm  Respiratory:  no respiratory distress, normal breath sounds  Abdomen: soft, non-tender; bowel sounds normal; no masses,  no organomegaly  Pelvic: normal external genitalia, no lesions, normal vaginal mucosa, normal vaginal discharge, normal cervix, pap smear done.     Assessment:    Pregnancy: G3P2 Patient Active Problem List   Diagnosis Date Noted  . Supervision of low-risk pregnancy, second trimester 06/22/2020  . Elevated rheumatoid factor 02/13/2017  . Chronic bilateral low back pain without sciatica 10/25/2016  . Varicose vein of leg 04/08/2016  . Depression 04/08/2016  . Vitamin D insufficiency 03/03/2016  . Irregular menstrual cycle 03/02/2016  . Polycystic ovaries 07/24/2015  . Allergic rhinitis 12/08/2014  . PAC (premature atrial contraction) 06/27/2014  . Fatigue 06/27/2014  . Cervical radiculopathy at C6 06/27/2014   Indications for ASA therapy (per uptodate)  Two or more of the following: Nulliparity No Obesity (body mass index >30 kg/m2) Yes Family history of preeclampsia in mother or sister No Age ?35 years No  Sociodemographic characteristics (African American race, low socioeconomic level) Yes Personal risk factors (eg, previous pregnancy with low birth weight or small for gestational age infant, previous adverse pregnancy outcome [eg, stillbirth], interval >10 years between pregnancies) No   Plan:    1. Supervision of low-risk pregnancy, second trimester - LOB, intro to practice, typical arrangement of visits - Cervicovaginal ancillary only( [place]) - Cytology - PAP( [place]) - CBC/D/Plt+RPR+Rh+ABO+Rub Ab... - Culture, OB Urine - [place] MFM OB COMP + 14 WK;  Future - Comp Met (CMET) - Protein / creatinine ratio, urine - Genetic Screening - Hemoglobin A1c - aspirin EC 81 MG tablet; Take 1 tablet (81 mg total) by mouth daily. Take after 12 weeks for prevention of preeclampsia later in pregnancy  Dispense: 300 tablet; Refill: 2 - Flu Vaccine QUAD 36+ mos IM  2. [redacted] weeks gestation of pregnancy  3. Language barrier affecting health care - Interpreter utilized for all patient interaction  4. Vomiting affecting pregnancy - Defer prenatal vitamin if unable to tolerate PO for 12 hours - Consider evaluation in MAU for recurrent vomiting, assess for IV fluids - promethazine (PHENERGAN) 12.5 MG tablet; Take 1 tablet (12.5 mg total) by mouth every 6 (six) hours as needed for nausea or vomiting.  Dispense: 30 tablet; Refill: 0   Initial labs drawn. Continue prenatal vitamins. Problem list reviewed and updated. Genetic Screening discussed, First trimester screen and NIPS: ordered. Ultrasound discussed; fetal anatomic survey: ordered. Anticipatory guidance about prenatal visits given including labs, ultrasounds, and testing. Discussed usage of Babyscripts and virtual visits as additional source of managing and completing prenatal visits in midst of coronavirus and pandemic.   Encouraged to complete MyChart Registration for her ability to review results, send requests, and have questions addressed.  The nature of [place] for [facility] with multiple MDs and Advanced Practice Providers was explained to patient; also emphasized that residents, students are part of our team. Routine obstetric precautions reviewed. Encouraged to seek out care at office or emergency room [facility] preferred) for urgent and/or emergent concerns. Return in about 4 weeks (around 07/20/2020).     Mallie Snooks, MSN, CNM Certified Nurse Midwife, [facility] for [employer], [place] 06/22/20 7:49 PM

## 2020-06-23 LAB — CBC/D/PLT+RPR+RH+ABO+RUB AB...
Antibody Screen: NEGATIVE
Basophils Absolute: 0.1 10*3/uL (ref 0.0–0.2)
Basos: 1 %
EOS (ABSOLUTE): 0.1 10*3/uL (ref 0.0–0.4)
Eos: 1 %
HCV Ab: <0.1 s/co ratio (ref 0.0–0.9)
HIV Screen 4th Generation wRfx: NONREACTIVE
Hematocrit: 42.9 % (ref 34.0–46.6)
Hemoglobin: 14 g/dL (ref 11.1–15.9)
Hepatitis B Surface Ag: NEGATIVE
Immature Grans (Abs): 0.1 10*3/uL (ref 0.0–0.1)
Immature Granulocytes: 1 %
Lymphocytes Absolute: 2.6 10*3/uL (ref 0.7–3.1)
Lymphs: 17 %
MCH: 27.4 pg (ref 26.6–33.0)
MCHC: 32.6 g/dL (ref 31.5–35.7)
MCV: 84 fL (ref 79–97)
Monocytes Absolute: 1.1 10*3/uL — ABNORMAL HIGH (ref 0.1–0.9)
Monocytes: 8 %
Neutrophils Absolute: 10.9 10*3/uL — ABNORMAL HIGH (ref 1.4–7.0)
Neutrophils: 72 %
Platelets: 345 10*3/uL (ref 150–450)
RBC: 5.11 x10E6/uL (ref 3.77–5.28)
RDW: 12.3 % (ref 11.7–15.4)
RPR Ser Ql: NONREACTIVE
Rh Factor: POSITIVE
Rubella Antibodies, IGG: 23.6 index (ref >0.99)
WBC: 15 10*3/uL — ABNORMAL HIGH (ref 3.4–10.8)

## 2020-06-23 LAB — CERVICOVAGINAL ANCILLARY ONLY
Bacterial Vaginitis (gardnerella): NEGATIVE
Candida Glabrata: NEGATIVE
Candida Vaginitis: NEGATIVE
Chlamydia: NEGATIVE
Comment: NEGATIVE
Comment: NEGATIVE
Comment: NEGATIVE
Comment: NEGATIVE
Comment: NEGATIVE
Comment: NORMAL
Neisseria Gonorrhea: NEGATIVE
Trichomonas: NEGATIVE

## 2020-06-23 LAB — COMPREHENSIVE METABOLIC PANEL
ALT: 16 IU/L (ref 0–32)
AST: 22 IU/L (ref 0–40)
Albumin/Globulin Ratio: 1.3 (ref 1.2–2.2)
Albumin: 4.3 g/dL (ref 3.9–5.0)
Alkaline Phosphatase: 60 IU/L (ref 44–121)
BUN/Creatinine Ratio: 9 (ref 9–23)
BUN: 5 mg/dL — ABNORMAL LOW (ref 6–20)
Bilirubin Total: 0.2 mg/dL (ref 0.0–1.2)
CO2: 22 mmol/L (ref 20–29)
Calcium: 9.7 mg/dL (ref 8.7–10.2)
Chloride: 102 mmol/L (ref 96–106)
Creatinine, Ser: 0.53 mg/dL — ABNORMAL LOW (ref 0.57–1.00)
GFR calc Af Amer: 150 mL/min/{1.73_m2} (ref >59)
GFR calc non Af Amer: 131 mL/min/{1.73_m2} (ref >59)
Globulin, Total: 3.3 g/dL (ref 1.5–4.5)
Glucose: 71 mg/dL (ref 65–99)
Potassium: 4 mmol/L (ref 3.5–5.2)
Sodium: 138 mmol/L (ref 134–144)
Total Protein: 7.6 g/dL (ref 6.0–8.5)

## 2020-06-23 LAB — PROTEIN / CREATININE RATIO, URINE
Creatinine, Urine: 78.6 mg/dL
Protein, Ur: 4.1 mg/dL
Protein/Creat Ratio: 52 mg/g creat (ref 0–200)

## 2020-06-23 LAB — HEMOGLOBIN A1C
Est. average glucose Bld gHb Est-mCnc: 114 mg/dL
Hgb A1c MFr Bld: 5.6 % (ref 4.8–5.6)

## 2020-06-23 LAB — HCV INTERPRETATION

## 2020-06-24 LAB — CULTURE, OB URINE

## 2020-06-24 LAB — CYTOLOGY - PAP: Diagnosis: NEGATIVE

## 2020-06-24 LAB — URINE CULTURE, OB REFLEX

## 2020-06-29 ENCOUNTER — Encounter: Payer: Self-pay | Admitting: *Deleted

## 2020-07-09 ENCOUNTER — Encounter: Payer: Self-pay | Admitting: *Deleted

## 2020-07-22 ENCOUNTER — Other Ambulatory Visit: Payer: Self-pay

## 2020-07-22 ENCOUNTER — Ambulatory Visit (INDEPENDENT_AMBULATORY_CARE_PROVIDER_SITE_OTHER): Payer: Medicaid Other | Admitting: Obstetrics & Gynecology

## 2020-07-22 VITALS — BP 115/74 | HR 91 | Wt 177.0 lb

## 2020-07-22 DIAGNOSIS — Z3492 Encounter for supervision of normal pregnancy, unspecified, second trimester: Secondary | ICD-10-CM | POA: Diagnosis not present

## 2020-07-22 NOTE — Patient Instructions (Signed)

## 2020-07-22 NOTE — Progress Notes (Signed)
° °  PRENATAL VISIT NOTE  Subjective:  Megan [name] is a 29 y.o. G3P2 at [redacted]w[redacted]d being seen today for ongoing prenatal care.  She is currently monitored for the following issues for this high-risk pregnancy and has PAC (premature atrial contraction); Fatigue; Cervical radiculopathy at C6; Allergic rhinitis; Polycystic ovaries; Irregular menstrual cycle; Vitamin D insufficiency; Varicose vein of leg; Depression; Chronic bilateral low back pain without sciatica; Elevated rheumatoid factor; and Supervision of low-risk pregnancy, second trimester on their problem list.  Patient reports no complaints.  Contractions: Not present. Vag. Bleeding: None.  Movement: Absent. Denies leaking of fluid.   The following portions of the patient's history were reviewed and updated as appropriate: allergies, current medications, past family history, past medical history, past social history, past surgical history and problem list.   Objective:   Vitals:   07/22/20 0934  BP: 115/74  Pulse: 91  Weight: 177 lb (80.3 kg)    Fetal Status: Fetal Heart Rate (bpm): 140   Movement: Absent     General:  Alert, oriented and cooperative. Patient is in no acute distress.  Skin: Skin is warm and dry. No rash noted.   Cardiovascular: Normal heart rate noted  Respiratory: Normal respiratory effort, no problems with respiration noted  Abdomen: Soft, gravid, appropriate for gestational age.  Pain/Pressure: Absent     Pelvic: Cervical exam deferred        Extremities: Normal range of motion.  Edema: None  Mental Status: Normal mood and affect. Normal behavior. Normal judgment and thought content.   Assessment and Plan:  Pregnancy: G3P2 at [redacted]w[redacted]d 1. Supervision of low-risk pregnancy, second trimester Needs anatomy scan  Preterm labor symptoms and general obstetric precautions including but not limited to vaginal bleeding, contractions, leaking of fluid and fetal movement were reviewed in detail with the patient. Please  refer to After Visit Summary for other counseling recommendations.   Return in about 4 weeks (around 08/19/2020).  No future appointments.  Scheryl Darter, MD

## 2020-07-25 NOTE — L&D Delivery Note (Signed)
OB/GYN Faculty Practice Delivery Note  Megan [name] is a 30 y.o. G3P3003 s/p VAVD at [redacted]w[redacted]d. She was admitted for spontaneous labor..   ROM: 5h 27m with clear fluid GBS Status:  Negative/-- (05/12 1451) Maximum Maternal Temperature: 98  Labor Progress: . Initial SVE: 5cm. She then progressed to complete.   Delivery Date/Time: 2951 on 5/22 Delivery: Called to room and patient was complete and pushing. Fetal heart rate noted to be in 50's on arrival to room, fetal head noted to be +3 and patient with good pain control with epidural in place. Dr. Alysia Penna notified and came to bedside and patient verbally consented for vacuum and kiwi vacuum applied. Delivered head with one pull of vacuum over one contraction. Loose nuchal cord present. At this time had difficulty delivering shoulder and called shoulder dystocia. Placed in [place]. Attempted removal of posterior arm without success. Successful delivery with woodscrew and then suprapubic pressure. Cord clamped without delay due to baby having poor tone, handed over to NICU team who were present.  Cord gas obtained but not resulted at this time. Cord blood drawn. Placenta delivered spontaneously with gentle cord traction. Fundus firm with massage and Pitocin. Labia, perineum, vagina, and cervix inspected inspected with no lacerations.  Baby Weight: pending  Placenta: Sent to L&D Complications: 60 second shoulder dystocia  Lacerations: none EBL: 200 mL Analgesia: Epidural   Infant:  APGAR (1 MIN): 5   APGAR (5 MINS): 8   APGAR (10 MINS):     Megan Harrison, MD [facility] Family Medicine Fellow, [facility] for [facility], [facility] 12/13/2020, 10:27 AM

## 2020-07-27 ENCOUNTER — Encounter: Payer: Self-pay | Admitting: *Deleted

## 2020-07-29 ENCOUNTER — Ambulatory Visit: Payer: Medicaid Other | Attending: Obstetrics and Gynecology

## 2020-07-29 ENCOUNTER — Other Ambulatory Visit: Payer: Self-pay | Admitting: Advanced Practice Midwife

## 2020-07-29 ENCOUNTER — Encounter: Payer: Self-pay | Admitting: Advanced Practice Midwife

## 2020-07-29 ENCOUNTER — Other Ambulatory Visit: Payer: Self-pay | Admitting: *Deleted

## 2020-07-29 ENCOUNTER — Other Ambulatory Visit: Payer: Self-pay

## 2020-07-29 DIAGNOSIS — Z3492 Encounter for supervision of normal pregnancy, unspecified, second trimester: Secondary | ICD-10-CM

## 2020-07-29 DIAGNOSIS — Z789 Other specified health status: Secondary | ICD-10-CM | POA: Insufficient documentation

## 2020-07-29 DIAGNOSIS — O4440 Low lying placenta NOS or without hemorrhage, unspecified trimester: Secondary | ICD-10-CM | POA: Insufficient documentation

## 2020-08-01 ENCOUNTER — Inpatient Hospital Stay (HOSPITAL_COMMUNITY)
Admission: EM | Admit: 2020-08-01 | Discharge: 2020-08-02 | Disposition: A | Discharge status: Home / Self Care | Payer: Medicaid Other | Attending: Emergency Medicine | Admitting: Emergency Medicine

## 2020-08-01 ENCOUNTER — Other Ambulatory Visit: Payer: Self-pay

## 2020-08-01 ENCOUNTER — Encounter (HOSPITAL_COMMUNITY): Payer: Self-pay

## 2020-08-01 DIAGNOSIS — O98512 Other viral diseases complicating pregnancy, second trimester: Secondary | ICD-10-CM | POA: Insufficient documentation

## 2020-08-01 DIAGNOSIS — U071 COVID-19: Secondary | ICD-10-CM

## 2020-08-01 DIAGNOSIS — Z3A19 19 weeks gestation of pregnancy: Secondary | ICD-10-CM | POA: Insufficient documentation

## 2020-08-01 DIAGNOSIS — Z7982 Long term (current) use of aspirin: Secondary | ICD-10-CM | POA: Insufficient documentation

## 2020-08-01 DIAGNOSIS — O26892 Other specified pregnancy related conditions, second trimester: Secondary | ICD-10-CM | POA: Diagnosis not present

## 2020-08-01 DIAGNOSIS — R109 Unspecified abdominal pain: Secondary | ICD-10-CM | POA: Diagnosis not present

## 2020-08-01 LAB — RESP PANEL BY RT-PCR (FLU A&B, COVID) ARPGX2
Influenza A by PCR: NEGATIVE
Influenza B by PCR: NEGATIVE
SARS Coronavirus 2 by RT PCR: POSITIVE — AB

## 2020-08-01 NOTE — ED Triage Notes (Signed)
Burmese Interpreter # [phone] Pt presents with generalized body aches, cough, sinus pressure and headache x2 days.

## 2020-08-02 ENCOUNTER — Encounter (HOSPITAL_COMMUNITY): Payer: Self-pay | Admitting: Obstetrics and Gynecology

## 2020-08-02 ENCOUNTER — Other Ambulatory Visit: Payer: Self-pay

## 2020-08-02 DIAGNOSIS — U071 COVID-19: Secondary | ICD-10-CM

## 2020-08-02 DIAGNOSIS — R109 Unspecified abdominal pain: Secondary | ICD-10-CM

## 2020-08-02 DIAGNOSIS — O26892 Other specified pregnancy related conditions, second trimester: Secondary | ICD-10-CM

## 2020-08-02 DIAGNOSIS — O98512 Other viral diseases complicating pregnancy, second trimester: Secondary | ICD-10-CM

## 2020-08-02 DIAGNOSIS — Z3A19 19 weeks gestation of pregnancy: Secondary | ICD-10-CM

## 2020-08-02 LAB — URINALYSIS, ROUTINE W REFLEX MICROSCOPIC
Bilirubin Urine: NEGATIVE
Glucose, UA: NEGATIVE mg/dL
Hgb urine dipstick: NEGATIVE
Ketones, ur: NEGATIVE mg/dL
Nitrite: NEGATIVE
Protein, ur: NEGATIVE mg/dL
Specific Gravity, Urine: 1.013 (ref 1.005–1.030)
pH: 6 (ref 5.0–8.0)

## 2020-08-02 LAB — WET PREP, GENITAL
Clue Cells Wet Prep HPF POC: NONE SEEN
Sperm: NONE SEEN
Trich, Wet Prep: NONE SEEN
Yeast Wet Prep HPF POC: NONE SEEN

## 2020-08-02 MED ORDER — FLUTICASONE PROPIONATE 50 MCG/ACT NA SUSP: 2.0000 | Freq: Every day | NASAL | 0 refills | Status: DC

## 2020-08-02 MED ORDER — ACETAMINOPHEN 325 MG PO TABS
650.0000 mg | ORAL_TABLET | Freq: Once | ORAL | Status: AC
  Administered 2020-08-02: 650 mg via ORAL
  Filled 2020-08-02: qty 2

## 2020-08-02 MED ORDER — BENZONATATE 100 MG PO CAPS: 200.0000 mg | ORAL_CAPSULE | Freq: Three times a day (TID) | ORAL | 0 refills | Status: DC | PRN

## 2020-08-02 NOTE — Discharge Instructions (Signed)
Safe Medications in Pregnancy   Acne: Benzoyl Peroxide Salicylic Acid  Backache/Headache: Tylenol: 2 regular strength every 4 hours OR              2 Extra strength every 6 hours  Colds/Coughs/Allergies: Benadryl (alcohol free) 25 mg every 6 hours as needed Breath right strips Claritin Cepacol throat lozenges Chloraseptic throat spray Cold-Eeze- up to three times per day Cough drops, alcohol free Flonase (by prescription only) Guaifenesin Mucinex Robitussin DM (plain only, alcohol free) Saline nasal spray/drops Sudafed (pseudoephedrine) & Actifed ** use only after [redacted] weeks gestation and if you do not have high blood pressure Tylenol Vicks Vaporub Zinc lozenges Zyrtec   Constipation: Colace Ducolax suppositories Fleet enema Glycerin suppositories Metamucil Milk of magnesia Miralax Senokot Smooth move tea  Diarrhea: Kaopectate Imodium A-D  *NO pepto Bismol  Hemorrhoids: Anusol Anusol HC Preparation H Tucks  Indigestion: Tums Maalox Mylanta Zantac  Pepcid  Insomnia: Benadryl (alcohol free) 25mg every 6 hours as needed Tylenol PM Unisom, no Gelcaps  Leg Cramps: Tums MagGel  Nausea/Vomiting:  Bonine Dramamine Emetrol Ginger extract Sea bands Meclizine  Nausea medication to take during pregnancy:  Unisom (doxylamine succinate 25 mg tablets) Take one tablet daily at bedtime. If symptoms are not adequately controlled, the dose can be increased to a maximum recommended dose of two tablets daily (1/2 tablet in the morning, 1/2 tablet mid-afternoon and one at bedtime). Vitamin B6 100mg tablets. Take one tablet twice a day (up to 200 mg per day).  Skin Rashes: Aveeno products Benadryl cream or 25mg every 6 hours as needed Calamine Lotion 1% cortisone cream  Yeast infection: Gyne-lotrimin 7 Monistat 7   **If taking multiple medications, please check labels to avoid duplicating the same active ingredients **take medication as directed on  the label ** Do not exceed 4000 mg of tylenol in 24 hours **Do not take medications that contain aspirin or ibuprofen     10 Things You Can Do to Manage Your COVID-19 Symptoms at Home If you have possible or confirmed COVID-19: 1. Stay home from work and school. And stay away from other public places. If you must go out, avoid using any kind of public transportation, ridesharing, or taxis. 2. Monitor your symptoms carefully. If your symptoms get worse, call your healthcare provider immediately. 3. Get rest and stay hydrated. 4. If you have a medical appointment, call the healthcare provider ahead of time and tell them that you have or may have COVID-19. 5. For medical emergencies, call 911 and notify the dispatch personnel that you have or may have COVID-19. 6. Cover your cough and sneezes with a tissue or use the inside of your elbow. 7. Wash your hands often with soap and water for at least 20 seconds or clean your hands with an alcohol-based hand sanitizer that contains at least 60% alcohol. 8. As much as possible, stay in a specific room and away from other people in your home. Also, you should use a separate bathroom, if available. If you need to be around other people in or outside of the home, wear a mask. 9. Avoid sharing personal items with other people in your household, like dishes, towels, and bedding. 10. Clean all surfaces that are touched often, like counters, tabletops, and doorknobs. Use household cleaning sprays or wipes according to the label instructions. cdc.gov/coronavirus 01/23/2019 This information is not intended to replace advice given to you by your health care provider. Make sure you discuss any questions you have with your   health care provider. Document Revised: 06/27/2019 Document Reviewed: 06/27/2019 Elsevier Patient Education  2020 Elsevier Inc.  

## 2020-08-02 NOTE — MAU Provider Note (Signed)
History     CSN: [id number]  Arrival date and time: 08/01/20 1859   Event Date/Time   First Provider Initiated Contact with Patient 08/02/20 [phone]      Chief Complaint  Patient presents with  . Cough   HPI Megan [name] is a 30 y.o. G3P2 at [redacted]w[redacted]d who presents from [facility] for evaluation of abdominal pain. She presented to [facility] due to COVID-19 symptoms and tested positive. She also reported abdominal pain so she was transferred to MAU for further evaluation. She states the pain comes and goes and is worse when she coughs. She denies any bleeding or leaking. Reports normal fetal movement.   OB History    Gravida  3   Para  2   Term      Preterm      AB      Living  2     SAB      IAB      Ectopic      Multiple      Live Births              Past Medical History:  Diagnosis Date  . Polycystic ovaries 07/24/2015    History reviewed. No pertinent surgical history.  Family History  Problem Relation Age of Onset  . Diabetes Mother   . Hypertension Mother     Social History   Tobacco Use  . Smoking status: Never Smoker  . Smokeless tobacco: Never Used  Substance Use Topics  . Alcohol use: No  . Drug use: No    Allergies: No Known Allergies  Medications Prior to Admission  Medication Sig Dispense Refill Last Dose  . aspirin EC 81 MG tablet Take 1 tablet (81 mg total) by mouth daily. Take after 12 weeks for prevention of preeclampsia later in pregnancy 300 tablet 2 Past Week at Unknown time  . Prenatal Vit-Fe Fumarate-FA (MULTIVITAMIN-PRENATAL) 27-0.8 MG TABS tablet Take 1 tablet by mouth daily at 12 noon. 30 tablet 1 Past Week at Unknown time  . promethazine (PHENERGAN) 12.5 MG tablet Take 1 tablet (12.5 mg total) by mouth every 6 (six) hours as needed for nausea or vomiting. 30 tablet 0 Past Week at Unknown time    Review of Systems  Constitutional: Negative.  Negative for fatigue and fever.  HENT: Positive for congestion and sore throat.    Respiratory: Positive for cough. Negative for shortness of breath.   Cardiovascular: Negative.  Negative for chest pain.  Gastrointestinal: Positive for abdominal pain. Negative for constipation, diarrhea, nausea and vomiting.  Genitourinary: Negative.  Negative for dysuria, vaginal bleeding and vaginal discharge.  Neurological: Negative.  Negative for dizziness and headaches.   Physical Exam   Blood pressure 110/83, pulse 88, temperature 98.4 F (36.9 C), temperature source Oral, resp. rate 17, height 5\' 3"  (1.6 m), last menstrual period 03/06/2020, SpO2 97 %.  Physical Exam Vitals and nursing note reviewed.  Constitutional:      General: She is not in acute distress.    Appearance: She is well-developed and well-nourished.  HENT:     Head: Normocephalic.  Eyes:     Pupils: Pupils are equal, round, and reactive to light.  Cardiovascular:     Rate and Rhythm: Normal rate and regular rhythm.     Heart sounds: Normal heart sounds.  Pulmonary:     Effort: Pulmonary effort is normal. No respiratory distress.     Breath sounds: Normal breath sounds.  Abdominal:     General: Bowel  sounds are normal. There is no distension.     Palpations: Abdomen is soft.     Tenderness: There is no abdominal tenderness.  Skin:    General: Skin is warm and dry.  Neurological:     Mental Status: She is alert and oriented to person, place, and time.  Psychiatric:        Mood and Affect: Mood and affect normal.        Behavior: Behavior normal.        Thought Content: Thought content normal.        Judgment: Judgment normal.     MAU Course  Procedures Results for orders placed or performed during the hospital encounter of 08/01/20 (from the past 24 hour(s))  Resp Panel by RT-PCR (Flu A&B, Covid) Nasopharyngeal Swab     Status: Abnormal   Collection Time: 08/01/20  7:24 PM   Specimen: Nasopharyngeal Swab; Nasopharyngeal(NP) swabs in vial transport medium  Result Value Ref Range   SARS  Coronavirus 2 by RT PCR POSITIVE (A) NEGATIVE   Influenza A by PCR NEGATIVE NEGATIVE   Influenza B by PCR NEGATIVE NEGATIVE  Urinalysis, Routine w reflex microscopic     Status: Abnormal   Collection Time: 08/02/20  8:17 AM  Result Value Ref Range   Color, Urine YELLOW YELLOW   APPearance HAZY (A) CLEAR   Specific Gravity, Urine 1.013 1.005 - 1.030   pH 6.0 5.0 - 8.0   Glucose, UA NEGATIVE NEGATIVE mg/dL   Hgb urine dipstick NEGATIVE NEGATIVE   Bilirubin Urine NEGATIVE NEGATIVE   Ketones, ur NEGATIVE NEGATIVE mg/dL   Protein, ur NEGATIVE NEGATIVE mg/dL   Nitrite NEGATIVE NEGATIVE   Leukocytes,Ua TRACE (A) NEGATIVE   RBC / HPF 0-5 0 - 5 RBC/hpf   WBC, UA 0-5 0 - 5 WBC/hpf   Bacteria, UA RARE (A) NONE SEEN   Squamous Epithelial / LPF 6-10 0 - 5   Mucus PRESENT   Wet prep, genital     Status: Abnormal   Collection Time: 08/02/20  8:25 AM  Result Value Ref Range   Yeast Wet Prep HPF POC NONE SEEN NONE SEEN   Trich, Wet Prep NONE SEEN NONE SEEN   Clue Cells Wet Prep HPF POC NONE SEEN NONE SEEN   WBC, Wet Prep HPF POC MANY (A) NONE SEEN   Sperm NONE SEEN    MDM UA Cervix closed/thick/posterior Wet prep and gc/chlamydia   Reviewed results of positive COVID test with patient. Discussed typical course of virus and what to expect. Informed patient that symptoms tend to worsen on days 5-8 and reviewed safe medications for symptom management. Warning signs of when to return to MAU reviewed at length including shortness of breath, chest pain and decreased fetal movement. Instructed patient to quarantine for 10 days and any office appointments will be changed to virtual or rescheduled to outside of the quarantine window.  Recommended monoclonal antibody infusion for patient due to new diagnosis of COVID with symptom onset of less than 7 days and patient is currently unvaccinated for COVID. Discussed with patient that MAB are shown to decrease length and severity of the disease and can improve  overall outcomes. Reviewed limited evidence for use in pregnancy as well as evidence regarding outcomes for COVID if untreated in pregnancy. Patient refused MAB.   Assessment and Plan   1. COVID-19   2. Abdominal pain during pregnancy in second trimester   3. [redacted] weeks gestation of pregnancy    -Discharge  home in stable condition -Rx for flonase sent to patient's pharmacy -COVID precautions discussed -Patient advised to follow-up with OB as scheduled for prenatal care -Patient may return to MAU as needed or if her condition were to change or worsen   Rolm Bookbinder CNM 08/02/2020, 7:39 AM

## 2020-08-02 NOTE — ED Provider Notes (Signed)
[facility] EMERGENCY DEPARTMENT Provider Note   CSN: [id number] Arrival date & time: 08/01/20  1859     History Chief Complaint  Patient presents with  . Cough    Megan [name] is a 30 y.o. female without significant past medical hx who presents to the ED with complaints of cough x 2 days. Patient reports fever, chills, nasal congestion, sinus pressure, headaches, body aches, dry cough and chest pain w/ coughing otherwise no chest pain. Also feels out of breath at times. No other alleviating/aggravating factors. Denies leg pain/swelling, hemoptysis, recent surgery/trauma, recent long travel, personal hx of cancer, or hx of DVT/PE.  Patient states she is currently around [redacted] weeks pregnant, she is due 12/23/20. She does admit to some lower abdominal pain for the past 3-4 days, denies vaginal bleeding, dysuria, or vaginal discharge, denies complications with pregnancy thus far.   Burmese interpretor utilized throughout Audiological scientist.   HPI     Past Medical History:  Diagnosis Date  . Polycystic ovaries 07/24/2015    Patient Active Problem List   Diagnosis Date Noted  . Low-lying placenta 07/29/2020  . Language barrier affecting health care 07/29/2020  . Supervision of low-risk pregnancy, second trimester 06/22/2020  . Elevated rheumatoid factor 02/13/2017  . Chronic bilateral low back pain without sciatica 10/25/2016  . Varicose vein of leg 04/08/2016  . Depression 04/08/2016  . Vitamin D insufficiency 03/03/2016  . Irregular menstrual cycle 03/02/2016  . Polycystic ovaries 07/24/2015  . Allergic rhinitis 12/08/2014  . PAC (premature atrial contraction) 06/27/2014  . Fatigue 06/27/2014  . Cervical radiculopathy at C6 06/27/2014    No past surgical history on file.   OB History    Gravida  3   Para  2   Term      Preterm      AB      Living  2     SAB      IAB      Ectopic      Multiple      Live Births              Family History   Problem Relation Age of Onset  . Diabetes Mother   . Hypertension Mother     Social History   Tobacco Use  . Smoking status: Never Smoker  . Smokeless tobacco: Never Used  Substance Use Topics  . Alcohol use: No  . Drug use: No    Home Medications Prior to Admission medications   Medication Sig Start Date End Date Taking? Authorizing Provider  aspirin EC 81 MG tablet Take 1 tablet (81 mg total) by mouth daily. Take after 12 weeks for prevention of preeclampsia later in pregnancy 06/22/20   Calvert Cantor, CNM  Prenatal Vit-Fe Fumarate-FA (MULTIVITAMIN-PRENATAL) 27-0.8 MG TABS tablet Take 1 tablet by mouth daily at 12 noon. 06/02/20   Hoy Register, MD  promethazine (PHENERGAN) 12.5 MG tablet Take 1 tablet (12.5 mg total) by mouth every 6 (six) hours as needed for nausea or vomiting. 06/22/20   Calvert Cantor, CNM    Allergies    Patient has no known allergies.  Review of Systems   Review of Systems  Constitutional: Positive for chills and fever.  HENT: Positive for congestion and sore throat.   Eyes: Negative for visual disturbance.  Respiratory: Positive for cough and shortness of breath.   Cardiovascular: Positive for chest pain ( w/ coughing). Negative for leg swelling.  Gastrointestinal: Negative for abdominal pain  and vomiting.  Genitourinary: Negative for dysuria, vaginal bleeding and vaginal discharge.  Neurological: Positive for headaches. Negative for syncope, weakness (focal) and numbness.  All other systems reviewed and are negative.   Physical Exam Updated Vital Signs BP 122/81 (BP Location: Right Arm)   Pulse 96   Temp 99 F (37.2 C) (Oral)   Resp 18   LMP 03/06/2020 (Exact Date)   SpO2 98%   Physical Exam Vitals and nursing note reviewed.  Constitutional:      General: She is not in acute distress.    Appearance: She is well-developed.  HENT:     Head: Normocephalic and atraumatic.     Right Ear: Ear canal normal. Tympanic membrane  is not perforated, erythematous, retracted or bulging.     Left Ear: Ear canal normal. Tympanic membrane is not perforated, erythematous, retracted or bulging.     Ears:     Comments: No mastoid erythema/swelling/tenderness.     Nose:     Right Sinus: No maxillary sinus tenderness or frontal sinus tenderness.     Left Sinus: No maxillary sinus tenderness or frontal sinus tenderness.     Mouth/Throat:     Pharynx: Uvula midline. No oropharyngeal exudate or posterior oropharyngeal erythema.     Comments: Posterior oropharynx is symmetric appearing. Patient tolerating own secretions without difficulty. No trismus. No drooling. No hot potato voice. No swelling beneath the tongue, submandibular compartment is soft.  Eyes:     General:        Right eye: No discharge.        Left eye: No discharge.     Conjunctiva/sclera: Conjunctivae normal.     Pupils: Pupils are equal, round, and reactive to light.  Cardiovascular:     Rate and Rhythm: Normal rate and regular rhythm.     Heart sounds: No murmur heard.   Pulmonary:     Effort: Pulmonary effort is normal. No respiratory distress.     Breath sounds: Normal breath sounds. No wheezing, rhonchi or rales.  Chest:     Chest wall: Tenderness (anterior chest wall which reproduces her chest discomfort) present.  Abdominal:     General: There is no distension.     Palpations: Abdomen is soft.     Tenderness: There is abdominal tenderness (Right lower quadrant/suprapubic area).  Musculoskeletal:     Cervical back: Normal range of motion and neck supple. No edema or rigidity.  Lymphadenopathy:     Cervical: No cervical adenopathy.  Skin:    General: Skin is warm and dry.     Findings: No rash.  Neurological:     Mental Status: She is alert.  Psychiatric:        Behavior: Behavior normal.     ED Results / Procedures / Treatments   Labs (all labs ordered are listed, but only abnormal results are displayed) Labs Reviewed  RESP PANEL BY  RT-PCR (FLU A&B, COVID) ARPGX2 - Abnormal; Notable for the following components:      Result Value   SARS Coronavirus 2 by RT PCR POSITIVE (*)    All other components within normal limits    EKG None  Radiology No results found.  Procedures Procedures (including critical care time)  Medications Ordered in ED Medications - No data to display  ED Course  I have reviewed the triage vital signs and the nursing notes.  Pertinent labs & imaging results that were available during my care of the patient were reviewed by me and considered  in my medical decision making (see chart for details).    [id number] Crow was evaluated in Emergency Department on 08/02/2020 for the symptoms described in the history of present illness. He/she was evaluated in the context of the global COVID-19 pandemic, which necessitated consideration that the patient might be at risk for infection with the SARS-CoV-2 virus that causes COVID-19. Institutional protocols and algorithms that pertain to the evaluation of patients at risk for COVID-19 are in a state of rapid change based on information released by regulatory bodies including the CDC and federal and state organizations. These policies and algorithms were followed during the patient's care in the ED.  MDM Rules/Calculators/A&P                         Patient presents to the ED with complaints of cough and several other sxs for the past 2 days.  Nontoxic, vitals WNL.   Additional history obtained:  Additional history obtained from chart review & nursing note review.  Lab Tests:  COVID testing- positive.   Suspect her sxs are primarily covid 19 related, she is overall well appearing, not in respiratory distress, and is not hypoxic. Her chest pain is reproducible with chest wall palpation & only occurs with coughing raising suspicion for MSK etiology, feel that PE is less likely at this time, low risk wells. She however is currently [redacted] weeks pregnant complaining  of abdominal pain, no vaginal bleeding, however given this concern will discuss w/ MAU for transfer.   06:15: CONSULT: Discussed with MAU APP Shanda Bumps- accepts patient in transfer.  Portions of this note were generated with Scientist, clinical (histocompatibility and immunogenetics). Dictation errors may occur despite best attempts at proofreading.  Final Clinical Impression(s) / ED Diagnoses Final diagnoses:  COVID-19  Abdominal pain during pregnancy in second trimester    Rx / DC Orders ED Discharge Orders    None       Cherly Anderson, PA-C 08/02/20 [id number]    Dione Booze, MD 08/02/20 [phone]

## 2020-08-02 NOTE — MAU Note (Signed)
.  Megan [name] is a 29 y.o. at [redacted]w[redacted]d here in MAU reporting: coughing sneezing and sore throat. This began yesterday.  Patient reports right lower abdominal pain that hurts when she presses on it and when she is sitting down. Denies vaginal bleeding.  Pain score: 5/10 Vitals:   08/02/20 0659 08/02/20 0703  BP:  110/83  Pulse:  88  Resp:  17  Temp:  98.4 F (36.9 C)  SpO2: 99% 97%

## 2020-08-03 LAB — GC/CHLAMYDIA PROBE AMP (~~LOC~~) NOT AT ARMC
Chlamydia: NEGATIVE
Comment: NEGATIVE
Comment: NORMAL
Neisseria Gonorrhea: NEGATIVE

## 2020-08-20 ENCOUNTER — Other Ambulatory Visit: Payer: Self-pay

## 2020-08-20 ENCOUNTER — Encounter (HOSPITAL_COMMUNITY): Payer: Self-pay | Admitting: Family Medicine

## 2020-08-20 ENCOUNTER — Ambulatory Visit (INDEPENDENT_AMBULATORY_CARE_PROVIDER_SITE_OTHER): Payer: Medicaid Other | Admitting: Obstetrics and Gynecology

## 2020-08-20 ENCOUNTER — Inpatient Hospital Stay (HOSPITAL_COMMUNITY)
Admission: AD | Admit: 2020-08-20 | Discharge: 2020-08-20 | Disposition: A | Discharge status: Home / Self Care | Payer: Medicaid Other | Attending: Family Medicine | Admitting: Family Medicine

## 2020-08-20 VITALS — BP 157/98 | HR 144 | Wt 182.0 lb

## 2020-08-20 DIAGNOSIS — O162 Unspecified maternal hypertension, second trimester: Secondary | ICD-10-CM

## 2020-08-20 DIAGNOSIS — O4440 Low lying placenta NOS or without hemorrhage, unspecified trimester: Secondary | ICD-10-CM

## 2020-08-20 DIAGNOSIS — Z3492 Encounter for supervision of normal pregnancy, unspecified, second trimester: Secondary | ICD-10-CM

## 2020-08-20 DIAGNOSIS — O26892 Other specified pregnancy related conditions, second trimester: Secondary | ICD-10-CM | POA: Diagnosis present

## 2020-08-20 DIAGNOSIS — Z3A22 22 weeks gestation of pregnancy: Secondary | ICD-10-CM | POA: Diagnosis not present

## 2020-08-20 DIAGNOSIS — I491 Atrial premature depolarization: Secondary | ICD-10-CM

## 2020-08-20 DIAGNOSIS — R030 Elevated blood-pressure reading, without diagnosis of hypertension: Secondary | ICD-10-CM | POA: Diagnosis not present

## 2020-08-20 DIAGNOSIS — R768 Other specified abnormal immunological findings in serum: Secondary | ICD-10-CM

## 2020-08-20 HISTORY — DX: Other specified health status: Z78.9

## 2020-08-20 LAB — CBC
HCT: 38.8 % (ref 36.0–46.0)
Hemoglobin: 12.8 g/dL (ref 12.0–15.0)
MCH: 28.5 pg (ref 26.0–34.0)
MCHC: 33 g/dL (ref 30.0–36.0)
MCV: 86.4 fL (ref 80.0–100.0)
Platelets: 248 10*3/uL (ref 150–400)
RBC: 4.49 MIL/uL (ref 3.87–5.11)
RDW: 13.7 % (ref 11.5–15.5)
WBC: 14.3 10*3/uL — ABNORMAL HIGH (ref 4.0–10.5)
nRBC: 0 % (ref 0.0–0.2)

## 2020-08-20 LAB — POCT URINALYSIS DIP (DEVICE)
Bilirubin Urine: NEGATIVE
Glucose, UA: NEGATIVE mg/dL
Hgb urine dipstick: NEGATIVE
Ketones, ur: NEGATIVE mg/dL
Leukocytes,Ua: NEGATIVE
Nitrite: NEGATIVE
Protein, ur: NEGATIVE mg/dL
Specific Gravity, Urine: >=1.03 (ref 1.005–1.030)
Urobilinogen, UA: 0.2 mg/dL (ref 0.0–1.0)
pH: 6.5 (ref 5.0–8.0)

## 2020-08-20 LAB — PROTEIN / CREATININE RATIO, URINE
Creatinine, Urine: 268.98 mg/dL
Protein Creatinine Ratio: 0.09 mg/mg{Cre} (ref 0.00–0.15)
Total Protein, Urine: 24 mg/dL

## 2020-08-20 LAB — COMPREHENSIVE METABOLIC PANEL
ALT: 11 U/L (ref 0–44)
AST: 21 U/L (ref 15–41)
Albumin: 3.2 g/dL — ABNORMAL LOW (ref 3.5–5.0)
Alkaline Phosphatase: 46 U/L (ref 38–126)
Anion gap: 12 (ref 5–15)
BUN: 6 mg/dL (ref 6–20)
CO2: 19 mmol/L — ABNORMAL LOW (ref 22–32)
Calcium: 9 mg/dL (ref 8.9–10.3)
Chloride: 106 mmol/L (ref 98–111)
Creatinine, Ser: 0.58 mg/dL (ref 0.44–1.00)
GFR, Estimated: >60 mL/min (ref >60)
Glucose, Bld: 136 mg/dL — ABNORMAL HIGH (ref 70–99)
Potassium: 3.6 mmol/L (ref 3.5–5.1)
Sodium: 137 mmol/L (ref 135–145)
Total Bilirubin: 0.3 mg/dL (ref 0.3–1.2)
Total Protein: 6.6 g/dL (ref 6.5–8.1)

## 2020-08-20 NOTE — Discharge Instructions (Signed)
Hypertension During Pregnancy High blood pressure (hypertension) is when the force of blood pumping through the arteries is high enough to cause problems with your health. Arteries are blood vessels that carry blood from the heart throughout the body. Hypertension during pregnancy can cause problems for you and your baby. It can be mild or severe. There are different types of hypertension that can happen during pregnancy. These include:  Chronic hypertension. This happens when you had high blood pressure before you became pregnant, and it continues during the pregnancy. Hypertension that develops before you are [redacted] weeks pregnant and continues during the pregnancy is also called chronic hypertension. If you have chronic hypertension, it will not go away after you have your baby. You will need follow-up visits with your health care provider after you have your baby. Your health care provider may want you to keep taking medicine for your blood pressure.  Gestational hypertension. This is hypertension that develops after the 20th week of pregnancy. Gestational hypertension usually goes away after you have your baby, but your health care provider will need to monitor your blood pressure to make sure that it is getting better.  Postpartum hypertension. This is high blood pressure that was present before delivery and continues after delivery or that starts after delivery. This usually occurs within 48 hours after childbirth but may occur up to 6 weeks after giving birth. When hypertension during pregnancy is severe, it is a medical emergency that requires treatment right away. How does this affect me? Women who have hypertension during pregnancy have a greater chance of developing hypertension later in life or during future pregnancies. In some cases, hypertension during pregnancy can cause serious complications, such as:  Stroke.  Heart attack.  Injury to other organs, such as kidneys, lungs, or  liver.  Preeclampsia.  A condition called hemolysis, elevated liver enzymes, and low platelet count (HELLP) syndrome.  Convulsions or seizures.  Placental abruption. How does this affect my baby? Hypertension during pregnancy can affect your baby. Your baby may:  Be born early (prematurely).  Not weigh as much as he or she should at birth (low birth weight).  Not tolerate labor well, leading to an unplanned cesarean delivery. This condition may also result in a baby's death before birth (stillbirth). What are the risks? There are certain factors that make it more likely for you to develop hypertension during pregnancy. These include:  Having hypertension during a previous pregnancy or a family history of hypertension.  Being overweight.  Being age 35 or older.  Being pregnant for the first time.  Being pregnant with more than one baby.  Becoming pregnant using fertilization methods, such as IVF (in vitro fertilization).  Having other medical problems, such as diabetes, kidney disease, or lupus. What can I do to lower my risk? The exact cause of hypertension during pregnancy is not known. You may be able to lower your risk by:  Maintaining a healthy weight.  Eating a healthy and balanced diet.  Following your health care provider's instructions about treating any long-term conditions that you had before becoming pregnant. It is very important to keep all of your prenatal care appointments. Your health care provider will check your blood pressure and make sure that your pregnancy is progressing as expected. If a problem is found, early treatment can prevent complications.   How is this treated? Treatment for hypertension during pregnancy varies depending on the type of hypertension you have and how serious it is.  If you were   taking medicine for high blood pressure before you became pregnant, talk with your health care provider. You may need to change medicine during  pregnancy because some medicines, like ACE inhibitors, may not be considered safe for your baby.  If you have gestational hypertension, your health care provider may order medicine to treat this during pregnancy.  If you are at risk for preeclampsia, your health care provider may recommend that you take a low-dose aspirin during your pregnancy.  If you have severe hypertension, you may need to be hospitalized so you and your baby can be monitored closely. You may also need to be given medicine to lower your blood pressure.  In some cases, if your condition gets worse, you may need to deliver your baby early. Follow these instructions at home: Eating and drinking  Drink enough fluid to keep your urine pale yellow.  Avoid caffeine.   Lifestyle  Do not use any products that contain nicotine or tobacco. These products include cigarettes, chewing tobacco, and vaping devices, such as e-cigarettes. If you need help quitting, ask your health care provider.  Do not use alcohol or drugs.  Avoid stress as much as possible.  Rest and get plenty of sleep.  Regular exercise can help to reduce your blood pressure. Ask your health care provider what kinds of exercise are best for you. General instructions  Take over-the-counter and prescription medicines only as told by your health care provider.  Keep all prenatal and follow-up visits. This is important. Contact a health care provider if:  You have symptoms that your health care provider told you may require more treatment or monitoring, such as: ? Headaches. ? Nausea or vomiting. ? Abdominal pain. ? Dizziness. ? Light-headedness. Get help right away if:  You have symptoms of serious complications, such as: ? Severe abdominal pain that does not get better with treatment. ? A severe headache that does not get better, blurred vision, or double vision. ? Vomiting that does not get better. ? Sudden, rapid weight gain or swelling in your  hands, ankles, or face. ? Vaginal bleeding. ? Blood in your urine. ? Shortness of breath or chest pain. ? Weakness on one side of your body or difficulty speaking.  Your baby is not moving as much as usual. These symptoms may represent a serious problem that is an emergency. Do not wait to see if the symptoms will go away. Get medical help right away. Call your local emergency services (911 in the [place] Do not drive yourself to the hospital. Summary  Hypertension during pregnancy can cause problems for you and your baby.  Treatment for hypertension during pregnancy varies depending on the type of hypertension you have and how serious it is.  Keep all prenatal and follow-up visits. This is important.  Get help right away if you have symptoms of serious complications related to high blood pressure. This information is not intended to replace advice given to you by your health care provider. Make sure you discuss any questions you have with your health care provider. Document Revised: 04/02/2020 Document Reviewed: 04/02/2020 Elsevier Patient Education  2021 Elsevier Inc.  

## 2020-08-20 NOTE — Progress Notes (Signed)
Video Interpreter # Zin [id number] Report provided to MAU staff, Felicia, in regards to pt's arrival for elevated BP's.  Addison Naegeli, RN  08/20/20

## 2020-08-20 NOTE — Progress Notes (Signed)
   PRENATAL VISIT NOTE  Subjective:  Megan [name] is a 30 y.o. G3P2 at [redacted]w[redacted]d being seen today for ongoing prenatal care.  She is currently monitored for the following issues for this low-risk pregnancy and has PAC (premature atrial contraction); Fatigue; Cervical radiculopathy at C6; Allergic rhinitis; Polycystic ovaries; Irregular menstrual cycle; Vitamin D insufficiency; Varicose vein of leg; Depression; Chronic bilateral low back pain without sciatica; Elevated rheumatoid factor; Supervision of low-risk pregnancy, second trimester; Low-lying placenta; Language barrier affecting health care; and [redacted] weeks gestation of pregnancy on their problem list.  Patient doing well with no acute concerns today. She reports headache.  Contractions: Not present. Vag. Bleeding: None.  Movement: Present. Denies leaking of fluid.   The following portions of the patient's history were reviewed and updated as appropriate: allergies, current medications, past family history, past medical history, past social history, past surgical history and problem list. Problem list updated.  Objective:   Vitals:   08/20/20 1015 08/20/20 1021  BP: (!) 175/118 (!) 157/98  Pulse: (!) 144   Weight: 182 lb (82.6 kg)     Fetal Status: Fetal Heart Rate (bpm): 152   Movement: Present     General:  Alert, oriented and cooperative. Patient is in no acute distress.  Skin: Skin is warm and dry. No rash noted.   Cardiovascular: Normal heart rate noted  Respiratory: Normal respiratory effort, no problems with respiration noted  Abdomen: Soft, gravid, appropriate for gestational age.  Pain/Pressure: Absent     Pelvic: Cervical exam deferred        Extremities: Normal range of motion.  Edema: None  Mental Status:  Normal mood and affect. Normal behavior. Normal judgment and thought content.   Assessment and Plan:  Pregnancy: G3P2 at [redacted]w[redacted]d  1. PAC (premature atrial contraction)   2. Low-lying placenta Pt has repeat scan on  08/27/20  3. Supervision of low-risk pregnancy, second trimester   4. Elevated rheumatoid factor   5. [redacted] weeks gestation of pregnancy   Preterm labor symptoms and general obstetric precautions including but not limited to vaginal bleeding, contractions, leaking of fluid and fetal movement were reviewed in detail with the patient. Pt is being sent to MAU for BP control and labwork.  Likely a variant of gestational hypertension, unsure if early onset preeclampsia, severe.  If pressures remain high would start on procardia XL 30 mg, pt has no pharmacy listed.  Please refer to After Visit Summary for other counseling recommendations.   Return in about 3 weeks (around 09/10/2020) for ROB, in person.   Mariel Aloe, MD

## 2020-08-20 NOTE — MAU Provider Note (Signed)
OB/GYN Attending MAU Note First Provider Initiated Contact with Patient 08/20/20 1244    S [name] is a 30 y.o. G3P2002 at [redacted]w[redacted]d who presents to MAU today after having elevated BP today in offices.  BP in office was  175/118, repeat 157/98.  She was sent her for further laboratory and other evaluation.On arrival here, patient denies any headaches, visual symptoms, RUQ/epigastric pain or other concerning symptoms.  No history of hypertensive disorder. Of note, encounter done with the help of a virtual [employer] [place]ese interpreter.    Patient Active Problem List   Diagnosis Date Noted  . [redacted] weeks gestation of pregnancy 08/20/2020  . Low-lying placenta 07/29/2020  . Language barrier affecting health care 07/29/2020  . Supervision of low-risk pregnancy, second trimester 06/22/2020  . Elevated rheumatoid factor 02/13/2017  . Chronic bilateral low back pain without sciatica 10/25/2016  . Varicose vein of leg 04/08/2016  . Depression 04/08/2016  . Vitamin D insufficiency 03/03/2016  . Irregular menstrual cycle 03/02/2016  . Polycystic ovaries 07/24/2015  . Allergic rhinitis 12/08/2014  . PAC (premature atrial contraction) 06/27/2014  . Fatigue 06/27/2014  . Cervical radiculopathy at C6 06/27/2014    O BP 102/71 (BP Location: Right Arm)   Pulse (!) 101   Temp 98.1 F (36.7 C) (Oral)   Resp 16   Ht 5\' 3"  (1.6 m)   Wt 82.6 kg   LMP 03/06/2020 (Exact Date)   BMI 32.24 kg/m   Patient Vitals for the past 24 hrs:  BP Temp Temp src Pulse Resp Height Weight  08/20/20 1315 102/71 98.1 F (36.7 C) Oral (!) 101 16 - -  08/20/20 1231 128/74 98.6 F (37 C) Oral (!) 114 16 5\' 3"  (1.6 m) 82.6 kg  08/20/20 1204 111/73 98.2 F (36.8 C) - (!) 113 18 - -   VS reviewed, nursing note reviewed,  Constitutional: well developed, well nourished, no distress HEENT: normocephalic CV: normal rate Pulm: normal effort Abdomen: soft, NT, gravid Neuro: alert and oriented x 3, DTRs 2+ Skin: warm,  dry Psych: affect normal  Results Results for orders placed or performed during the hospital encounter of 08/20/20 (from the past 24 hour(s))  CBC     Status: Abnormal   Collection Time: 08/20/20 11:57 AM  Result Value Ref Range   WBC 14.3 (H) 4.0 - 10.5 K/uL   RBC 4.49 3.87 - 5.11 MIL/uL   Hemoglobin 12.8 12.0 - 15.0 g/dL   HCT 08/22/20 08/22/20 - 09.3 %   MCV 86.4 80.0 - 100.0 fL   MCH 28.5 26.0 - 34.0 pg   MCHC 33.0 30.0 - 36.0 g/dL   RDW 81.8 29.9 - 37.1 %   Platelets 248 150 - 400 K/uL   nRBC 0.0 0.0 - 0.2 %  Comprehensive metabolic panel     Status: Abnormal   Collection Time: 08/20/20 11:57 AM  Result Value Ref Range   Sodium 137 135 - 145 mmol/L   Potassium 3.6 3.5 - 5.1 mmol/L   Chloride 106 98 - 111 mmol/L   CO2 19 (L) 22 - 32 mmol/L   Glucose, Bld 136 (H) 70 - 99 mg/dL   BUN 6 6 - 20 mg/dL   Creatinine, Ser 78.9 0.44 - 1.00 mg/dL   Calcium 9.0 8.9 - 08/22/20 mg/dL   Total Protein 6.6 6.5 - 8.1 g/dL   Albumin 3.2 (L) 3.5 - 5.0 g/dL   AST 21 15 - 41 U/L   ALT 11 0 - 44 U/L  Alkaline Phosphatase 46 38 - 126 U/L   Total Bilirubin 0.3 0.3 - 1.2 mg/dL   GFR, Estimated >53 >79 mL/min   Anion gap 12 5 - 15  Protein / creatinine ratio, urine     Status: None   Collection Time: 08/20/20 12:22 PM  Result Value Ref Range   Creatinine, Urine 268.98 mg/dL   Total Protein, Urine 24 mg/dL   Protein Creatinine Ratio 0.09 0.00 - 0.15 mg/mg[Cre]    A 1. Elevated blood pressure affecting pregnancy in second trimester, antepartum 2. [redacted] weeks gestation of pregnancy Medical screening exam complete  P Stable BP here in MAU Normal preeclampsia evaluation labs today Preeclampsia/BP precautions reviewed, warning signs for worsening condition that would warrant emergency follow-up discussed No other complaints or concerns.  Preterm labor and fetal movement precautions reviewed. Follow up at [facility] as scheduled for prenatal care.    Jaynie Collins, MD, FACOG Obstetrician &  Gynecologist, [facility] for [employer], [facility]

## 2020-08-20 NOTE — MAU Note (Signed)
Pt sent from office for elevated b/p. Denies any headache today but stated she sometimes has some blurred vision but not today. Denies any pain or discomfort at [facility] time.

## 2020-08-20 NOTE — MAU Note (Signed)
Pt sent over from office for elevated Bps. Denies headache, endorses blurry vision and floaters that started this week. Denies upper quadrant pain. No abnormal swelling.

## 2020-08-20 NOTE — Patient Instructions (Signed)
Hypertension During Pregnancy Hypertension is also called high blood pressure. High blood pressure means that the force of the blood moving in your body is high enough to cause problems for you and your baby. Different types of high blood pressure can happen during pregnancy. The types are:  High blood pressure before you got pregnant. This is called chronic hypertension.  This can continue during your pregnancy. Your doctor will want to keep checking your blood pressure. You may need medicine to control your blood pressure while you are pregnant. You will need follow-up visits after you have your baby.  High blood pressure that goes up during pregnancy when it was normal before. This is called gestational hypertension. It will often get better after you have your baby, but your doctor will need to watch your blood pressure to make sure that it is getting better.  You may develop high blood pressure after giving birth. This is called postpartum hypertension. This often occurs within 48 hours after childbirth but may occur up to 6 weeks after giving birth. Very high blood pressure during pregnancy is an emergency that needs treatment right away. How does this affect me? If you have high blood pressure during pregnancy, you have a higher chance of developing high blood pressure:  As you get older.  If you get pregnant again. In some cases, high blood pressure during pregnancy can cause:  Stroke.  Heart attack.  Damage to the kidneys, lungs, or liver.  Preeclampsia.  HELLP syndrome.  Seizures.  Problems with the placenta. How does this affect my baby? Your baby may:  Be born early.  Not weigh as much as he or she should.  Not handle labor well, leading to a C-section. This condition may also result in a baby's death before birth (stillbirth). What are the risks?  Having high blood pressure during a past pregnancy.  Being overweight.  Being age 35 or older.  Being pregnant  for the first time.  Being pregnant with more than one baby.  Becoming pregnant using fertility methods, such as IVF.  Having other problems, such as diabetes or kidney disease. What can I do to lower my risk?  Keep a healthy weight.  Eat a healthy diet.  Follow what your doctor tells you about treating any medical problems that you had before you got pregnant. It is very important to go to all of your doctor visits. Your doctor will check your blood pressure and make sure that your pregnancy is progressing as it should. Treatment should start early if a problem is found.   How is this treated? Treatment for high blood pressure during pregnancy can vary. It depends on the type of high blood pressure you have and how serious it is.  If you were taking medicine for your blood pressure before you got pregnant, talk with your doctor. You may need to change the medicine during pregnancy if it is not safe for your baby.  If your blood pressure goes up during pregnancy, your doctor may order medicine to treat this.  If you are at risk for preeclampsia, your doctor may tell you to take a low-dose aspirin while you are pregnant.  If you have very high blood pressure, you may need to stay in the hospital so you and your baby can be watched closely. You may also need to take medicine to lower your blood pressure.  In some cases, if your condition gets worse, you may need to have your baby early.   Follow these instructions at home: Eating and drinking  Drink enough fluid to keep your pee (urine) pale yellow.  Avoid caffeine.   Lifestyle  Do not smoke or use any products that contain nicotine or tobacco. If you need help quitting, ask your doctor.  Do not use alcohol or drugs.  Avoid stress.  Rest and get plenty of sleep.  Regular exercise can help. Ask your doctor what kinds of exercise are best for you. General instructions  Take over-the-counter and prescription medicines only as  told by your doctor.  Keep all prenatal and follow-up visits. Contact a doctor if:  You have symptoms that your doctor told you to watch for, such as: ? Headaches. ? A feeling like you may vomit (nausea). ? Vomiting. ? Belly (abdominal) pain. ? Feeling dizzy or light-headed. Get help right away if:  You have symptoms of serious problems, such as: ? Very bad belly pain that does not get better with treatment. ? A very bad headache that does not get better. ? Blurry vision. ? Double vision. ? Vomiting that does not get better. ? Sudden, fast weight gain. ? Sudden swelling in your hands, ankles, or face. ? Bleeding from your vagina. ? Blood in your pee. ? Shortness of breath. ? Chest pain. ? Weakness on one side of your body. ? Trouble talking.  Your baby is not moving as much as usual. These symptoms may be an emergency. Get help right away. Call your local emergency services (911 in the [place]  Do not wait to see if the symptoms will go away.  Do not drive yourself to the hospital. Summary  High blood pressure is also called hypertension.  High blood pressure means that the force of the blood moving in your body is high enough to cause problems for you and your baby.  Get help right away if you have symptoms of serious problems due to high blood pressure.  Keep all prenatal and follow-up visits. This information is not intended to replace advice given to you by your health care provider. Make sure you discuss any questions you have with your health care provider. Document Revised: 04/02/2020 Document Reviewed: 04/02/2020 Elsevier Patient Education  2021 Elsevier Inc.  

## 2020-08-27 ENCOUNTER — Ambulatory Visit (INDEPENDENT_AMBULATORY_CARE_PROVIDER_SITE_OTHER): Payer: Medicaid Other

## 2020-08-27 ENCOUNTER — Ambulatory Visit: Payer: Medicaid Other

## 2020-08-27 ENCOUNTER — Other Ambulatory Visit: Payer: Self-pay

## 2020-08-27 VITALS — BP 118/74 | HR 79 | Wt 183.2 lb

## 2020-08-27 DIAGNOSIS — Z0130 Encounter for examination of blood pressure without abnormal findings: Secondary | ICD-10-CM | POA: Diagnosis not present

## 2020-08-27 NOTE — Progress Notes (Signed)
Video Interpreter # Sabre [id number] Pt presented with elevated BP in office visit however BP WNL in MAU on 08/20/20.  Pt was not started on BP medication and is here today for BP check.  BP 118/74.   Pt denies headache and visual disturbances.  Pt advised to monitor for sx's of HTN and that we will f/u with her at her next prenatal appt scheduled on 09/10/20 .  Pt verbalized understanding with no further questions   Addison Naegeli, RN  08/27/20

## 2020-08-28 ENCOUNTER — Ambulatory Visit: Payer: Medicaid Other | Admitting: *Deleted

## 2020-08-28 ENCOUNTER — Encounter: Payer: Self-pay | Admitting: *Deleted

## 2020-08-28 ENCOUNTER — Other Ambulatory Visit: Payer: Self-pay | Admitting: *Deleted

## 2020-08-28 ENCOUNTER — Ambulatory Visit: Payer: Medicaid Other | Attending: Obstetrics

## 2020-08-28 DIAGNOSIS — O2692 Pregnancy related conditions, unspecified, second trimester: Secondary | ICD-10-CM | POA: Diagnosis not present

## 2020-08-28 DIAGNOSIS — O99412 Diseases of the circulatory system complicating pregnancy, second trimester: Secondary | ICD-10-CM

## 2020-08-28 DIAGNOSIS — O4440 Low lying placenta NOS or without hemorrhage, unspecified trimester: Secondary | ICD-10-CM | POA: Insufficient documentation

## 2020-08-28 DIAGNOSIS — Z3492 Encounter for supervision of normal pregnancy, unspecified, second trimester: Secondary | ICD-10-CM | POA: Diagnosis present

## 2020-08-28 DIAGNOSIS — O4442 Low lying placenta NOS or without hemorrhage, second trimester: Secondary | ICD-10-CM

## 2020-08-28 DIAGNOSIS — O98512 Other viral diseases complicating pregnancy, second trimester: Secondary | ICD-10-CM

## 2020-08-28 DIAGNOSIS — Z8616 Personal history of COVID-19: Secondary | ICD-10-CM

## 2020-08-28 DIAGNOSIS — Z362 Encounter for other antenatal screening follow-up: Secondary | ICD-10-CM

## 2020-08-28 NOTE — Progress Notes (Signed)
Patient seen and assessed by nursing staff.  Agree with documentation and plan.  

## 2020-09-10 ENCOUNTER — Other Ambulatory Visit: Payer: Self-pay

## 2020-09-10 ENCOUNTER — Ambulatory Visit (INDEPENDENT_AMBULATORY_CARE_PROVIDER_SITE_OTHER): Payer: Medicaid Other | Admitting: Family Medicine

## 2020-09-10 VITALS — BP 118/79 | HR 116 | Wt 185.2 lb

## 2020-09-10 DIAGNOSIS — Z789 Other specified health status: Secondary | ICD-10-CM

## 2020-09-10 DIAGNOSIS — O99212 Obesity complicating pregnancy, second trimester: Secondary | ICD-10-CM

## 2020-09-10 DIAGNOSIS — O99210 Obesity complicating pregnancy, unspecified trimester: Secondary | ICD-10-CM | POA: Insufficient documentation

## 2020-09-10 DIAGNOSIS — Z603 Acculturation difficulty: Secondary | ICD-10-CM

## 2020-09-10 DIAGNOSIS — O0990 Supervision of high risk pregnancy, unspecified, unspecified trimester: Secondary | ICD-10-CM

## 2020-09-10 NOTE — Progress Notes (Signed)
   PRENATAL VISIT NOTE  Subjective:  Megan [name] is a 30 y.o. G3P2002 at [redacted]w[redacted]d being seen today for ongoing prenatal care.  She is currently monitored for the following issues for this high-risk pregnancy and has PAC (premature atrial contraction); Fatigue; Cervical radiculopathy at C6; Allergic rhinitis; Polycystic ovaries; Irregular menstrual cycle; Vitamin D insufficiency; Varicose vein of leg; Depression; Chronic bilateral low back pain without sciatica; Elevated rheumatoid factor; Supervision of high risk pregnancy, antepartum; Language barrier affecting health care; and Obesity affecting pregnancy on their problem list.  Patient reports no complaints.  Contractions: Not present. Vag. Bleeding: None.  Movement: Present. Denies leaking of fluid.   The following portions of the patient's history were reviewed and updated as appropriate: allergies, current medications, past family history, past medical history, past social history, past surgical history and problem list.   Objective:   Vitals:   09/10/20 1051 09/10/20 1059  BP: (!) 131/91 118/79  Pulse: (!) 115 (!) 116  Weight: 185 lb 3.2 oz (84 kg)     Fetal Status: Fetal Heart Rate (bpm): 153   Movement: Present     General:  Alert, oriented and cooperative. Patient is in no acute distress.  Skin: Skin is warm and dry. No rash noted.   Cardiovascular: Normal heart rate noted  Respiratory: Normal respiratory effort, no problems with respiration noted  Abdomen: Soft, gravid, appropriate for gestational age.  Pain/Pressure: Absent     Pelvic: Cervical exam deferred        Extremities: Normal range of motion.  Edema: None  Mental Status: Normal mood and affect. Normal behavior. Normal judgment and thought content.   Assessment and Plan:  Pregnancy: G3P2002 at [redacted]w[redacted]d 1. Supervision of high risk pregnancy, antepartum UTD No concerns today [place] 2/4 showed normal growth MFM recommended repeat growth in 4 weeks, scheduled  2. Language  barrier affecting health care Burmese  3. Obesity affecting pregnancy in second trimester TWG= 13 lb 3.2 oz (5.987 kg) Reviewed recommended TWG 11-15   Preterm labor symptoms and general obstetric precautions including but not limited to vaginal bleeding, contractions, leaking of fluid and fetal movement were reviewed in detail with the patient. Please refer to After Visit Summary for other counseling recommendations.   Return in about 4 weeks (around 10/08/2020) for Routine prenatal care, MD or APP, GTT.  Future Appointments  Date Time Provider Department Center  09/25/2020  9:00 AM [facility] NURSE [facility]  09/25/2020  9:15 AM [facility]    Federico Flake, MD

## 2020-09-10 NOTE — Progress Notes (Unsigned)
Denies headaches  Elevated bp , repeated wnl Linda,RN

## 2020-09-25 ENCOUNTER — Other Ambulatory Visit: Payer: Self-pay

## 2020-09-25 ENCOUNTER — Other Ambulatory Visit: Payer: Self-pay | Admitting: Obstetrics and Gynecology

## 2020-09-25 ENCOUNTER — Ambulatory Visit: Payer: Medicaid Other | Admitting: *Deleted

## 2020-09-25 ENCOUNTER — Encounter: Payer: Self-pay | Admitting: *Deleted

## 2020-09-25 ENCOUNTER — Ambulatory Visit: Payer: Medicaid Other | Attending: Obstetrics and Gynecology

## 2020-09-25 DIAGNOSIS — Z3A27 27 weeks gestation of pregnancy: Secondary | ICD-10-CM

## 2020-09-25 DIAGNOSIS — O99891 Other specified diseases and conditions complicating pregnancy: Secondary | ICD-10-CM

## 2020-09-25 DIAGNOSIS — I491 Atrial premature depolarization: Secondary | ICD-10-CM

## 2020-09-25 DIAGNOSIS — O99212 Obesity complicating pregnancy, second trimester: Secondary | ICD-10-CM | POA: Insufficient documentation

## 2020-09-25 DIAGNOSIS — O99413 Diseases of the circulatory system complicating pregnancy, third trimester: Secondary | ICD-10-CM

## 2020-09-25 DIAGNOSIS — O4440 Low lying placenta NOS or without hemorrhage, unspecified trimester: Secondary | ICD-10-CM

## 2020-09-25 DIAGNOSIS — O98512 Other viral diseases complicating pregnancy, second trimester: Secondary | ICD-10-CM | POA: Diagnosis not present

## 2020-09-25 DIAGNOSIS — U071 COVID-19: Secondary | ICD-10-CM | POA: Insufficient documentation

## 2020-09-25 DIAGNOSIS — O0990 Supervision of high risk pregnancy, unspecified, unspecified trimester: Secondary | ICD-10-CM | POA: Diagnosis present

## 2020-09-25 DIAGNOSIS — O99412 Diseases of the circulatory system complicating pregnancy, second trimester: Secondary | ICD-10-CM | POA: Diagnosis not present

## 2020-09-25 DIAGNOSIS — R7989 Other specified abnormal findings of blood chemistry: Secondary | ICD-10-CM

## 2020-09-25 DIAGNOSIS — Z8616 Personal history of COVID-19: Secondary | ICD-10-CM | POA: Diagnosis not present

## 2020-09-25 DIAGNOSIS — O4442 Low lying placenta NOS or without hemorrhage, second trimester: Secondary | ICD-10-CM

## 2020-09-25 DIAGNOSIS — O2690 Pregnancy related conditions, unspecified, unspecified trimester: Secondary | ICD-10-CM

## 2020-10-06 ENCOUNTER — Other Ambulatory Visit: Payer: Self-pay | Admitting: Lactation Services

## 2020-10-06 DIAGNOSIS — O0990 Supervision of high risk pregnancy, unspecified, unspecified trimester: Secondary | ICD-10-CM

## 2020-10-08 ENCOUNTER — Other Ambulatory Visit: Payer: Self-pay

## 2020-10-08 ENCOUNTER — Ambulatory Visit (INDEPENDENT_AMBULATORY_CARE_PROVIDER_SITE_OTHER): Payer: Medicaid Other | Admitting: Obstetrics and Gynecology

## 2020-10-08 ENCOUNTER — Other Ambulatory Visit: Payer: Medicaid Other

## 2020-10-08 VITALS — BP 110/65 | HR 103 | Wt 189.3 lb

## 2020-10-08 DIAGNOSIS — O99212 Obesity complicating pregnancy, second trimester: Secondary | ICD-10-CM

## 2020-10-08 DIAGNOSIS — O0990 Supervision of high risk pregnancy, unspecified, unspecified trimester: Secondary | ICD-10-CM

## 2020-10-08 DIAGNOSIS — O99210 Obesity complicating pregnancy, unspecified trimester: Secondary | ICD-10-CM

## 2020-10-08 DIAGNOSIS — Z23 Encounter for immunization: Secondary | ICD-10-CM

## 2020-10-08 DIAGNOSIS — Z789 Other specified health status: Secondary | ICD-10-CM

## 2020-10-08 DIAGNOSIS — O0992 Supervision of high risk pregnancy, unspecified, second trimester: Secondary | ICD-10-CM

## 2020-10-08 DIAGNOSIS — Z3A29 29 weeks gestation of pregnancy: Secondary | ICD-10-CM | POA: Insufficient documentation

## 2020-10-08 NOTE — Patient Instructions (Addendum)
Oral Glucose Tolerance Test During Pregnancy Why am I having this test? The oral glucose tolerance test (OGTT) is done to check how your body processes blood sugar (glucose). This is one of several tests used to diagnose diabetes that develops during pregnancy (gestational diabetes mellitus). Gestational diabetes is a short-term form of diabetes that some women develop while they are pregnant. It usually occurs during the second trimester of pregnancy and goes away after delivery. Testing, or screening, for gestational diabetes usually occurs at weeks 24-28 of pregnancy. You may have the OGTT test after having a 1-hour glucose screening test if the results from that test indicate that you may have gestational diabetes. This test may also be needed if:  You have a history of gestational diabetes.  There is a history of giving birth to very large babies or of losing pregnancies (having stillbirths).  You have signs and symptoms of diabetes, such as: ? Changes in your eyesight. ? Tingling or numbness in your hands or feet. ? Changes in hunger, thirst, and urination, and these are not explained by your pregnancy. What is being tested? This test measures the amount of glucose in your blood at different times during a period of 3 hours. This shows how well your body can process glucose. What kind of sample is taken? Blood samples are required for this test. They are usually collected by inserting a needle into a blood vessel.   How do I prepare for this test?  For 3 days before your test, eat normally. Have plenty of carbohydrate-rich foods.  Follow instructions from your health care provider about: ? Eating or drinking restrictions on the day of the test. You may be asked not to eat or drink anything other than water (to fast) starting 8-10 hours before the test. ? Changing or stopping your regular medicines. Some medicines may interfere with this test. Tell a health care provider about:  All  medicines you are taking, including vitamins, herbs, eye drops, creams, and over-the-counter medicines.  Any blood disorders you have.  Any surgeries you have had.  Any medical conditions you have. What happens during the test? First, your blood glucose will be measured. This is referred to as your fasting blood glucose because you fasted before the test. Then, you will drink a glucose solution that contains a certain amount of glucose. Your blood glucose will be measured again 1, 2, and 3 hours after you drink the solution. This test takes about 3 hours to complete. You will need to stay at the testing location during this time. During the testing period:  Do not eat or drink anything other than the glucose solution.  Do not exercise.  Do not use any products that contain nicotine or tobacco, such as cigarettes, e-cigarettes, and chewing tobacco. These can affect your test results. If you need help quitting, ask your health care provider. The testing procedure may vary among health care providers and hospitals. How are the results reported? Your results will be reported as milligrams of glucose per deciliter of blood (mg/dL) or millimoles per liter (mmol/L). There is more than one source for screening and diagnosis reference values used to diagnose gestational diabetes. Your health care provider will compare your results to normal values that were established after testing a large group of people (reference values). Reference values may vary among labs and hospitals. For this test (Carpenter-Coustan), reference values are:  Fasting: 95 mg/dL (5.3 mmol/L).  1 hour: 180 mg/dL (10.0 mmol/L).  2 hour:   155 mg/dL (8.6 mmol/L).  3 hour: 140 mg/dL (7.8 mmol/L). What do the results mean? Results below the reference values are considered normal. If two or more of your blood glucose levels are at or above the reference values, you may be diagnosed with gestational diabetes. If only one level is  high, your health care provider may suggest repeat testing or other tests to confirm a diagnosis. Talk with your health care provider about what your results mean. Questions to ask your health care provider Ask your health care provider, or the department that is doing the test:  When will my results be ready?  How will I get my results?  What are my treatment options?  What other tests do I need?  What are my next steps? Summary  The oral glucose tolerance test (OGTT) is one of several tests used to diagnose diabetes that develops during pregnancy (gestational diabetes mellitus). Gestational diabetes is a short-term form of diabetes that some women develop while they are pregnant.  You may have the OGTT test after having a 1-hour glucose screening test if the results from that test show that you may have gestational diabetes. You may also have this test if you have any symptoms or risk factors for this type of diabetes.  Talk with your health care provider about what your results mean. This information is not intended to replace advice given to you by your health care provider. Make sure you discuss any questions you have with your health care provider. Document Revised: 12/19/2019 Document Reviewed: 12/19/2019 Elsevier Patient Education  2021 [place].  Round Ligament Pain  The round ligament is a cord of muscle and tissue that helps support the uterus. It can become a source of pain during pregnancy if it becomes stretched or twisted as the baby grows. The pain usually begins in the second trimester (13-28 weeks) of pregnancy, and it can come and go until the baby is delivered. It is not a serious problem, and it does not cause harm to the baby. Round ligament pain is usually a short, sharp, and pinching pain, but it can also be a dull, lingering, and aching pain. The pain is felt in the lower side of the abdomen or in the groin. It usually starts deep in the groin and moves up to  the outside of the hip area. The pain may occur when you:  Suddenly change position, such as quickly going from a sitting to standing position.  Roll over in bed.  Cough or sneeze.  Do physical activity. Follow these instructions at home:  Watch your condition for any changes.  When the pain starts, relax. Then try any of these methods to help with the pain: ? Sitting down. ? Flexing your knees up to your abdomen. ? Lying on your side with one pillow under your abdomen and another pillow between your legs. ? Sitting in a warm bath for 15-20 minutes or until the pain goes away.  Take over-the-counter and prescription medicines only as told by your health care provider.  Move slowly when you sit down or stand up.  Avoid long walks if they cause pain.  Stop or reduce your physical activities if they cause pain.  Keep all follow-up visits as told by your health care provider. This is important.   Contact a health care provider if:  Your pain does not go away with treatment.  You feel pain in your back that you did not have before.  Your  medicine is not helping. Get help right away if:  You have a fever or chills.  You develop uterine contractions.  You have vaginal bleeding.  You have nausea or vomiting.  You have diarrhea.  You have pain when you urinate. Summary  Round ligament pain is felt in the lower abdomen or groin. It is usually a short, sharp, and pinching pain. It can also be a dull, lingering, and aching pain.  This pain usually begins in the second trimester (13-28 weeks). It occurs because the uterus is stretching with the growing baby, and it is not harmful to the baby.  You may notice the pain when you suddenly change position, when you cough or sneeze, or during physical activity.  Relaxing, flexing your knees to your abdomen, lying on one side, or taking a warm bath may help to get rid of the pain.  Get help from your health care provider if the  pain does not go away or if you have vaginal bleeding, nausea, vomiting, diarrhea, or painful urination. This information is not intended to replace advice given to you by your health care provider. Make sure you discuss any questions you have with your health care provider. Document Revised: 12/27/2017 Document Reviewed: 12/27/2017 Elsevier Patient Education  2021 Elsevier Inc.  

## 2020-10-08 NOTE — Progress Notes (Signed)
   PRENATAL VISIT NOTE  Subjective:  Megan [name] is a 30 y.o. G3P2002 at [redacted]w[redacted]d being seen today for ongoing prenatal care.  She is currently monitored for the following issues for this high-risk pregnancy and has PAC (premature atrial contraction); Fatigue; Cervical radiculopathy at C6; Allergic rhinitis; Polycystic ovaries; Irregular menstrual cycle; Vitamin D insufficiency; Varicose vein of leg; Depression; Chronic bilateral low back pain without sciatica; Elevated rheumatoid factor; Supervision of high risk pregnancy, antepartum; Language barrier affecting health care; Obesity affecting pregnancy; and [redacted] weeks gestation of pregnancy on their problem list.  Patient doing well with no acute concerns today. She reports mild lower pelvic pain.  Contractions: Not present. Vag. Bleeding: None.  Movement: Present. Denies leaking of fluid.   The following portions of the patient's history were reviewed and updated as appropriate: allergies, current medications, past family history, past medical history, past social history, past surgical history and problem list. Problem list updated.  Objective:   Vitals:   10/08/20 0949  BP: 110/65  Pulse: (!) 103  Weight: 189 lb 4.8 oz (85.9 kg)    Fetal Status: Fetal Heart Rate (bpm): 140 Fundal Height: 30 cm Movement: Present     General:  Alert, oriented and cooperative. Patient is in no acute distress.  Skin: Skin is warm and dry. No rash noted.   Cardiovascular: Normal heart rate noted  Respiratory: Normal respiratory effort, no problems with respiration noted  Abdomen: Soft, gravid, appropriate for gestational age.  Pain/Pressure: Present     Pelvic: Cervical exam deferred        Extremities: Normal range of motion.  Edema: None  Mental Status:  Normal mood and affect. Normal behavior. Normal judgment and thought content.   Assessment and Plan:  Pregnancy: G3P2002 at [redacted]w[redacted]d  1. Supervision of high risk pregnancy, antepartum 2 hour GTT today -  Tdap vaccine greater than or equal to 7yo IM  2. Language barrier affecting health care Interpreter utilized  3. Obesity affecting pregnancy, antepartum   4. [redacted] weeks gestation of pregnancy   Preterm labor symptoms and general obstetric precautions including but not limited to vaginal bleeding, contractions, leaking of fluid and fetal movement were reviewed in detail with the patient.  Please refer to After Visit Summary for other counseling recommendations.   Return in about 2 weeks (around 10/22/2020) for ROB, in person.   Mariel Aloe, MD Faculty Attending Center for [facility]

## 2020-10-09 ENCOUNTER — Telehealth: Payer: Self-pay | Admitting: Lactation Services

## 2020-10-09 LAB — CBC
Hematocrit: 39.3 % (ref 34.0–46.6)
Hemoglobin: 13.1 g/dL (ref 11.1–15.9)
MCH: 27.7 pg (ref 26.6–33.0)
MCHC: 33.3 g/dL (ref 31.5–35.7)
MCV: 83 fL (ref 79–97)
Platelets: 277 10*3/uL (ref 150–450)
RBC: 4.73 x10E6/uL (ref 3.77–5.28)
RDW: 13.3 % (ref 11.7–15.4)
WBC: 14.5 10*3/uL — ABNORMAL HIGH (ref 3.4–10.8)

## 2020-10-09 LAB — RPR: RPR Ser Ql: NONREACTIVE

## 2020-10-09 LAB — GLUCOSE TOLERANCE, 2 HOURS W/ 1HR
Glucose, 1 hour: 183 mg/dL — ABNORMAL HIGH (ref 65–179)
Glucose, 2 hour: 134 mg/dL (ref 65–152)
Glucose, Fasting: 90 mg/dL (ref 65–91)

## 2020-10-09 LAB — HIV ANTIBODY (ROUTINE TESTING W REFLEX): HIV Screen 4th Generation wRfx: NONREACTIVE

## 2020-10-09 NOTE — Telephone Encounter (Signed)
-----   Message from Warden Fillers, MD sent at 10/09/2020  9:52 AM EDT ----- Failed 2 hour GTT, will refer to diabetic teaching

## 2020-10-09 NOTE — Telephone Encounter (Signed)
Called patient with assistance of [employer], [phone].   Patient was informed of her GTT results. She was informed that she will be scheduled for Diabetes Education and will be taught to check her blood sugars 4 times a day. Patient voiced understanding and does not have any questions.   Message to front desk to call and schedule patient with Diabetes Education.

## 2020-10-22 ENCOUNTER — Other Ambulatory Visit: Payer: Self-pay | Admitting: *Deleted

## 2020-10-22 ENCOUNTER — Ambulatory Visit (INDEPENDENT_AMBULATORY_CARE_PROVIDER_SITE_OTHER): Payer: Medicaid Other | Admitting: Obstetrics and Gynecology

## 2020-10-22 ENCOUNTER — Ambulatory Visit: Payer: Medicaid Other | Admitting: Pharmacist

## 2020-10-22 ENCOUNTER — Encounter: Payer: Self-pay | Admitting: Obstetrics and Gynecology

## 2020-10-22 ENCOUNTER — Other Ambulatory Visit: Payer: Self-pay

## 2020-10-22 VITALS — BP 105/65 | HR 104 | Wt 192.6 lb

## 2020-10-22 DIAGNOSIS — E669 Obesity, unspecified: Secondary | ICD-10-CM

## 2020-10-22 DIAGNOSIS — O24419 Gestational diabetes mellitus in pregnancy, unspecified control: Secondary | ICD-10-CM

## 2020-10-22 DIAGNOSIS — Z7189 Other specified counseling: Secondary | ICD-10-CM

## 2020-10-22 DIAGNOSIS — Z789 Other specified health status: Secondary | ICD-10-CM

## 2020-10-22 DIAGNOSIS — O99213 Obesity complicating pregnancy, third trimester: Secondary | ICD-10-CM

## 2020-10-22 DIAGNOSIS — O0990 Supervision of high risk pregnancy, unspecified, unspecified trimester: Secondary | ICD-10-CM

## 2020-10-22 DIAGNOSIS — Z3A31 31 weeks gestation of pregnancy: Secondary | ICD-10-CM

## 2020-10-22 DIAGNOSIS — U071 Other viral diseases complicating pregnancy, second trimester: Secondary | ICD-10-CM | POA: Insufficient documentation

## 2020-10-22 DIAGNOSIS — O98512 Other viral diseases complicating pregnancy, second trimester: Secondary | ICD-10-CM

## 2020-10-22 DIAGNOSIS — O99210 Obesity complicating pregnancy, unspecified trimester: Secondary | ICD-10-CM

## 2020-10-22 DIAGNOSIS — R768 Other specified abnormal immunological findings in serum: Secondary | ICD-10-CM

## 2020-10-22 HISTORY — DX: Other viral diseases complicating pregnancy, second trimester: U07.1

## 2020-10-22 HISTORY — DX: Other viral diseases complicating pregnancy, second trimester: O98.512

## 2020-10-22 MED ORDER — ACCU-CHEK SOFTCLIX LANCETS MISC: 5 refills | Status: DC

## 2020-10-22 MED ORDER — ACCU-CHEK GUIDE VI STRP: ORAL_STRIP | 5 refills | Status: DC

## 2020-10-22 NOTE — Progress Notes (Signed)
Video Interpreter # Edwena Felty [phone]

## 2020-10-22 NOTE — Progress Notes (Signed)
Medication Management Clinic Visit Note  Patient: Megan [name] MRN: [id number] Date of Birth: 1990/12/02 PCP: Hoy Register, MD   [facility] 30 y.o. female presents for a gestational diabetes education visit today including education for blood glucose monitoring, basic nutrition information, and general information about gestational diabetes. Visit accompanied by virtual [place]mese interpreter [phone].  LMP 03/06/2020 (Exact Date)   Patient Information   Past Medical History:  Diagnosis Date  . COVID-19 affecting pregnancy in second trimester 10/22/2020   08/01/20   . Irregular menstrual cycle 03/02/2016  . Medical history non-contributory   . PAC (premature atrial contraction) 06/27/2014  . Polycystic ovaries 07/24/2015      Family History  Problem Relation Age of Onset  . Diabetes Mother   . Hypertension Mother     Lifestyle Diet: Patient describes diet as including a fist-sized amount of rice with usually some sort of protein and vegetables. She denies eating many sweets.   She mentioned doing walks around her home for exercise.     Social History   Substance and Sexual Activity  Alcohol Use No     Social History   Tobacco Use  Smoking Status Never Smoker  Smokeless Tobacco Never Used    Assessment and Plan: Provided patient with Accu-Chek glucometer and completed instructions on use of lancet device, test strips, and glucometer. Discussed how to appropriately use lancet device and locations for use. Patient was able to practice and demonstrate appropriate use. Patient was provided a blood glucose log to document numbers.  Discussed basic nutritional information and encouraged her to keep note of any meals or symptoms experienced if her blood glucose numbers were outside of the goal ranges discussed (FBG<95, PPBG <120 mg/dL). Patient did mention that she does not know how to write in [place] or in [place]. Encouraged patient to at least copy numbers from glucometer  and if able to have someone at home write additional information down. Patient agreed.  Rx sent for lancets and test strips for Accu-Chek meter. No handouts were provided as none were available in her native language. Will plan to have patient schedule appointment with Heywood Bene, RD for further education.

## 2020-10-22 NOTE — Progress Notes (Signed)
   PRENATAL VISIT NOTE  Subjective:  Megan [name] is a 30 y.o. G3P2002 at [redacted]w[redacted]d being seen today for ongoing prenatal care.  She is currently monitored for the following issues for this high-risk pregnancy and has Cervical radiculopathy at C6; Allergic rhinitis; Polycystic ovaries; Vitamin D insufficiency; Varicose vein of leg; Depression; Chronic bilateral low back pain without sciatica; Elevated rheumatoid factor; Supervision of high risk pregnancy, antepartum; Language barrier affecting health care; Obesity affecting pregnancy; COVID-19 affecting pregnancy in second trimester; GDM (gestational diabetes mellitus); and Obesity (BMI 30-39.9) on their problem list.  Patient reports no complaints.  Contractions: Not present. Vag. Bleeding: None.  Movement: Present. Denies leaking of fluid.   The following portions of the patient's history were reviewed and updated as appropriate: allergies, current medications, past family history, past medical history, past social history, past surgical history and problem list.   Objective:   Vitals:   10/22/20 1015  BP: 105/65  Pulse: (!) 104  Weight: 192 lb 9.6 oz (87.4 kg)    Fetal Status: Fetal Heart Rate (bpm): 138 Fundal Height: 33 cm Movement: Present     General:  Alert, oriented and cooperative. Patient is in no acute distress.  Skin: Skin is warm and dry. No rash noted.   Cardiovascular: Normal heart rate noted  Respiratory: Normal respiratory effort, no problems with respiration noted  Abdomen: Soft, gravid, appropriate for gestational age.  Pain/Pressure: Present     Pelvic: Cervical exam deferred        Extremities: Normal range of motion.  Edema: None  Mental Status: Normal mood and affect. Normal behavior. Normal judgment and thought content.   Assessment and Plan:  Pregnancy: G3P2002 at [redacted]w[redacted]d 1. COVID-19 affecting pregnancy in second trimester Early January 2022. No sequelae  2. [redacted] weeks gestation of pregnancy  3. Language  barrier affecting health care Interpreter used  4. Supervision of high risk pregnancy, antepartum Routine care. Has 4/22 routine growth u/s already scheduled.  - Sjogrens syndrome-A extractable nuclear antibody - Sjogrens syndrome-B extractable nuclear antibody - Cardiolipin antibodies, IgM+IgG - Beta-2 Glycoprotein I Ab,G/M - Lupus anticoagulant - TSH  5. Gestational diabetes mellitus (GDM) in third trimester, gestational diabetes method of control unspecified Pt dx'ed on 3/17. Pt was unaware of DM education appt earlier today and nothing documented in the computer confirming her appointment. Unfortunately next dm appt is on 4/12. Supplies given and teaching d/w pt and will RTC 1wk to check sugars  6. Elevated rheumatoid factor Pt states she has never seen a rheumatologist.  - Sjogrens syndrome-A extractable nuclear antibody - Sjogrens syndrome-B extractable nuclear antibody - Cardiolipin antibodies, IgM+IgG - Beta-2 Glycoprotein I Ab,G/M - Lupus anticoagulant - TSH  7. Obesity affecting pregnancy in third trimester  8. Obesity (BMI 30-39.9)  Preterm labor symptoms and general obstetric precautions including but not limited to vaginal bleeding, contractions, leaking of fluid and fetal movement were reviewed in detail with the patient. Please refer to After Visit Summary for other counseling recommendations.     Future Appointments  Date Time Provider Department Center  11/13/2020  9:00 AM [facility] NURSE [facility]  11/13/2020  9:15 AM [facility]    Laie Bing, MD

## 2020-10-24 LAB — CARDIOLIPIN ANTIBODIES, IGM+IGG
Anticardiolipin IgG: <9 GPL U/mL (ref 0–14)
Anticardiolipin IgM: <9 MPL U/mL (ref 0–12)

## 2020-10-24 LAB — LUPUS ANTICOAGULANT
Dilute Viper Venom Time: 27.1 s (ref 0.0–47.0)
PTT Lupus Anticoagulant: 28.5 s (ref 0.0–51.9)
Thrombin Time: 16.2 s (ref 0.0–23.0)
dPT Confirm Ratio: 1.01 Ratio (ref 0.00–1.34)
dPT: 26.3 s (ref 0.0–47.6)

## 2020-10-24 LAB — SJOGRENS SYNDROME-A EXTRACTABLE NUCLEAR ANTIBODY: ENA SSA (RO) Ab: <0.2 AI (ref 0.0–0.9)

## 2020-10-24 LAB — TSH: TSH: 1.45 u[IU]/mL (ref 0.450–4.500)

## 2020-10-24 LAB — BETA-2 GLYCOPROTEIN I AB,G/M
Beta-2 Glyco 1 IgM: 22 GPI IgM units (ref 0–32)
Beta-2 Glyco I IgG: <9 GPI IgG units (ref 0–20)

## 2020-10-24 LAB — SJOGRENS SYNDROME-B EXTRACTABLE NUCLEAR ANTIBODY: ENA SSB (LA) Ab: <0.2 AI (ref 0.0–0.9)

## 2020-11-02 ENCOUNTER — Ambulatory Visit: Payer: Medicaid Other | Admitting: Obstetrics and Gynecology

## 2020-11-09 ENCOUNTER — Ambulatory Visit (INDEPENDENT_AMBULATORY_CARE_PROVIDER_SITE_OTHER): Payer: Medicaid Other | Admitting: Family Medicine

## 2020-11-09 ENCOUNTER — Encounter: Payer: Self-pay | Admitting: Family Medicine

## 2020-11-09 ENCOUNTER — Other Ambulatory Visit: Payer: Self-pay

## 2020-11-09 VITALS — BP 126/84 | HR 98 | Wt 197.7 lb

## 2020-11-09 DIAGNOSIS — O24410 Gestational diabetes mellitus in pregnancy, diet controlled: Secondary | ICD-10-CM

## 2020-11-09 DIAGNOSIS — K5901 Slow transit constipation: Secondary | ICD-10-CM

## 2020-11-09 DIAGNOSIS — O99213 Obesity complicating pregnancy, third trimester: Secondary | ICD-10-CM

## 2020-11-09 DIAGNOSIS — O0990 Supervision of high risk pregnancy, unspecified, unspecified trimester: Secondary | ICD-10-CM

## 2020-11-09 DIAGNOSIS — O0993 Supervision of high risk pregnancy, unspecified, third trimester: Secondary | ICD-10-CM

## 2020-11-09 DIAGNOSIS — K649 Unspecified hemorrhoids: Secondary | ICD-10-CM

## 2020-11-09 MED ORDER — DOCUSATE SODIUM 100 MG PO CAPS: 100.0000 mg | ORAL_CAPSULE | Freq: Two times a day (BID) | ORAL | 5 refills | Status: DC

## 2020-11-09 MED ORDER — HYDROCORTISONE (PERIANAL) 2.5 % EX CREA: TOPICAL_CREAM | Freq: Two times a day (BID) | CUTANEOUS | 2 refills | Status: DC

## 2020-11-09 NOTE — Progress Notes (Signed)
   PRENATAL VISIT NOTE  Subjective:  Megan [name] is a 30 y.o. G3P2002 at [redacted]w[redacted]d being seen today for ongoing prenatal care.  She is currently monitored for the following issues for this high-risk pregnancy and has Cervical radiculopathy at C6; Allergic rhinitis; Polycystic ovaries; Vitamin D insufficiency; Varicose vein of leg; Depression; Chronic bilateral low back pain without sciatica; Elevated rheumatoid factor; Supervision of high risk pregnancy, antepartum; Language barrier affecting health care; Obesity affecting pregnancy; COVID-19 affecting pregnancy in second trimester; GDM (gestational diabetes mellitus); and Obesity (BMI 30-39.9) on their problem list.  Patient reports hemorrhoids.  Contractions: Not present. Vag. Bleeding: None.  Movement: Present. Denies leaking of fluid.   Fasting: 74-102 (3 above) Bfast 72-156  (only 4 above goal) Lunch 78-135 (only 3 above goal) Dinner [phone] (only 1 above goal)  The following portions of the patient's history were reviewed and updated as appropriate: allergies, current medications, past family history, past medical history, past social history, past surgical history and problem list.   Objective:   Vitals:   11/09/20 1009  BP: 126/84  Pulse: 98  Weight: 197 lb 11.2 oz (89.7 kg)    Fetal Status: Fetal Heart Rate (bpm): 148 Fundal Height: 34 cm Movement: Present     General:  Alert, oriented and cooperative. Patient is in no acute distress.  Skin: Skin is warm and dry. No rash noted.   Cardiovascular: Normal heart rate noted  Respiratory: Normal respiratory effort, no problems with respiration noted  Abdomen: Soft, gravid, appropriate for gestational age.  Pain/Pressure: Absent     Pelvic: Cervical exam deferred        Extremities: Normal range of motion.  Edema: None  Mental Status: Normal mood and affect. Normal behavior. Normal judgment and thought content.   Assessment and Plan:  Pregnancy: G3P2002 at [redacted]w[redacted]d 1. Supervision  of high risk pregnancy, antepartum Up to date Given letter for partner's work to support him being out of work for delivery.   2. Diet controlled gestational diabetes mellitus (GDM) in third trimester Good control Encouraged her dietary compliance  Reviewed continuing to check sugars regularly. Most important being fasting [place] scheduled on 4/22  3. Obesity affecting pregnancy in third trimester TWG=25 lb 11.2 oz (11.7 kg) which is above goal  Preterm labor symptoms and general obstetric precautions including but not limited to vaginal bleeding, contractions, leaking of fluid and fetal movement were reviewed in detail with the patient. Please refer to After Visit Summary for other counseling recommendations.   Return in about 2 weeks (around 11/23/2020) for Routine prenatal care, MD only, BG log check.  Future Appointments  Date Time Provider Department Center  11/12/2020  9:15 AM [facility]  11/13/2020  9:00 AM [facility] NURSE [facility]  11/13/2020  9:15 AM [facility]  11/23/2020  9:55 AM Alysia Penna, Marolyn Hammock, MD [facility]    Federico Flake, MD

## 2020-11-12 ENCOUNTER — Ambulatory Visit: Payer: Medicaid Other | Admitting: Registered"

## 2020-11-12 ENCOUNTER — Encounter: Payer: Medicaid Other | Attending: Obstetrics and Gynecology | Admitting: Registered"

## 2020-11-12 ENCOUNTER — Other Ambulatory Visit: Payer: Self-pay

## 2020-11-12 DIAGNOSIS — O24419 Gestational diabetes mellitus in pregnancy, unspecified control: Secondary | ICD-10-CM | POA: Diagnosis not present

## 2020-11-12 DIAGNOSIS — O99210 Obesity complicating pregnancy, unspecified trimester: Secondary | ICD-10-CM | POA: Diagnosis not present

## 2020-11-12 NOTE — Progress Notes (Signed)
Interpreter services provided by [employer] [phone] from [employer] video  Patient was seen on 11/12/20 for Gestational Diabetes self-management. EDD 12/23/20; [redacted]w[redacted]d . Patient states no history of GDM. Diet history obtained. Patient eats variety of all food groups. Beverages include 4-5 bottles of water, 12 oz juice, 1 c milk. Pt states because she saw her blood sugar go above normal when eating cakes and sweets, she tries to avoid those foods.   Patient is not walking at this time, states her legs feel heavy, but states she can try walking with a goal of 20 min per day.   The following learning objectives were met by the patient :   States the definition of Gestational Diabetes  States why dietary management is important in controlling blood glucose  Describes the effects of carbohydrates on blood glucose levels  Demonstrates ability to create a balanced meal plan  States when to check blood glucose levels  Demonstrates proper blood glucose monitoring techniques (was taught my staff prior to this appointment)  States the effect of stress and exercise on blood glucose levels  States the importance of limiting caffeine and abstaining from alcohol and smoking  States postprandial considerations including breastfeeding to reduce risk of developing Type 2 diabetes  Plan:   If OK with your MD, consider  increasing your activity level by walking, Arm Chair Exercises or other activity daily as tolerated Continue checking Blood Glucose before breakfast and 2 hours after first bite of breakfast, lunch and dinner as directed by MD  Bring Log Book/Sheet and meter to every medical appointment  Take medication if directed by MD  Patient already has a meter, is testing pre breakfast and 2 hours after each meal. Most values WNL   Patient instructed to monitor glucose levels: FBS: 60 - 95 mg/dl 2 hour: <120 mg/dl  Patient received the following handouts:  MyPlate for diabetes in [place] No handouts  available in [place]  Patient will be seen for follow-up as needed.

## 2020-11-13 ENCOUNTER — Ambulatory Visit: Payer: Medicaid Other | Admitting: *Deleted

## 2020-11-13 ENCOUNTER — Ambulatory Visit: Payer: Medicaid Other | Attending: Obstetrics and Gynecology

## 2020-11-13 ENCOUNTER — Other Ambulatory Visit: Payer: Self-pay | Admitting: *Deleted

## 2020-11-13 ENCOUNTER — Encounter: Payer: Self-pay | Admitting: *Deleted

## 2020-11-13 DIAGNOSIS — O4440 Low lying placenta NOS or without hemorrhage, unspecified trimester: Secondary | ICD-10-CM | POA: Diagnosis present

## 2020-11-13 DIAGNOSIS — O2690 Pregnancy related conditions, unspecified, unspecified trimester: Secondary | ICD-10-CM | POA: Insufficient documentation

## 2020-11-13 DIAGNOSIS — Z3A34 34 weeks gestation of pregnancy: Secondary | ICD-10-CM

## 2020-11-13 DIAGNOSIS — I519 Heart disease, unspecified: Secondary | ICD-10-CM | POA: Diagnosis not present

## 2020-11-13 DIAGNOSIS — O24410 Gestational diabetes mellitus in pregnancy, diet controlled: Secondary | ICD-10-CM

## 2020-11-13 DIAGNOSIS — Z362 Encounter for other antenatal screening follow-up: Secondary | ICD-10-CM | POA: Diagnosis not present

## 2020-11-13 DIAGNOSIS — O99213 Obesity complicating pregnancy, third trimester: Secondary | ICD-10-CM | POA: Insufficient documentation

## 2020-11-13 DIAGNOSIS — O0990 Supervision of high risk pregnancy, unspecified, unspecified trimester: Secondary | ICD-10-CM | POA: Insufficient documentation

## 2020-11-13 DIAGNOSIS — M058A Other rheumatoid arthritis with rheumatoid factor of other specified site: Secondary | ICD-10-CM

## 2020-11-13 DIAGNOSIS — O99891 Other specified diseases and conditions complicating pregnancy: Secondary | ICD-10-CM

## 2020-11-13 DIAGNOSIS — O99413 Diseases of the circulatory system complicating pregnancy, third trimester: Secondary | ICD-10-CM | POA: Insufficient documentation

## 2020-11-13 DIAGNOSIS — Z8616 Personal history of COVID-19: Secondary | ICD-10-CM

## 2020-11-16 ENCOUNTER — Other Ambulatory Visit: Payer: Self-pay

## 2020-11-23 ENCOUNTER — Encounter: Payer: Medicaid Other | Admitting: Obstetrics and Gynecology

## 2020-12-03 ENCOUNTER — Other Ambulatory Visit: Payer: Self-pay

## 2020-12-03 ENCOUNTER — Telehealth: Payer: Self-pay

## 2020-12-03 ENCOUNTER — Ambulatory Visit (INDEPENDENT_AMBULATORY_CARE_PROVIDER_SITE_OTHER): Payer: Medicaid Other | Admitting: Obstetrics and Gynecology

## 2020-12-03 ENCOUNTER — Other Ambulatory Visit (HOSPITAL_COMMUNITY)
Admission: RE | Admit: 2020-12-03 | Discharge: 2020-12-03 | Disposition: A | Discharge status: Home / Self Care | Payer: Medicaid Other | Source: Ambulatory Visit | Attending: Obstetrics and Gynecology | Admitting: Obstetrics and Gynecology

## 2020-12-03 VITALS — BP 111/69 | HR 101 | Wt 193.8 lb

## 2020-12-03 DIAGNOSIS — Z3A37 37 weeks gestation of pregnancy: Secondary | ICD-10-CM

## 2020-12-03 DIAGNOSIS — O0990 Supervision of high risk pregnancy, unspecified, unspecified trimester: Secondary | ICD-10-CM | POA: Diagnosis present

## 2020-12-03 DIAGNOSIS — Z789 Other specified health status: Secondary | ICD-10-CM

## 2020-12-03 DIAGNOSIS — O24410 Gestational diabetes mellitus in pregnancy, diet controlled: Secondary | ICD-10-CM

## 2020-12-03 NOTE — Telephone Encounter (Signed)
Called Pt using [place] [employer] Interpreter Onalee Hua id # [phone] to advise of Accu-chek supplies are ready at her Pharmacy for pickup & also went over her Induction date. Pt verbalized understanding.

## 2020-12-03 NOTE — Progress Notes (Signed)
   PRENATAL VISIT NOTE  Subjective:  Megan [name] is a 30 y.o. G3P2002 at [redacted]w[redacted]d being seen today for ongoing prenatal care.  She is currently monitored for the following issues for this high-risk pregnancy and has Cervical radiculopathy at C6; Allergic rhinitis; Polycystic ovaries; Vitamin D insufficiency; Varicose vein of leg; Depression; Chronic bilateral low back pain without sciatica; Elevated rheumatoid factor; Supervision of high risk pregnancy, antepartum; Language barrier affecting health care; Obesity affecting pregnancy; COVID-19 affecting pregnancy in second trimester; GDM (gestational diabetes mellitus); and Obesity (BMI 30-39.9) on their problem list.  Patient reports no complaints.  Contractions: Not present. Vag. Bleeding: None.  Movement: Present. Denies leaking of fluid.   The following portions of the patient's history were reviewed and updated as appropriate: allergies, current medications, past family history, past medical history, past social history, past surgical history and problem list.   Objective:   Vitals:   12/03/20 1334  BP: 111/69  Pulse: (!) 101  Weight: 193 lb 12.8 oz (87.9 kg)    Fetal Status: Fetal Heart Rate (bpm): 142 Fundal Height: 37 cm Movement: Present  Presentation: Vertex  General:  Alert, oriented and cooperative. Patient is in no acute distress.  Skin: Skin is warm and dry. No rash noted.   Cardiovascular: Normal heart rate noted  Respiratory: Normal respiratory effort, no problems with respiration noted  Abdomen: Soft, gravid, appropriate for gestational age.  Pain/Pressure: Absent     Pelvic: Cervical exam deferred Dilation: 1 Effacement (%): 0 Station: Ballotable  Extremities: Normal range of motion.  Edema: None  Mental Status: Normal mood and affect. Normal behavior. Normal judgment and thought content.   Assessment and Plan:  Pregnancy: G3P2002 at [redacted]w[redacted]d 1. Supervision of high risk pregnancy, antepartum Routine care. Pt set up for  38/6-39wk IOL today. RN to trouble shoot with pharmacy re: getting more testing supplies and constipation medications.  - GC/Chlamydia probe amp ([place])not at [facility] - Culture, beta strep (group b only)  2. [redacted] weeks gestation of pregnancy  3. Diet controlled gestational diabetes mellitus (GDM), antepartum Patient didn't bring log book but states that she has a few in the high 90s for fasting and 120s-130s for 2h post prandials but she states that she believes they are less than 1 in 4 values. I told her that if 1/4 are high then would recommend medications.  Has surveillance growth u/s on 5/20  Patient unsure about weights of prior babies but states this one feels about the same size as the other ones and no issues with their deliveries  4. Language barrier affecting health care Interpreter used  Term labor symptoms and general obstetric precautions including but not limited to vaginal bleeding, contractions, leaking of fluid and fetal movement were reviewed in detail with the patient. Please refer to After Visit Summary for other counseling recommendations.   Return in about 8 days (around 12/11/2020) for in person, md visit, high risk.  Future Appointments  Date Time Provider Department Center  12/11/2020  9:30 AM [facility] NURSE [facility]  12/11/2020  9:45 AM [facility] US4 [facility]    Stanwood Bing, MD

## 2020-12-04 ENCOUNTER — Telehealth (HOSPITAL_COMMUNITY): Payer: Self-pay | Admitting: *Deleted

## 2020-12-04 ENCOUNTER — Encounter (HOSPITAL_COMMUNITY): Payer: Self-pay | Admitting: *Deleted

## 2020-12-04 LAB — GC/CHLAMYDIA PROBE AMP (~~LOC~~) NOT AT ARMC
Chlamydia: NEGATIVE
Comment: NEGATIVE
Comment: NORMAL
Neisseria Gonorrhea: NEGATIVE

## 2020-12-04 NOTE — Telephone Encounter (Signed)
Preadmission screen Interpreter number [phone]

## 2020-12-06 LAB — CULTURE, BETA STREP (GROUP B ONLY): Strep Gp B Culture: NEGATIVE

## 2020-12-09 ENCOUNTER — Other Ambulatory Visit: Payer: Self-pay | Admitting: Advanced Practice Midwife

## 2020-12-11 ENCOUNTER — Encounter: Payer: Self-pay | Admitting: Family Medicine

## 2020-12-11 ENCOUNTER — Ambulatory Visit: Payer: Medicaid Other | Attending: Maternal & Fetal Medicine | Admitting: Maternal & Fetal Medicine

## 2020-12-11 ENCOUNTER — Other Ambulatory Visit: Payer: Self-pay | Admitting: Obstetrics

## 2020-12-11 ENCOUNTER — Ambulatory Visit: Payer: Medicaid Other | Attending: Obstetrics

## 2020-12-11 ENCOUNTER — Ambulatory Visit: Payer: Medicaid Other | Admitting: *Deleted

## 2020-12-11 ENCOUNTER — Ambulatory Visit (INDEPENDENT_AMBULATORY_CARE_PROVIDER_SITE_OTHER): Payer: Medicaid Other | Admitting: Family Medicine

## 2020-12-11 ENCOUNTER — Other Ambulatory Visit: Payer: Self-pay

## 2020-12-11 ENCOUNTER — Encounter: Payer: Self-pay | Admitting: *Deleted

## 2020-12-11 VITALS — BP 121/72 | HR 104 | Wt 196.6 lb

## 2020-12-11 DIAGNOSIS — O24410 Gestational diabetes mellitus in pregnancy, diet controlled: Secondary | ICD-10-CM

## 2020-12-11 DIAGNOSIS — O99213 Obesity complicating pregnancy, third trimester: Secondary | ICD-10-CM

## 2020-12-11 DIAGNOSIS — Z8616 Personal history of COVID-19: Secondary | ICD-10-CM

## 2020-12-11 DIAGNOSIS — O0990 Supervision of high risk pregnancy, unspecified, unspecified trimester: Secondary | ICD-10-CM

## 2020-12-11 DIAGNOSIS — M059 Rheumatoid arthritis with rheumatoid factor, unspecified: Secondary | ICD-10-CM

## 2020-12-11 DIAGNOSIS — O403XX0 Polyhydramnios, third trimester, not applicable or unspecified: Secondary | ICD-10-CM

## 2020-12-11 DIAGNOSIS — O99413 Diseases of the circulatory system complicating pregnancy, third trimester: Secondary | ICD-10-CM | POA: Diagnosis not present

## 2020-12-11 DIAGNOSIS — Z789 Other specified health status: Secondary | ICD-10-CM

## 2020-12-11 DIAGNOSIS — Z362 Encounter for other antenatal screening follow-up: Secondary | ICD-10-CM

## 2020-12-11 DIAGNOSIS — O99891 Other specified diseases and conditions complicating pregnancy: Secondary | ICD-10-CM

## 2020-12-11 DIAGNOSIS — Z758 Other problems related to medical facilities and other health care: Secondary | ICD-10-CM

## 2020-12-11 DIAGNOSIS — K5901 Slow transit constipation: Secondary | ICD-10-CM

## 2020-12-11 DIAGNOSIS — Z3A38 38 weeks gestation of pregnancy: Secondary | ICD-10-CM

## 2020-12-11 DIAGNOSIS — I519 Heart disease, unspecified: Secondary | ICD-10-CM

## 2020-12-11 DIAGNOSIS — K649 Unspecified hemorrhoids: Secondary | ICD-10-CM

## 2020-12-11 MED ORDER — DOCUSATE SODIUM 100 MG PO CAPS
100.0000 mg | ORAL_CAPSULE | Freq: Two times a day (BID) | ORAL | 5 refills | Status: AC
  Filled 2020-12-11: qty 60, 30d supply, fill #0

## 2020-12-11 MED ORDER — HYDROCORTISONE (PERIANAL) 2.5 % EX CREA
TOPICAL_CREAM | Freq: Two times a day (BID) | CUTANEOUS | 2 refills | Status: DC
  Filled 2020-12-11: qty 30, 14d supply, fill #0

## 2020-12-11 MED ORDER — POLYETHYLENE GLYCOL 3350 17 GM/SCOOP PO POWD
17.0000 g | Freq: Every day | ORAL | 0 refills | Status: DC
  Filled 2020-12-11: qty 238, 14d supply, fill #0

## 2020-12-11 NOTE — Progress Notes (Signed)
MFM Brief Note  Megan [name] is a 30 yo G3P2 who is here for follow up growth given the diagnosis of A1GDM  Follow up growth due to A1GDM Normal interval growth with measurements consistent with dates Good fetal movement and New polyhydramnios today. Biphysical profile 8/8  I reviewed with [name] today's examination and the new diagnosis of polyhydramnios.  I discussed with [name]  the diagnosis of polyhydramnios defined as the MVP > 8 or AFI >24 cm. We discussed the common etiologies include idiopathic, aneupolidy, gastrointestinal conditions, cranial facial defects, and neuromuscular conditions. She has known A1GDM with plan for delivery on 05/25.  I agreed with this plan. In addition, I discussed daily kick counts as well.  If she is not induced by 5/25 please consider antenatal testing in NST/AFI  I spent 30 minutes with >50% in face to face consultation.  All questions answered  Novella Olive, MD

## 2020-12-11 NOTE — Progress Notes (Signed)
   PRENATAL VISIT NOTE  Subjective:  Megan [name] is a 30 y.o. G3P2002 at [redacted]w[redacted]d being seen today for ongoing prenatal care.  She is currently monitored for the following issues for this high-risk pregnancy and has Cervical radiculopathy at C6; Allergic rhinitis; Polycystic ovaries; Vitamin D insufficiency; Varicose vein of leg; Depression; Chronic bilateral low back pain without sciatica; Elevated rheumatoid factor; Supervision of high risk pregnancy, antepartum; Language barrier affecting health care; Obesity affecting pregnancy; COVID-19 affecting pregnancy in second trimester; GDM (gestational diabetes mellitus); and Obesity (BMI 30-39.9) on their problem list.  Patient reports constipation and painful hemorrhoid.  Contractions: Not present. Vag. Bleeding: None.  Movement: Present. Denies leaking of fluid.   The following portions of the patient's history were reviewed and updated as appropriate: allergies, current medications, past family history, past medical history, past social history, past surgical history and problem list.   Objective:   Vitals:   12/11/20 0906  BP: 121/72  Pulse: (!) 104  Weight: 196 lb 9.6 oz (89.2 kg)    Fetal Status: Fetal Heart Rate (bpm): 146   Movement: Present     General:  Alert, oriented and cooperative. Patient is in no acute distress.  Skin: Skin is warm and dry. No rash noted.   Cardiovascular: Normal heart rate noted  Respiratory: Normal respiratory effort, no problems with respiration noted  Abdomen: Soft, gravid, appropriate for gestational age.  Pain/Pressure: Absent     Pelvic: Cervical exam deferred        Extremities: Normal range of motion.  Edema: None  Mental Status: Normal mood and affect. Normal behavior. Normal judgment and thought content.   Assessment and Plan:  Pregnancy: G3P2002 at [redacted]w[redacted]d 1. Obesity affecting pregnancy in third trimester   2. Supervision of high risk pregnancy, antepartum GBS negative.  3. Language  barrier affecting health care Video Burmese interpreter: Mary used  4. Diet controlled gestational diabetes mellitus (GDM) in third trimester See below, most fastings out, 1/3 pp out, for iOL at 39 wks. U/S for growth today       5. Slow transit constipation Add Miralax - docusate sodium (COLACE) 100 MG capsule; Take 1 capsule (100 mg total) by mouth 2 (two) times daily.  Dispense: 60 capsule; Refill: 5 - polyethylene glycol (MIRALAX) 17 g packet; Take 17 g by mouth daily.  Dispense: 14 each; Refill: 0  6. Hemorrhoids, unspecified hemorrhoid type May need general surgeon post delivery - hydrocortisone (ANUSOL-HC) 2.5 % rectal cream; Place rectally 2 (two) times daily.  Dispense: 30 g; Refill: 2  Term labor symptoms and general obstetric precautions including but not limited to vaginal bleeding, contractions, leaking of fluid and fetal movement were reviewed in detail with the patient. Please refer to After Visit Summary for other counseling recommendations.   Return in 4 weeks (on 01/08/2021) for pp check with 2 hour.  Future Appointments  Date Time Provider Department Center  12/11/2020  9:45 AM [facility] US4 [facility]  12/14/2020  9:50 AM [facility]ENING [facility] None  12/16/2020  9:00 AM [facility] SCHED ROOM [facility] None    Reva Bores, MD

## 2020-12-11 NOTE — Patient Instructions (Signed)

## 2020-12-13 ENCOUNTER — Inpatient Hospital Stay (HOSPITAL_COMMUNITY): Payer: Medicaid Other | Admitting: Anesthesiology

## 2020-12-13 ENCOUNTER — Other Ambulatory Visit: Payer: Self-pay

## 2020-12-13 ENCOUNTER — Inpatient Hospital Stay (HOSPITAL_COMMUNITY)
Admission: AD | Admit: 2020-12-13 | Discharge: 2020-12-15 | DRG: 807 | Disposition: A | Discharge status: Home / Self Care | Payer: Medicaid Other | Attending: Obstetrics and Gynecology | Admitting: Obstetrics and Gynecology

## 2020-12-13 ENCOUNTER — Encounter (HOSPITAL_COMMUNITY): Payer: Self-pay | Admitting: Family Medicine

## 2020-12-13 DIAGNOSIS — O24420 Gestational diabetes mellitus in childbirth, diet controlled: Principal | ICD-10-CM | POA: Diagnosis present

## 2020-12-13 DIAGNOSIS — O26893 Other specified pregnancy related conditions, third trimester: Secondary | ICD-10-CM | POA: Diagnosis present

## 2020-12-13 DIAGNOSIS — O0990 Supervision of high risk pregnancy, unspecified, unspecified trimester: Secondary | ICD-10-CM

## 2020-12-13 DIAGNOSIS — Z3A38 38 weeks gestation of pregnancy: Secondary | ICD-10-CM

## 2020-12-13 DIAGNOSIS — O24419 Gestational diabetes mellitus in pregnancy, unspecified control: Secondary | ICD-10-CM | POA: Diagnosis present

## 2020-12-13 DIAGNOSIS — U071 COVID-19: Secondary | ICD-10-CM | POA: Diagnosis present

## 2020-12-13 DIAGNOSIS — O99210 Obesity complicating pregnancy, unspecified trimester: Secondary | ICD-10-CM | POA: Diagnosis present

## 2020-12-13 DIAGNOSIS — O99214 Obesity complicating childbirth: Secondary | ICD-10-CM | POA: Diagnosis present

## 2020-12-13 DIAGNOSIS — O98512 Other viral diseases complicating pregnancy, second trimester: Secondary | ICD-10-CM | POA: Diagnosis present

## 2020-12-13 DIAGNOSIS — Z8616 Personal history of COVID-19: Secondary | ICD-10-CM | POA: Diagnosis not present

## 2020-12-13 DIAGNOSIS — O4202 Full-term premature rupture of membranes, onset of labor within 24 hours of rupture: Secondary | ICD-10-CM

## 2020-12-13 LAB — CBC
HCT: 39.9 % (ref 36.0–46.0)
Hemoglobin: 13 g/dL (ref 12.0–15.0)
MCH: 28 pg (ref 26.0–34.0)
MCHC: 32.6 g/dL (ref 30.0–36.0)
MCV: 86 fL (ref 80.0–100.0)
Platelets: 278 10*3/uL (ref 150–400)
RBC: 4.64 MIL/uL (ref 3.87–5.11)
RDW: 14.9 % (ref 11.5–15.5)
WBC: 12.5 10*3/uL — ABNORMAL HIGH (ref 4.0–10.5)
nRBC: 0 % (ref 0.0–0.2)

## 2020-12-13 LAB — RESP PANEL BY RT-PCR (FLU A&B, COVID) ARPGX2
Influenza A by PCR: NEGATIVE
Influenza B by PCR: NEGATIVE
SARS Coronavirus 2 by RT PCR: NEGATIVE

## 2020-12-13 LAB — TYPE AND SCREEN
ABO/RH(D): O POS
Antibody Screen: NEGATIVE

## 2020-12-13 LAB — GLUCOSE, CAPILLARY: Glucose-Capillary: 107 mg/dL — ABNORMAL HIGH (ref 70–99)

## 2020-12-13 LAB — RPR: RPR Ser Ql: NONREACTIVE

## 2020-12-13 MED ORDER — EPHEDRINE 5 MG/ML INJ: 10.0000 mg | INTRAVENOUS | Status: DC | PRN

## 2020-12-13 MED ORDER — WITCH HAZEL-GLYCERIN EX PADS: 1.0000 "application " | MEDICATED_PAD | CUTANEOUS | Status: DC | PRN

## 2020-12-13 MED ORDER — COCONUT OIL OIL: 1.0000 "application " | TOPICAL_OIL | Status: DC | PRN

## 2020-12-13 MED ORDER — LACTATED RINGERS IV SOLN
500.0000 mL | Freq: Once | INTRAVENOUS | Status: AC
  Administered 2020-12-13: 500 mL via INTRAVENOUS

## 2020-12-13 MED ORDER — DIPHENHYDRAMINE HCL 25 MG PO CAPS: 25.0000 mg | ORAL_CAPSULE | Freq: Four times a day (QID) | ORAL | Status: DC | PRN

## 2020-12-13 MED ORDER — ONDANSETRON HCL 4 MG PO TABS: 4.0000 mg | ORAL_TABLET | ORAL | Status: DC | PRN

## 2020-12-13 MED ORDER — IBUPROFEN 600 MG PO TABS
600.0000 mg | ORAL_TABLET | Freq: Four times a day (QID) | ORAL | Status: DC
  Administered 2020-12-13 – 2020-12-15 (×10): 600 mg via ORAL
  Filled 2020-12-13 (×10): qty 1

## 2020-12-13 MED ORDER — LIDOCAINE HCL (PF) 1 % IJ SOLN: 30.0000 mL | INTRAMUSCULAR | Status: DC | PRN

## 2020-12-13 MED ORDER — PHENYLEPHRINE 40 MCG/ML (10ML) SYRINGE FOR IV PUSH (FOR BLOOD PRESSURE SUPPORT): 80.0000 ug | PREFILLED_SYRINGE | INTRAVENOUS | Status: DC | PRN

## 2020-12-13 MED ORDER — OXYTOCIN BOLUS FROM INFUSION
333.0000 mL | Freq: Once | INTRAVENOUS | Status: AC
  Administered 2020-12-13: 333 mL via INTRAVENOUS

## 2020-12-13 MED ORDER — FENTANYL-BUPIVACAINE-NACL 0.5-0.125-0.9 MG/250ML-% EP SOLN
EPIDURAL | Status: DC | PRN
  Administered 2020-12-13: 12 mL/h via EPIDURAL

## 2020-12-13 MED ORDER — LIDOCAINE HCL (PF) 1 % IJ SOLN
INTRAMUSCULAR | Status: DC | PRN
  Administered 2020-12-13: 10 mL via EPIDURAL
  Administered 2020-12-13: 2 mL via EPIDURAL

## 2020-12-13 MED ORDER — MISOPROSTOL 25 MCG QUARTER TABLET: 25.0000 ug | ORAL_TABLET | ORAL | Status: DC | PRN

## 2020-12-13 MED ORDER — OXYTOCIN-SODIUM CHLORIDE 30-0.9 UT/500ML-% IV SOLN
2.5000 [IU]/h | INTRAVENOUS | Status: DC
  Filled 2020-12-13: qty 500

## 2020-12-13 MED ORDER — SENNOSIDES-DOCUSATE SODIUM 8.6-50 MG PO TABS
2.0000 | ORAL_TABLET | ORAL | Status: DC
  Administered 2020-12-13 – 2020-12-15 (×3): 2 via ORAL
  Filled 2020-12-13 (×3): qty 2

## 2020-12-13 MED ORDER — PRENATAL MULTIVITAMIN CH
1.0000 | ORAL_TABLET | Freq: Every day | ORAL | Status: DC
  Administered 2020-12-13 – 2020-12-15 (×3): 1 via ORAL
  Filled 2020-12-13 (×3): qty 1

## 2020-12-13 MED ORDER — FENTANYL-BUPIVACAINE-NACL 0.5-0.125-0.9 MG/250ML-% EP SOLN
12.0000 mL/h | EPIDURAL | Status: DC | PRN
  Filled 2020-12-13: qty 250

## 2020-12-13 MED ORDER — ACETAMINOPHEN 325 MG PO TABS
650.0000 mg | ORAL_TABLET | ORAL | Status: DC | PRN
  Administered 2020-12-13: 650 mg via ORAL
  Filled 2020-12-13 (×2): qty 2

## 2020-12-13 MED ORDER — OXYCODONE-ACETAMINOPHEN 5-325 MG PO TABS: 2.0000 | ORAL_TABLET | ORAL | Status: DC | PRN

## 2020-12-13 MED ORDER — ONDANSETRON HCL 4 MG/2ML IJ SOLN: 4.0000 mg | INTRAMUSCULAR | Status: DC | PRN

## 2020-12-13 MED ORDER — TETANUS-DIPHTH-ACELL PERTUSSIS 5-2.5-18.5 LF-MCG/0.5 IM SUSY: 0.5000 mL | PREFILLED_SYRINGE | Freq: Once | INTRAMUSCULAR | Status: DC

## 2020-12-13 MED ORDER — ONDANSETRON HCL 4 MG/2ML IJ SOLN: 4.0000 mg | Freq: Four times a day (QID) | INTRAMUSCULAR | Status: DC | PRN

## 2020-12-13 MED ORDER — BENZOCAINE-MENTHOL 20-0.5 % EX AERO: 1.0000 "application " | INHALATION_SPRAY | CUTANEOUS | Status: DC | PRN

## 2020-12-13 MED ORDER — OXYCODONE-ACETAMINOPHEN 5-325 MG PO TABS: 1.0000 | ORAL_TABLET | ORAL | Status: DC | PRN

## 2020-12-13 MED ORDER — FENTANYL CITRATE (PF) 100 MCG/2ML IJ SOLN
100.0000 ug | INTRAMUSCULAR | Status: DC | PRN
  Administered 2020-12-13: 100 ug via INTRAVENOUS
  Filled 2020-12-13: qty 2

## 2020-12-13 MED ORDER — SIMETHICONE 80 MG PO CHEW
80.0000 mg | CHEWABLE_TABLET | ORAL | Status: DC | PRN
  Filled 2020-12-13: qty 1

## 2020-12-13 MED ORDER — LACTATED RINGERS IV SOLN: INTRAVENOUS | Status: DC

## 2020-12-13 MED ORDER — LACTATED RINGERS IV SOLN
500.0000 mL | INTRAVENOUS | Status: DC | PRN
  Administered 2020-12-13: 1000 mL via INTRAVENOUS

## 2020-12-13 MED ORDER — TERBUTALINE SULFATE 1 MG/ML IJ SOLN
0.2500 mg | Freq: Once | INTRAMUSCULAR | Status: DC | PRN
Start: 2020-12-13 — End: 2020-12-13

## 2020-12-13 MED ORDER — DIBUCAINE (PERIANAL) 1 % EX OINT: 1.0000 "application " | TOPICAL_OINTMENT | CUTANEOUS | Status: DC | PRN

## 2020-12-13 MED ORDER — ACETAMINOPHEN 325 MG PO TABS: 650.0000 mg | ORAL_TABLET | ORAL | Status: DC | PRN

## 2020-12-13 MED ORDER — SOD CITRATE-CITRIC ACID 500-334 MG/5ML PO SOLN: 30.0000 mL | ORAL | Status: DC | PRN

## 2020-12-13 MED ORDER — DIPHENHYDRAMINE HCL 50 MG/ML IJ SOLN: 12.5000 mg | INTRAMUSCULAR | Status: DC | PRN

## 2020-12-13 MED ORDER — DOCUSATE SODIUM 100 MG PO CAPS
100.0000 mg | ORAL_CAPSULE | Freq: Two times a day (BID) | ORAL | Status: DC
  Administered 2020-12-14 – 2020-12-15 (×4): 100 mg via ORAL
  Filled 2020-12-13 (×4): qty 1

## 2020-12-13 NOTE — H&P (Signed)
OBSTETRIC ADMISSION HISTORY AND PHYSICAL  Megan [name] is a 30 y.o. female G3P2002 with IUP at [redacted]w[redacted]d by 19 week ultrasound presenting for spontaneous onset of labor s/p SROM at approximately 0400 on 5/22 prior to arrival. She reports +FMs, no VB, no blurry vision, headaches or peripheral edema, and RUQ pain.  She plans on breast and formula feeding. She desires POPs for birth control. She received her prenatal care at [facility]   Dating: By 19 week ultrasound --->  Estimated Date of Delivery: 12/23/20  Sono:  @[redacted]w[redacted]d , CWD, normal anatomy, cephalic presentation, 3388g, EFW  Prenatal History/Complications:  - A1GDM - Low lying placenta (resolved) - Elevated Rheumatoid factor  Past Medical History: Past Medical History:  Diagnosis Date  . COVID-19 affecting pregnancy in second trimester 10/22/2020   08/01/20   . Irregular menstrual cycle 03/02/2016  . Medical history non-contributory   . PAC (premature atrial contraction) 06/27/2014  . Polycystic ovaries 07/24/2015    Past Surgical History: Past Surgical History:  Procedure Laterality Date  . NO PAST SURGERIES      Obstetrical History: OB History    Gravida  3   Para  2   Term  2   Preterm      AB      Living  2     SAB      IAB      Ectopic      Multiple      Live Births  2           Social History Social History   Socioeconomic History  . Marital status: Married    Spouse name: Not on file  . Number of children: 2  . Years of education: Not on file  . Highest education level: Not on file  Occupational History  . Not on file  Tobacco Use  . Smoking status: Never Smoker  . Smokeless tobacco: Never Used  Vaping Use  . Vaping Use: Never used  Substance and Sexual Activity  . Alcohol use: No  . Drug use: No  . Sexual activity: Yes    Birth control/protection: None  Other Topics Concern  . Not on file  Social History Narrative   Nepalese   2 young boys    Social Determinants of Health    Financial Resource Strain: Not on file  Food Insecurity: No Food Insecurity  . Worried About 07/26/2015 in the Last Year: Never true  . Ran Out of Food in the Last Year: Never true  Transportation Needs: No Transportation Needs  . Lack of Transportation (Medical): No  . Lack of Transportation (Non-Medical): No  Physical Activity: Not on file  Stress: Not on file  Social Connections: Not on file    Family History: Family History  Problem Relation Age of Onset  . Diabetes Mother   . Hypertension Mother     Allergies: No Known Allergies  Medications Prior to Admission  Medication Sig Dispense Refill Last Dose  . Accu-Chek Softclix Lancets lancets Use as instructed. 100 each 5   . aspirin EC 81 MG tablet Take 1 tablet (81 mg total) by mouth daily. Take after 12 weeks for prevention of preeclampsia later in pregnancy 300 tablet 2   . docusate sodium (COLACE) 100 MG capsule Take 1 capsule (100 mg total) by mouth 2 (two) times daily. 60 capsule 5   . glucose blood (ACCU-CHEK GUIDE) test strip Use as instructed 100 each 5   . hydrocortisone (ANUSOL-HC)  2.5 % rectal cream Place rectally 2 (two) times daily. 30 g 2   . polyethylene glycol powder (MIRALAX) 17 GM/SCOOP powder Take 17 g by mouth daily. 238 g 0   . Prenatal Vit-Fe Fumarate-FA (MULTIVITAMIN-PRENATAL) 27-0.8 MG TABS tablet Take 1 tablet by mouth daily at 12 noon. 30 tablet 1   . promethazine (PHENERGAN) 12.5 MG tablet Take 1 tablet (12.5 mg total) by mouth every 6 (six) hours as needed for nausea or vomiting. (Patient not taking: No sig reported) 30 tablet 0      Review of Systems   All systems reviewed and negative except as stated in HPI  Blood pressure 128/81, pulse 91, temperature 97.9 F (36.6 C), resp. rate (!) 22, height 5' (1.524 m), weight 91.6 kg, last menstrual period 03/06/2020. General appearance: alert, cooperative and appears stated age Lungs: normal WOB Heart: regular rate  Abdomen: soft,  non-tender Extremities: no sign of DVT Presentation: cephalic Fetal monitoringBaseline: 130 bpm, Variability: Good {> 6 bpm), Accelerations: Reactive and Decelerations: Absent Uterine activityFrequency: Every 2-3 minutes Dilation: 5 Effacement (%): 80 Station: -1 Exam by:: J.Barham,RN   Prenatal labs: ABO, Rh: O/Positive/-- (11/29 1550) Antibody: Negative (11/29 1550) Rubella: 23.60 (11/29 1550) RPR: Non Reactive (03/17 0928)  HBsAg: Negative (11/29 1550)  HIV: Non Reactive (03/17 0928)  GBS: Negative/-- (05/12 1451)  2 hr Glucola 90/183/134 Genetic screening wnl Anatomy US wnl  Prenatal Transfer Tool  Maternal Diabetes: Yes:  Diabetes Type:  Diet controlled Genetic Screening: Normal Maternal Ultrasounds/Referrals: Normal Fetal Ultrasounds or other Referrals:  Referred to Materal Fetal Medicine  Maternal Substance Abuse:  No Significant Maternal Medications:  None Significant Maternal Lab Results: Group B Strep negative  No results found for this or any previous visit (from the past 24 hour(s)).  Patient Active Problem List   Diagnosis Date Noted  . Gestational diabetes 12/13/2020  . COVID-19 affecting pregnancy in second trimester 10/22/2020  . GDM (gestational diabetes mellitus) 10/22/2020  . Obesity (BMI 30-39.9) 10/22/2020  . Obesity affecting pregnancy 09/10/2020  . Language barrier affecting health care 07/29/2020  . Supervision of high risk pregnancy, antepartum 06/22/2020  . Elevated rheumatoid factor 02/13/2017  . Chronic bilateral low back pain without sciatica 10/25/2016  . Varicose vein of leg 04/08/2016  . Depression 04/08/2016  . Vitamin D insufficiency 03/03/2016  . Polycystic ovaries 07/24/2015  . Allergic rhinitis 12/08/2014  . Cervical radiculopathy at C6 06/27/2014    Assessment/Plan:  Megan [name] is a 30 y.o. G3P2002 at [redacted]w[redacted]d here for spontaneous onset of labor s/p SROM at approximately 0400 on 5/22 prior to arrival.  #Labor: Will manage  expectantly and augment as clinically indicated. #Pain: TBD per pt request #FWB: Category 1 strip #ID: GBS negative #MOF: breast & formula #MOC: POPs #Circ: desired #A1GDM: plan for BG checks every 4 hours in latent labor, then every 2 hours in active labor.  Sheila Oats, MD OB Fellow, Faculty [facility] 12/13/2020 4:54 AM

## 2020-12-13 NOTE — Progress Notes (Signed)
Interpreter used at admission #

## 2020-12-13 NOTE — Anesthesia Procedure Notes (Addendum)
Epidural Patient location during procedure: OB Start time: 12/13/2020 5:47 AM End time: 12/13/2020 5:57 AM  Staffing Anesthesiologist: Lannie Fields, DO Performed: anesthesiologist   Preanesthetic Checklist Completed: patient identified, IV checked, risks and benefits discussed, monitors and equipment checked, pre-op evaluation and timeout performed  Epidural Patient position: sitting Prep: DuraPrep and site prepped and draped Patient monitoring: continuous pulse ox, blood pressure, heart rate and cardiac monitor Approach: midline Location: L3-L4 Injection technique: LOR air  Needle:  Needle type: Tuohy  Needle gauge: 17 G Needle length: 9 cm Needle insertion depth: 7 cm Catheter type: closed end flexible Catheter size: 19 Gauge Catheter at skin depth: 12 cm Test dose: negative  Assessment Sensory level: T8 Events: blood not aspirated, injection not painful, no injection resistance, no paresthesia and negative IV test  Additional Notes Patient identified. Risks/Benefits/Options discussed with patient including but not limited to bleeding, infection, nerve damage, paralysis, failed block, incomplete pain control, headache, blood pressure changes, nausea, vomiting, reactions to medication both or allergic, itching and postpartum back pain. Confirmed with bedside nurse the patient's most recent platelet count. Confirmed with patient that they are not currently taking any anticoagulation, have any bleeding history or any family history of bleeding disorders. Patient expressed understanding and wished to proceed. All questions were answered. Sterile technique was used throughout the entire procedure. Please see nursing notes for vital signs. Test dose was given through epidural catheter and negative prior to continuing to dose epidural or start infusion. Warning signs of high block given to the patient including shortness of breath, tingling/numbness in hands, complete motor  block, or any concerning symptoms with instructions to call for help. Patient was given instructions on fall risk and not to get out of bed. All questions and concerns addressed with instructions to call with any issues or inadequate analgesia.  Reason for block:procedure for pain

## 2020-12-13 NOTE — Anesthesia Preprocedure Evaluation (Signed)
Anesthesia Evaluation  Patient identified by MRN, date of birth, ID band Patient awake    Reviewed: Allergy & Precautions, Patient's Chart, lab work & pertinent test results  Airway Mallampati: III  TM Distance: >3 FB Neck ROM: Full    Dental no notable dental hx. (+) Dental Advisory Given   Pulmonary neg pulmonary ROS,    Pulmonary exam normal breath sounds clear to auscultation       Cardiovascular negative cardio ROS Normal cardiovascular exam Rhythm:Regular Rate:Normal     Neuro/Psych PSYCHIATRIC DISORDERS Depression negative neurological ROS     GI/Hepatic negative GI ROS, Neg liver ROS,   Endo/Other  diabetes, GestationalMorbid obesityBMI 40  Renal/GU negative Renal ROS  Female GU complaint     Musculoskeletal negative musculoskeletal ROS (+)   Abdominal (+) + obese,   Peds negative pediatric ROS (+)  Hematology negative hematology ROS (+) hct 39.9, plt 278   Anesthesia Other Findings Burmese-speaking only   Reproductive/Obstetrics (+) Pregnancy                             Anesthesia Physical Anesthesia Plan  ASA: III and emergent  Anesthesia Plan: Epidural   Post-op Pain Management:    Induction:   PONV Risk Score and Plan: 2  Airway Management Planned: Natural Airway  Additional Equipment: None  Intra-op Plan:   Post-operative Plan:   Informed Consent: I have reviewed the patients History and Physical, chart, labs and discussed the procedure including the risks, benefits and alternatives for the proposed anesthesia with the patient or authorized representative who has indicated his/her understanding and acceptance.       Plan Discussed with:   Anesthesia Plan Comments:         Anesthesia Quick Evaluation

## 2020-12-13 NOTE — Lactation Note (Signed)
This note was copied from a baby's chart. Lactation Consultation Note  Patient Name: Megan [name] Date: 12/13/2020 Reason for consult: L&D Initial assessment- unable to bring [place] up on the I - pad . Dad spoke limited [place].  Age:28 mins post birth  Baby latched easily and fed 1st breast 15 mins , switched the 2nd breast for 10 mims, swallows with both latches and depth at the breast.  Mom P 3 , exp BF.  Both mom and dad seemed excited the baby fed.  Maternal Data Does the patient have breastfeeding experience prior to this delivery?: Yes How long did the patient breastfeed?: per dad 1 year for 1st baby and 2nd baby  Feeding Mother's Current Feeding Choice: Breast Milk  LATCH Score Latch: Grasps breast easily, tongue down, lips flanged, rhythmical sucking.  Audible Swallowing: A few with stimulation  Type of Nipple: Everted at rest and after stimulation  Comfort (Breast/Nipple): Soft / non-tender  Hold (Positioning): Assistance needed to correctly position infant at breast and maintain latch.  LATCH Score: 8   Lactation Tools Discussed/Used    Interventions Interventions: Breast feeding basics reviewed;Assisted with latch;Skin to skin;Breast massage;Hand express;Breast compression;Adjust position;Support pillows  Discharge    Consult Status Consult Status: Follow-up (from LD) Date: 12/13/20 Follow-up type: In-patient    Megan [name] 12/13/2020, 10:31 AM

## 2020-12-13 NOTE — Anesthesia Procedure Notes (Deleted)
Epidural

## 2020-12-13 NOTE — Discharge Summary (Addendum)
Postpartum Discharge Summary    Patient Name: Megan [name] DOB: [birth year] MRN: [id number]  Date of admission: 12/13/2020 Delivery date:12/13/2020  Delivering provider: Janet Berlin  Date of discharge: 12/15/2020  Admitting diagnosis: Gestational diabetes [O24.419] Intrauterine pregnancy: [redacted]w[redacted]d    Secondary diagnosis:  Active Problems:   Obesity affecting pregnancy   COVID-19 affecting pregnancy in second trimester   Gestational diabetes  Additional problems: as noted above  Discharge diagnosis: Term Pregnancy Delivered and GDM A1                                              Post partum procedures:none Augmentation: N/A Complications: 656Pshoulder dystocia   Hospital course: Onset of Labor With Vaginal Delivery      30y.o. yo G3P3003 at 353w4das admitted in Active Labor on 12/13/2020. Patient had an uncomplicated labor course as follows:  Membrane Rupture Time/Date: 4:00 AM ,12/13/2020   Delivery Method:Vaginal, Vacuum (Extractor)  Episiotomy: None  Lacerations:  None  Patient had an uncomplicated postpartum course.  She is ambulating, tolerating a regular diet, passing flatus, and urinating well. Patient is discharged home in stable condition on 12/15/20.  Newborn Data: Birth date:12/13/2020  Birth time:9:01 AM  Gender:Female  Living status:Living  Apgars:5 ,8  Weight:3374 g   Magnesium Sulfate received: No BMZ received: No Rhophylac:N/A MMR:N/A T-DaP:Given prenatally Flu: No Transfusion:No  Physical exam  Vitals:   12/13/20 1928 12/14/20 0033 12/14/20 0546 12/14/20 1310  BP: 112/67 109/74 111/86 118/88  Pulse: 99 95 80 89  Resp: _0 Temp: 99 F (37.2 C) 98.5 F (36.9 C) 98.1 F (36.7 C) (!) 97.5 F (36.4 C)  TempSrc: Oral Oral Oral Oral  SpO2: 98% 99% 100%   Weight:      Height:       General: alert, cooperative and no distress Lochia: appropriate Uterine Fundus: firm Incision: N/A DVT Evaluation: No evidence of DVT seen on physical  exam. No cords or calf tenderness. No significant calf/ankle edema.  Labs: Lab Results  Component Value Date   WBC 12.5 (H) 12/13/2020   HGB 13.0 12/13/2020   HCT 39.9 12/13/2020   MCV 86.0 12/13/2020   PLT 278 12/13/2020   CMP Latest Ref Rng & Units 08/20/2020  Glucose 70 - 99 mg/dL 136(H)  BUN 6 - 20 mg/dL 6  Creatinine 0.44 - 1.00 mg/dL 0.58  Sodium 135 - 145 mmol/L 137  Potassium 3.5 - 5.1 mmol/L 3.6  Chloride 98 - 111 mmol/L 106  CO2 22 - 32 mmol/L 19(L)  Calcium 8.9 - 10.3 mg/dL 9.0  Total Protein 6.5 - 8.1 g/dL 6.6  Total Bilirubin 0.3 - 1.2 mg/dL 0.3  Alkaline Phos 38 - 126 U/L 46  AST 15 - 41 U/L 21  ALT 0 - 44 U/L 11   Edinburgh Score: Edinburgh Postnatal Depression Scale Screening Tool 12/14/2020  I have been able to laugh and see the funny side of things. 1  I have looked forward with enjoyment to things. 1  I have blamed myself unnecessarily when things went wrong. 2  I have been anxious or worried for no good reason. 2  I have felt scared or panicky for no good reason. 2  Things have been getting on top of me. 1  I have been so unhappy that I have had difficulty sleeping.  1  I have felt sad or miserable. 1  I have been so unhappy that I have been crying. 1  The thought of harming myself has occurred to me. 0  Edinburgh Postnatal Depression Scale Total 12     After visit meds:  Allergies as of 12/15/2020   No Known Allergies     Medication List    STOP taking these medications   Accu-Chek Guide test strip Generic drug: glucose blood   Accu-Chek Softclix Lancets lancets   aspirin EC 81 MG tablet   promethazine 12.5 MG tablet Commonly known as: PHENERGAN     TAKE these medications   acetaminophen 325 MG tablet Commonly known as: Tylenol Take 2 tablets (650 mg total) by mouth every 6 (six) hours as needed for mild pain, moderate pain, fever or headache (for pain scale < 4).   coconut oil Oil Apply 1 application topically as needed (nipple  pain).   docusate sodium 100 MG capsule Commonly known as: COLACE Take 1 capsule (100 mg total) by mouth 2 (two) times daily.   ibuprofen 600 MG tablet Commonly known as: ADVIL Take 1 tablet (600 mg total) by mouth every 6 (six) hours.   multivitamin-prenatal 27-0.8 MG Tabs tablet Take 1 tablet by mouth daily at 12 noon.   norethindrone 0.35 MG tablet Commonly known as: Ortho Micronor Take 1 tablet (0.35 mg total) by mouth daily.   polyethylene glycol powder 17 GM/SCOOP powder Commonly known as: MiraLax Take 17 g by mouth daily.   Proctozone-HC 2.5 % rectal cream Generic drug: hydrocortisone Place rectally 2 (two) times daily.        Discharge home in stable condition Infant Feeding: Bottle and Breast Infant Disposition:home with mother Discharge instruction: per After Visit Summary and Postpartum booklet. Activity: Advance as tolerated. Pelvic rest for 6 weeks.  Diet: routine diet Future Appointments: Future Appointments  Date Time Provider Department Center  01/14/2021  8:50 AM [facility]  01/14/2021  9:35 AM Arnold, James G, MD [facility]   Follow up Visit:   Please schedule this patient for a In person postpartum visit in 4 weeks with the following provider: Any provider. Additional Postpartum F/U:2 hour GTT  High risk pregnancy complicated by: GDM Delivery mode:  Vaginal, Vacuum (Extractor)  Anticipated Birth Control:  POPs  Weisgerber, William B, MD OB Fellow, Faculty Practice 12/15/2020 1:42 PM   Attestation of Attending Supervision of Resident: Evaluation and management procedures were performed by the Family Medicine Resident under my supervision. I was immediately available for direct supervision, assistance and direction throughout this encounter.  I also confirm that I have verified the information documented in the resident's note, and that I have also personally reperformed the pertinent components of the physical exam and all of the  medical decision making activities.     , DO OB [facility] 12/15/2020  1:47 PM   

## 2020-12-13 NOTE — Lactation Note (Addendum)
This note was copied from a baby's chart. Lactation Consultation Note Baby on triple photo therapy at 14 hrs old. FOB sleeping. Spoke w/mom. Mom has limited [place]. Mom stated it's OK, not to bother FOB. Mom is BF and formula feeding. Mom stated she fed on the breast a long time, now she give the formula. Milk seemed to has to fast flow for baby. LC got purple slow flow nipple. Encouraged mom to sit baby upright when feeding formula, then burp. Mom stated it has been a long time since she had little baby. Mom has 67, and 62 yr old that she BF for 1 yr each. Hand expression demonstrated w/colostrum noted.  Encouraged to BF then give supplement. Asked mom if she would like to use hospital pump. Mom stated ok. Mom wanted to eat her dinner first. LC told mom that I would come back and set up pump.    Patient Name: Boy Atisha Hamidi VELFY'B Date: 12/13/2020 Reason for consult: Initial assessment;Early term 37-38.6wks Age:28 hours  Maternal Data Has patient been taught Hand Expression?: Yes Does the patient have breastfeeding experience prior to this delivery?: Yes How long did the patient breastfeed?: 1 yr each child  Feeding Nipple Type: Extra Slow Flow  LATCH Score Latch: Grasps breast easily, tongue down, lips flanged, rhythmical sucking.  Audible Swallowing: A few with stimulation  Type of Nipple: Everted at rest and after stimulation  Comfort (Breast/Nipple): Soft / non-tender  Hold (Positioning): Assistance needed to correctly position infant at breast and maintain latch.  LATCH Score: 8   Lactation Tools Discussed/Used    Interventions Interventions: Skin to skin;Breast massage;Hand express;Breast compression  Discharge WIC Program: No  Consult Status Consult Status: Follow-up Date: 12/14/20 Follow-up type: In-patient    Charyl Dancer 12/13/2020, 11:33 PM

## 2020-12-14 ENCOUNTER — Other Ambulatory Visit (HOSPITAL_COMMUNITY): Payer: Medicaid Other

## 2020-12-14 LAB — GLUCOSE, CAPILLARY: Glucose-Capillary: 110 mg/dL — ABNORMAL HIGH (ref 70–99)

## 2020-12-14 NOTE — Lactation Note (Signed)
This note was copied from a baby's chart. Lactation Consultation Note Baby 21 hrs old. Cont. On TPT. Baby fussy. LC set up DEBP. FOB awake interpret for mom. Mom shown how to use DEBP & how to disassemble, clean, & reassemble parts. Mom knows to pump q3h for 15-20 min. Mom pumped w/drops of colostrum noted in flange. LC rubbed colostrum in baby's mouth w/gloved finger. LC changed diaper.  Gave baby to mom. Mom latched baby in cradle position. Baby latched well.  Patient Name: Megan [name] [id number] Date: 12/14/2020 Reason for consult: Follow-up assessment;Early term 37-38.6wks;Hyperbilirubinemia Age:62 hours  Maternal Data Has patient been taught Hand Expression?: Yes  Feeding Nipple Type: Extra Slow Flow  LATCH Score Latch: Grasps breast easily, tongue down, lips flanged, rhythmical sucking.  Audible Swallowing: Spontaneous and intermittent  Type of Nipple: Everted at rest and after stimulation  Comfort (Breast/Nipple): Soft / non-tender  Hold (Positioning): Assistance needed to correctly position infant at breast and maintain latch.  LATCH Score: 9   Lactation Tools Discussed/Used Tools: Pump Breast pump type: Double-Electric Breast Pump Pump Education: Setup, frequency, and cleaning;Milk Storage Pumping frequency: Q 3 hrs  Interventions Interventions: Breast feeding basics reviewed;Support pillows;Assisted with latch;Position options;Skin to skin;Expressed milk;Breast massage;Hand express;Breast compression;Adjust position;DEBP  Discharge WIC Program: No  Consult Status Consult Status: Follow-up Date: 12/14/20 (in pm) Follow-up type: In-patient    Charyl Dancer 12/14/2020, 6:56 AM

## 2020-12-14 NOTE — Anesthesia Postprocedure Evaluation (Signed)
Anesthesia Post Note  Patient: Lille Ringley  Procedure(s) Performed: AN AD HOC LABOR EPIDURAL     Patient location during evaluation: Mother Baby Anesthesia Type: Epidural Level of consciousness: awake and alert Pain management: pain level controlled Vital Signs Assessment: post-procedure vital signs reviewed and stable Respiratory status: spontaneous breathing, nonlabored ventilation and respiratory function stable Cardiovascular status: stable Postop Assessment: no headache, no backache, epidural receding, patient able to bend at knees, no apparent nausea or vomiting, able to ambulate and adequate PO intake Anesthetic complications: no   No complications documented.  Last Vitals:  Vitals:   12/14/20 0033 12/14/20 0546  BP: 109/74 111/86  Pulse: 95 80  Resp: 16 18  Temp: 36.9 C 36.7 C  SpO2: 99% 100%    Last Pain:  Vitals:   12/14/20 0546  TempSrc: Oral  PainSc:    Pain Goal: Patients Stated Pain Goal: 2 (12/13/20 1928)                 Blythe Stanford

## 2020-12-14 NOTE — Progress Notes (Addendum)
POSTPARTUM PROGRESS NOTE  Post Partum Day 1  Subjective:  Megan [name] is a 30 y.o. G3P3003 s/p VAVD at [redacted]w[redacted]d.  She reports she is doing well. No acute events overnight. She denies any problems with voiding or po intake. She endorses leg shakiness, aching, and tinging. Denies nausea or vomiting.  Pain is moderately controlled.  Lochia is appropriate.  Objective: Blood pressure 111/86, pulse 80, temperature 98.1 F (36.7 C), temperature source Oral, resp. rate 18, height 5' (1.524 m), weight 91.6 kg, last menstrual period 03/06/2020, SpO2 100 %, unknown if currently breastfeeding.  Physical Exam:  General: alert, cooperative and no distress Chest: no respiratory distress Heart:regular rate, distal pulses intact Abdomen: soft, nontender,  Uterine Fundus: firm, appropriately tender DVT Evaluation: No calf swelling, mild tenderness Extremities: No edema Skin: warm, dry  Recent Labs    12/13/20 0446  HGB 13.0  HCT 39.9   Results for orders placed or performed during the hospital encounter of 12/13/20 (from the past 24 hour(s))  Glucose, capillary     Status: Abnormal   Collection Time: 12/14/20  6:24 AM  Result Value Ref Range   Glucose-Capillary 110 (H) 70 - 99 mg/dL    Assessment/Plan: Megan [name] is a 30 y.o. G3P3003 s/p VAVD at [redacted]w[redacted]d   #PPD#1 - Doing well  Routine postpartum care #A1GDM: FBG 110 #Contraception: POPs #Feeding: Breast and bottle  #Dispo: Plan for discharge tomorrow, 5/24.   LOS: 1 day    I saw and evaluated the patient. I agree with the findings and the plan of care as documented in the medical student's note.  Casper Harrison, MD [facility] Family Medicine Fellow, [facility] for [facility], [facility]

## 2020-12-14 NOTE — Social Work (Signed)
CSW received consult for Edinburgh 12. CSW met with MOB to offer support and complete assessment.    CSW met with MOB at bedside. CSW used interpreter [phone]. CSW congratulated MOB and introduced role. MOB preferred support person/ FOB be present. CSW explain role and reason for the visit. MOB calm and receptive to the visit. CSW inquired how MOB feels since giving birth. MOB reports doing well.   CSW explained the [place] and discussed MOB answers. MOB denies feeling sad or anxious. MOB denies history of depression. MOB reports she is thinking positive, and nothing is wrong. CSW validated MOB positive feelings. CSW then provided education regarding the baby blues period vs. perinatal mood disorders. CSW inquired if MOB experienced postpartum depression. MOB denies history of postpartum depression. CSW discussed postpartum symptoms recommended MOB monitor self during the postpartum time period and encouraged MOB to contact a medical professional if symptoms are noted. CSW assessed MOB for safety. MOB reports thoughts of harm to self and others.     CSW provided review of Sudden Infant Death Syndrome (SIDS) precautions and informed MOB no-co sleeping with the infant. CSW inquired where the infant will sleep. MOB reports next to me pointing to the side of the bed, CSW asked MOB for clarification. FOB spoke up and said yes, they have a bassinet for the infant. CSW asked MOB if she had essential items for the infant. MOB reports yes. MOB has chosen Dr. Jess Barters the Pediatrician where her other children are seen. CSW assessed MOB for additional needs. MOB reports no further need.    CSW identifies no further need for intervention and no barriers to discharge at this time.  Kathrin Greathouse, MSW, LCSW [facility] [place] Worker  [phone] 12/14/2020  12:36 PM

## 2020-12-15 ENCOUNTER — Other Ambulatory Visit: Payer: Self-pay

## 2020-12-15 ENCOUNTER — Ambulatory Visit: Payer: Self-pay

## 2020-12-15 MED ORDER — COCONUT OIL OIL: 1.0000 "application " | TOPICAL_OIL | 0 refills | Status: DC | PRN

## 2020-12-15 MED ORDER — NORETHINDRONE 0.35 MG PO TABS
1.0000 | ORAL_TABLET | Freq: Every day | ORAL | 6 refills | Status: DC
  Filled 2020-12-15: qty 84, 84d supply, fill #0
  Filled 2021-05-17: qty 84, 84d supply, fill #1

## 2020-12-15 MED ORDER — ACETAMINOPHEN 325 MG PO TABS
650.0000 mg | ORAL_TABLET | Freq: Four times a day (QID) | ORAL | Status: DC | PRN
Start: 2020-12-15 — End: 2023-08-30

## 2020-12-15 MED ORDER — IBUPROFEN 600 MG PO TABS
600.0000 mg | ORAL_TABLET | Freq: Four times a day (QID) | ORAL | 0 refills | Status: DC
  Filled 2020-12-15: qty 30, 8d supply, fill #0

## 2020-12-15 NOTE — Progress Notes (Signed)
Discharge instructions reviewed with patient using [place] interpreter.  Pt acknowledged instructions.

## 2020-12-15 NOTE — Lactation Note (Signed)
This note was copied from Megan baby's chart. Lactation Consultation Note  Patient Name: Megan [name] [id number] Date: 12/15/2020 Age:28 hours   LC faxed [facility] referral per mother's request -Hyperbilirrubinemia  Megan [name] Megan [name] 12/15/2020, 8:13 PM

## 2020-12-16 ENCOUNTER — Other Ambulatory Visit: Payer: Self-pay

## 2020-12-16 ENCOUNTER — Inpatient Hospital Stay (HOSPITAL_COMMUNITY): Payer: Medicaid Other

## 2020-12-16 ENCOUNTER — Inpatient Hospital Stay (HOSPITAL_COMMUNITY)
Admission: AD | Admit: 2020-12-16 | Payer: Medicaid Other | Source: Home / Self Care | Admitting: Obstetrics & Gynecology

## 2021-01-08 ENCOUNTER — Other Ambulatory Visit: Payer: Self-pay

## 2021-01-11 ENCOUNTER — Other Ambulatory Visit: Payer: Self-pay

## 2021-01-11 DIAGNOSIS — O24419 Gestational diabetes mellitus in pregnancy, unspecified control: Secondary | ICD-10-CM

## 2021-01-12 ENCOUNTER — Other Ambulatory Visit: Payer: Self-pay

## 2021-01-14 ENCOUNTER — Other Ambulatory Visit: Payer: Self-pay

## 2021-01-14 ENCOUNTER — Other Ambulatory Visit: Payer: Medicaid Other

## 2021-01-14 ENCOUNTER — Encounter: Payer: Self-pay | Admitting: Obstetrics & Gynecology

## 2021-01-14 ENCOUNTER — Ambulatory Visit (INDEPENDENT_AMBULATORY_CARE_PROVIDER_SITE_OTHER): Payer: Medicaid Other | Admitting: Obstetrics & Gynecology

## 2021-01-14 DIAGNOSIS — M5412 Radiculopathy, cervical region: Secondary | ICD-10-CM | POA: Diagnosis not present

## 2021-01-14 DIAGNOSIS — O24419 Gestational diabetes mellitus in pregnancy, unspecified control: Secondary | ICD-10-CM

## 2021-01-14 MED ORDER — IBUPROFEN 600 MG PO TABS
600.0000 mg | ORAL_TABLET | Freq: Four times a day (QID) | ORAL | 0 refills | Status: DC
  Filled 2021-01-14: qty 30, 8d supply, fill #0

## 2021-01-14 MED ORDER — GABAPENTIN 300 MG PO CAPS
300.0000 mg | ORAL_CAPSULE | Freq: Two times a day (BID) | ORAL | 1 refills | Status: DC
  Filled 2021-01-14: qty 60, 30d supply, fill #0

## 2021-01-14 NOTE — Patient Instructions (Signed)
Cervical Radiculopathy  Cervical radiculopathy means that a nerve in the neck (a cervical nerve) is pinched or bruised. This can happen because of an injury to the cervical spine (vertebrae) in the neck, or as a normal part of getting older. This can cause pain or loss of feeling (numbness) that runs from your neck all the way down to your arm and fingers. Often, this condition gets better with rest. Treatment may be needed if the conditiondoes not get better. What are the causes? A neck injury. A bulging disk in your spine. Muscle movements that you cannot control (muscle spasms). Tight muscles in your neck due to overuse. Arthritis. Breakdown in the bones and joints of the spine (spondylosis) due to getting older. Bone spurs that form near the nerves in the neck. What are the signs or symptoms? Pain. The pain may: Run from the neck to the arm and hand. Be very bad or irritating. Be worse when you move your neck. Loss of feeling or tingling in your arm or hand. Weakness in your arm or hand, in very bad cases. How is this treated? In many cases, treatment is not needed for this condition. With rest, the condition often gets better over time. If treatment is needed, options may include: Wearing a soft neck collar (cervical collar) for short periods of time, as told by your doctor. Doing exercises (physical therapy) to strengthen your neck muscles. Taking medicines. Having shots (injections) in your spine, in very bad cases. Having surgery. This may be needed if other treatments do not help. The type of surgery that is used depends on the cause of your condition. Follow these instructions at home: If you have a soft neck collar: Wear it as told by your doctor. Remove it only as told by your doctor. Ask your doctor if you can remove the collar for cleaning and bathing. If you are allowed to remove the collar for cleaning or bathing: Follow instructions from your doctor about how to remove  the collar safely. Clean the collar by wiping it with mild soap and water and drying it completely. Take out any removable pads in the collar every 1-2 days. Wash them by hand with soap and water. Let them air-dry completely before you put them back in the collar. Check your skin under the collar for redness or sores. If you see any, tell your doctor. Managing pain     Take over-the-counter and prescription medicines only as told by your doctor. If told, put ice on the painful area. If you have a soft neck collar, remove it as told by your doctor. Put ice in a plastic bag. Place a towel between your skin and the bag. Leave the ice on for 20 minutes, 2-3 times a day. If using ice does not help, you can try using heat. Use the heat source that your doctor recommends, such as a moist heat pack or a heating pad. Place a towel between your skin and the heat source. Leave the heat on for 20-30 minutes. Remove the heat if your skin turns bright red. This is very important if you are unable to feel pain, heat, or cold. You may have a greater risk of getting burned. You may try a gentle neck and shoulder rub (massage). Activity Rest as needed. Return to your normal activities as told by your doctor. Ask your doctor what activities are safe for you. Do exercises as told by your doctor or physical therapist. Do not lift anything that   is heavier than 10 lb (4.5 kg) until your doctor tells you that it is safe. General instructions Use a flat pillow when you sleep. Do not drive while wearing a soft neck collar. If you do not have a soft neck collar, ask your doctor if it is safe to drive while your neck heals. Ask your doctor if the medicine prescribed to you requires you to avoid driving or using heavy machinery. Do not use any products that contain nicotine or tobacco, such as cigarettes, e-cigarettes, and chewing tobacco. These can delay healing. If you need help quitting, ask your doctor. Keep all  follow-up visits as told by your doctor. This is important. Contact a doctor if: Your condition does not get better with treatment. Get help right away if: Your pain gets worse and is not helped with medicine. You lose feeling or feel weak in your hand, arm, face, or leg. You have a high fever. You have a stiff neck. You cannot control when you poop or pee (have incontinence). You have trouble with walking, balance, or talking. Summary Cervical radiculopathy means that a nerve in the neck is pinched or bruised. A nerve can get pinched from a bulging disk, arthritis, an injury to the neck, or other causes. Symptoms include pain, tingling, or loss of feeling that goes from the neck into the arm or hand. Weakness in your arm or hand can happen in very bad cases. Treatment may include resting, wearing a soft neck collar, and doing exercises. You might need to take medicines for pain. In very bad cases, shots or surgery may be needed. This information is not intended to replace advice given to you by your health care provider. Make sure you discuss any questions you have with your healthcare provider. Document Revised: 06/01/2018 Document Reviewed: 06/01/2018 Elsevier Patient Education  2022 Elsevier Inc.  

## 2021-01-14 NOTE — Progress Notes (Signed)
Post Partum Visit Note  Megan [name] is a 30 y.o. G28P3003 female who presents for a postpartum visit. She is 4 weeks postpartum following a normal spontaneous vaginal delivery.  I have fully reviewed the prenatal and intrapartum course. The delivery was at 38/4 gestational weeks.  Anesthesia: epidural. Postpartum course has been good. Baby is doing well. Baby is feeding by both breast and bottle - Carnation Good Start. Bleeding no bleeding. Bowel function is normal. Bladder function is normal. Patient is sexually active. Contraception method is oral progesterone-only contraceptive. Postpartum depression screening: negative.   The pregnancy intention screening data noted above was reviewed. Potential methods of contraception were discussed. The patient elected to proceed with Oral Contraceptive.     Health Maintenance Due  Topic Date Due   URINE MICROALBUMIN  Never done   COVID-19 Vaccine (3 - Booster for Moderna series) 05/14/2020    The following portions of the patient's history were reviewed and updated as appropriate: allergies, current medications, past family history, past medical history, past social history, past surgical history, and problem list.  Review of Systems Musculoskeletal:positive for bilateral hand and arm pain and wart on her finger Neurological: positive for paresthesia  Objective:  There were no vitals taken for this visit.   General:  alert, cooperative, and no distress   Breasts:  not indicated  Lungs: Effort normal   Heart:  regular rate and rhythm, S1, S2 normal, no murmur, click, rub or gallop  Abdomen: soft, non-tender; bowel sounds normal; no masses,  no organomegaly   Wound N/a  GU exam:  not indicated       Assessment:    There are no diagnoses linked to this encounter.  4 week postpartum exam. Postpartum state  Cervical radiculopathy at C6 - Plan: gabapentin (NEURONTIN) 300 MG capsule, ibuprofen (ADVIL) 600 MG tablet   Plan:    Essential components of care per ACOG recommendations:  1.  Mood and well being: Patient with negative depression screening today. Reviewed local resources for support.  - Patient tobacco use? No.   - hx of drug use? No.    2. Infant care and feeding:  -Patient currently breastmilk feeding? Yes. Discussed returning to work and pumping.  -Social determinants of health (SDOH) reviewed in EPIC. No concerns 3. Sexuality, contraception and birth spacing - Patient does not want a pregnancy in the next year.  Desired family size is 3 children.  - Reviewed forms of contraception in tiered fashion. Patient desired oral progesterone-only contraceptive today.   - Discussed birth spacing of 18 months  4. Sleep and fatigue -Encouraged family/partner/community support of 4 hrs of uninterrupted sleep to help with mood and fatigue  5. Physical Recovery  - Discussed patients delivery and complications. She describes her labor as good. - Patient had a Vaginal, no problems at delivery. - Patient has urinary incontinence? No. - Patient is safe to resume physical and sexual activity  6.  Health Maintenance - HM due items addressed Yes - Last pap smear  Diagnosis  Date Value Ref Range Status  06/22/2020   Final   - Negative for intraepithelial lesion or malignancy (NILM)   Pap smear not done at today's visit.  -Breast Cancer screening indicated? No.   7. Chronic Disease/Pregnancy Condition follow up: [facility], Postpartum state  Cervical radiculopathy at C6 - Plan: gabapentin (NEURONTIN) 300 MG capsule, ibuprofen (ADVIL) 600 MG tablet   - PCP follow up  Adam Phenix, MD  Center for [facility],   Medical Group  

## 2021-01-14 NOTE — Progress Notes (Signed)
Pt was Given Birth Control Pills in the Hospital. Also request Ibuprofen Refill for joint pain.

## 2021-01-15 ENCOUNTER — Other Ambulatory Visit: Payer: Self-pay

## 2021-01-15 LAB — GLUCOSE TOLERANCE, 2 HOURS
Glucose, 2 hour: 94 mg/dL (ref 65–139)
Glucose, GTT - Fasting: 89 mg/dL (ref 65–99)

## 2021-01-20 ENCOUNTER — Other Ambulatory Visit: Payer: Self-pay

## 2021-05-17 ENCOUNTER — Other Ambulatory Visit: Payer: Self-pay

## 2022-03-19 ENCOUNTER — Encounter: Payer: Self-pay | Admitting: Family Medicine

## 2022-03-19 ENCOUNTER — Ambulatory Visit: Payer: Medicaid Other | Attending: Family | Admitting: Family Medicine

## 2022-03-19 VITALS — BP 112/74 | HR 81 | Resp 16 | Wt 175.0 lb

## 2022-03-19 DIAGNOSIS — K5901 Slow transit constipation: Secondary | ICD-10-CM | POA: Diagnosis not present

## 2022-03-19 DIAGNOSIS — K648 Other hemorrhoids: Secondary | ICD-10-CM

## 2022-03-19 DIAGNOSIS — B079 Viral wart, unspecified: Secondary | ICD-10-CM

## 2022-03-19 MED ORDER — HYDROCORT-PRAMOXINE (PERIANAL) 2.5-1 % EX CREA
1.0000 | TOPICAL_CREAM | Freq: Three times a day (TID) | CUTANEOUS | 1 refills | Status: DC
  Filled 2022-03-19: qty 30, 10d supply, fill #0

## 2022-03-19 MED ORDER — IMIQUIMOD 5 % EX CREA
TOPICAL_CREAM | CUTANEOUS | 0 refills | Status: DC
  Filled 2022-03-19: qty 12, 28d supply, fill #0

## 2022-03-19 MED ORDER — POLYETHYLENE GLYCOL 3350 17 GM/SCOOP PO POWD
17.0000 g | Freq: Every day | ORAL | 1 refills | Status: AC
  Filled 2022-03-19: qty 238, 14d supply, fill #0

## 2022-03-19 NOTE — Patient Instructions (Signed)

## 2022-03-19 NOTE — Progress Notes (Signed)
Subjective:  Patient ID: Megan [name], female    DOB: 17-Oct-1990  Age: 31 y.o. MRN: [id number]  CC: Hemorrhoids   HPI Megan [name] is a 31 y.o. year old female here for an acute visit.  She is 29 years postpartum.  Interval History: She Complains of painful hemorrhoids for over a year and endorses being constipated. She has noticed blood in her stool as well. Symptoms have been present since she was pregnant last year  Right index finger also has a painful lesion which she noticed over the last 1 year as well.  Lesion is not itchy. Past Medical History:  Diagnosis Date   COVID-19 affecting pregnancy in second trimester 10/22/2020   08/01/20    Irregular menstrual cycle 03/02/2016   Medical history non-contributory    PAC (premature atrial contraction) 06/27/2014   Polycystic ovaries 07/24/2015    Past Surgical History:  Procedure Laterality Date   NO PAST SURGERIES      Family History  Problem Relation Age of Onset   Diabetes Mother    Hypertension Mother     Social History   Socioeconomic History   Marital status: Married    Spouse name: Not on file   Number of children: 2   Years of education: Not on file   Highest education level: Not on file  Occupational History   Not on file  Tobacco Use   Smoking status: Never   Smokeless tobacco: Never  Vaping Use   Vaping Use: Never used  Substance and Sexual Activity   Alcohol use: No   Drug use: No   Sexual activity: Yes    Birth control/protection: None  Other Topics Concern   Not on file  Social History Narrative   Nepalese   2 young boys    Social Determinants of Health   Financial Resource Strain: Not on file  Food Insecurity: No Food Insecurity (12/11/2020)   Hunger Vital Sign    Worried About Running Out of Food in the Last Year: Never true    Ran Out of Food in the Last Year: Never true  Transportation Needs: No Transportation Needs (12/11/2020)   PRAPARE - Administrator, Civil Service  (Medical): No    Lack of Transportation (Non-Medical): No  Physical Activity: Not on file  Stress: Not on file  Social Connections: Not on file    No Known Allergies  Outpatient Medications Prior to Visit  Medication Sig Dispense Refill   acetaminophen (TYLENOL) 325 MG tablet Take 2 tablets (650 mg total) by mouth every 6 (six) hours as needed for mild pain, moderate pain, fever or headache (for pain scale < 4).     coconut oil OIL Apply 1 application topically as needed (nipple pain).  0   docusate sodium (COLACE) 100 MG capsule Take 1 capsule (100 mg total) by mouth 2 (two) times daily. 60 capsule 5   gabapentin (NEURONTIN) 300 MG capsule Take 1 capsule (300 mg total) by mouth 2 (two) times daily. 60 capsule 1   hydrocortisone (ANUSOL-HC) 2.5 % rectal cream Place rectally 2 (two) times daily. 30 g 2   ibuprofen (ADVIL) 600 MG tablet Take 1 tablet (600 mg total) by mouth every 6 (six) hours. 30 tablet 0   norethindrone (ORTHO MICRONOR) 0.35 MG tablet Take 1 tablet (0.35 mg total) by mouth daily. 56 tablet 6   Prenatal Vit-Fe Fumarate-FA (MULTIVITAMIN-PRENATAL) 27-0.8 MG TABS tablet Take 1 tablet by mouth daily at 12 noon. 30 tablet  1   polyethylene glycol powder (MIRALAX) 17 GM/SCOOP powder Take 17 g by mouth daily. 238 g 0   No facility-administered medications prior to visit.     ROS Review of Systems  Constitutional:  Negative for activity change and appetite change.  HENT:  Negative for sinus pressure and sore throat.   Respiratory:  Negative for chest tightness, shortness of breath and wheezing.   Cardiovascular:  Negative for chest pain and palpitations.  Gastrointestinal:  Positive for blood in stool and constipation. Negative for abdominal distention and abdominal pain.  Genitourinary: Negative.   Musculoskeletal: Negative.   Skin:        See HPI  Psychiatric/Behavioral:  Negative for behavioral problems and dysphoric mood.     Objective:  BP 112/74 (BP Location: Left  Arm, Patient Position: Sitting, Cuff Size: Normal)   Pulse 81   Resp 16   Wt 175 lb (79.4 kg)   SpO2 100%   BMI 32.01 kg/m      03/19/2022   10:32 AM 01/14/2021    9:17 AM 12/14/2020    1:10 PM  BP/Weight  Systolic BP 112 115 118  Diastolic BP 74 68 88  Wt. (Lbs) 175 176.8   BMI 32.01 kg/m2 32.34 kg/m2       Physical Exam Constitutional:      Appearance: She is well-developed.  Cardiovascular:     Rate and Rhythm: Normal rate.     Heart sounds: Normal heart sounds. No murmur heard. Pulmonary:     Effort: Pulmonary effort is normal.     Breath sounds: Normal breath sounds. No wheezing or rales.  Chest:     Chest wall: No tenderness.  Abdominal:     General: Bowel sounds are normal. There is no distension.     Palpations: Abdomen is soft. There is no mass.     Tenderness: There is no abdominal tenderness.  Genitourinary:    Comments: Hemorrhoid at 7:00 and 6:00, nonthrombosed Musculoskeletal:        General: Normal range of motion.     Right lower leg: No edema.     Left lower leg: No edema.  Skin:    Comments: Nodule on right index finger with whitish surface.  Neurological:     Mental Status: She is alert and oriented to person, place, and time.  Psychiatric:        Mood and Affect: Mood normal.        Latest Ref Rng & Units 08/20/2020   11:57 AM 06/22/2020    3:50 PM 06/18/2020    1:47 PM  CMP  Glucose 70 - 99 mg/dL 737  71  86   BUN 6 - 20 mg/dL 6  5  <5   Creatinine 1.06 - 1.00 mg/dL 2.69  4.85  4.62   Sodium 135 - 145 mmol/L 137  138  136   Potassium 3.5 - 5.1 mmol/L 3.6  4.0  3.6   Chloride 98 - 111 mmol/L 106  102  104   CO2 22 - 32 mmol/L 19  22  19    Calcium 8.9 - 10.3 mg/dL 9.0  9.7  9.2   Total Protein 6.5 - 8.1 g/dL 6.6  7.6    Total Bilirubin 0.3 - 1.2 mg/dL 0.3  0.2    Alkaline Phos 38 - 126 U/L 46  60    AST 15 - 41 U/L 21  22    ALT 0 - 44 U/L 11  16  Lipid Panel     Component Value Date/Time   CHOL 161 08/17/2015 0948   TRIG  123 08/17/2015 0948   HDL 38 (L) 08/17/2015 0948   CHOLHDL 4.2 08/17/2015 0948   VLDL 25 08/17/2015 0948   LDLCALC 98 08/17/2015 0948    CBC    Component Value Date/Time   WBC 12.5 (H) 12/13/2020 0446   RBC 4.64 12/13/2020 0446   HGB 13.0 12/13/2020 0446   HGB 13.1 10/08/2020 0928   HCT 39.9 12/13/2020 0446   HCT 39.3 10/08/2020 0928   PLT 278 12/13/2020 0446   PLT 277 10/08/2020 0928   MCV 86.0 12/13/2020 0446   MCV 83 10/08/2020 0928   MCH 28.0 12/13/2020 0446   MCHC 32.6 12/13/2020 0446   RDW 14.9 12/13/2020 0446   RDW 13.3 10/08/2020 0928   LYMPHSABS 2.6 06/22/2020 1550   MONOABS 1.0 12/21/2013 2035   EOSABS 0.1 06/22/2020 1550   BASOSABS 0.1 06/22/2020 1550    Lab Results  Component Value Date   HGBA1C 5.6 06/22/2020    Assessment & Plan:  1. Slow transit constipation Counseled on increasing fiber intake, fruits and vegetable, limit intake of foods like cheese, white bread, white rice - polyethylene glycol powder (MIRALAX) 17 GM/SCOOP powder; Take 17 g by mouth daily.  Dispense: 238 g; Refill: 1  2. Other hemorrhoids Advised to use sitz bath - hydrocortisone-pramoxine (ANALPRAM HC) 2.5-1 % rectal cream; Place 1 Application rectally 3 (three) times daily.  Dispense: 30 g; Refill: 1  3. Viral wart on finger - imiquimod (ALDARA) 5 % cream; Apply topically 3 (three) times a week. Apply to finger  Dispense: 12 each; Refill: 0    Meds ordered this encounter  Medications   hydrocortisone-pramoxine (ANALPRAM HC) 2.5-1 % rectal cream    Sig: Place 1 Application rectally 3 (three) times daily.    Dispense:  30 g    Refill:  1   polyethylene glycol powder (MIRALAX) 17 GM/SCOOP powder    Sig: Take 17 g by mouth daily.    Dispense:  238 g    Refill:  1   imiquimod (ALDARA) 5 % cream    Sig: Apply topically 3 (three) times a week. Apply to finger    Dispense:  12 each    Refill:  0    Follow-up: Return if symptoms worsen or fail to improve.       Hoy Register, MD, FAAFP. [facility] [place], [place] [phone]   03/19/2022, 11:30 AM

## 2022-03-19 NOTE — Progress Notes (Signed)
Pt presents for hemorrhoids -states been 1 year, experiencing pain and bleeding

## 2022-03-21 ENCOUNTER — Other Ambulatory Visit: Payer: Self-pay

## 2022-07-15 ENCOUNTER — Ambulatory Visit: Payer: Medicaid Other | Attending: Family Medicine

## 2022-07-15 ENCOUNTER — Other Ambulatory Visit: Payer: Self-pay

## 2022-07-15 ENCOUNTER — Other Ambulatory Visit: Payer: Self-pay | Admitting: Family Medicine

## 2022-07-15 DIAGNOSIS — B079 Viral wart, unspecified: Secondary | ICD-10-CM

## 2022-07-15 DIAGNOSIS — Z23 Encounter for immunization: Secondary | ICD-10-CM | POA: Diagnosis not present

## 2022-07-15 DIAGNOSIS — M5412 Radiculopathy, cervical region: Secondary | ICD-10-CM

## 2022-07-15 MED ORDER — IMIQUIMOD 5 % EX CREA
TOPICAL_CREAM | CUTANEOUS | 0 refills | Status: DC
  Filled 2022-07-15: qty 12, 28d supply, fill #0

## 2022-07-15 NOTE — Progress Notes (Signed)
Influenza vaccine administered per protocols.  Information sheet given. Patient denies and pain or discomfort at injection site. Tolerated injection well no reaction.  

## 2022-07-20 ENCOUNTER — Other Ambulatory Visit: Payer: Self-pay

## 2022-07-20 MED ORDER — GABAPENTIN 300 MG PO CAPS
300.0000 mg | ORAL_CAPSULE | Freq: Two times a day (BID) | ORAL | 1 refills | Status: DC
  Filled 2022-07-20: qty 60, 30d supply, fill #0

## 2022-07-21 ENCOUNTER — Other Ambulatory Visit: Payer: Self-pay

## 2022-09-30 IMAGING — US US MFM OB FOLLOW-UP
1 series · 13 of 28 positions shown · non-contrast
Comparison: none

[Series 1: us mfm ob follow-up · 13 of 88 slices shown]
[im 4/88]
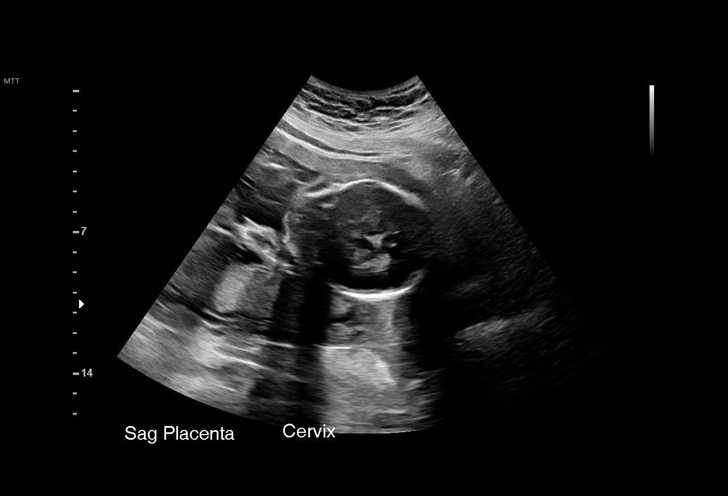
[im 10/88]
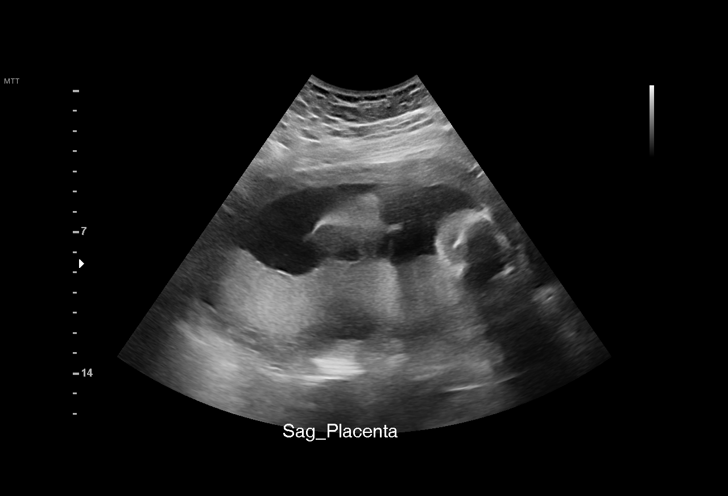
[im 17/88]
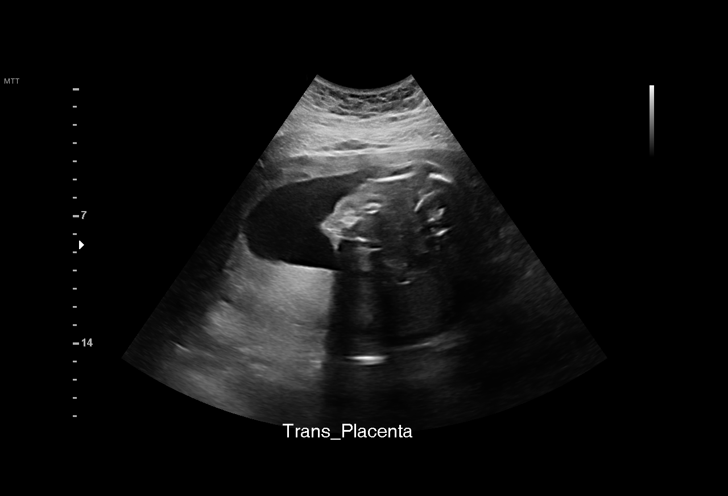
[im 23/88]
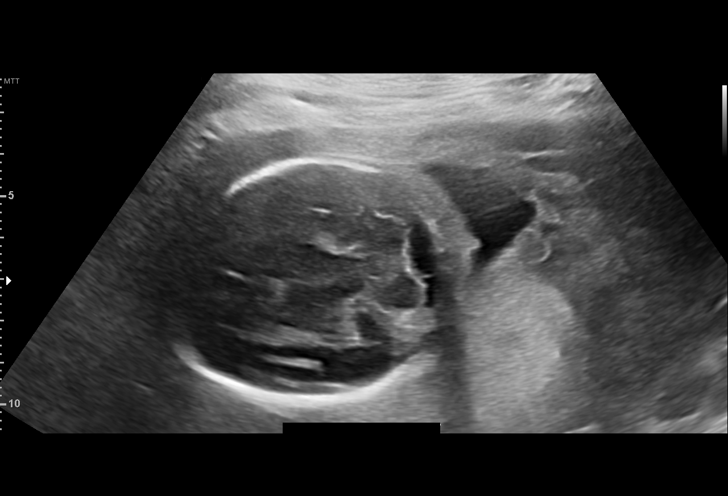
[im 30/88]
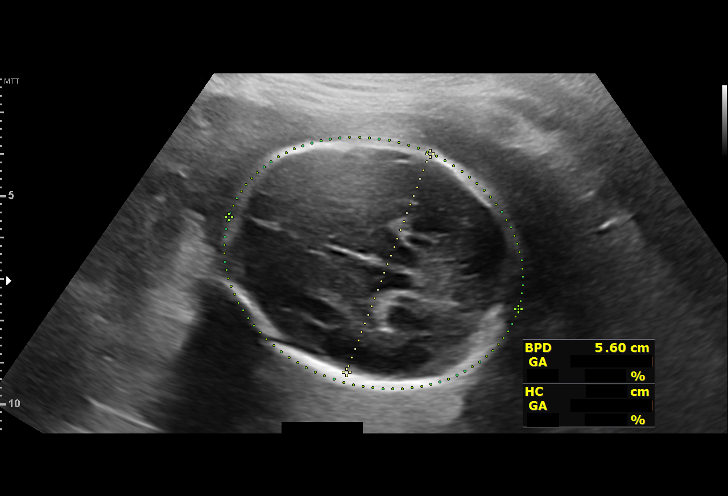
[im 36/88]
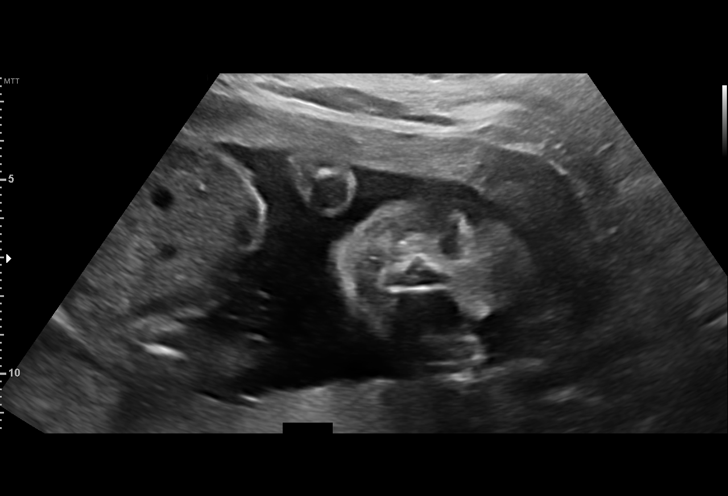
[im 46/88]
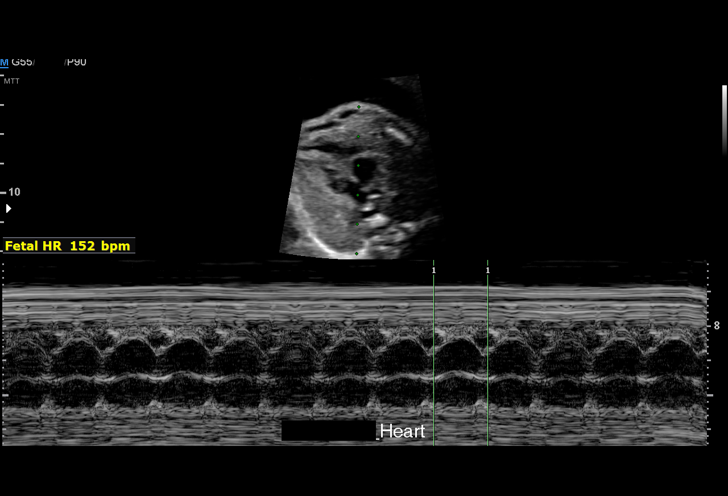
[im 52/88]
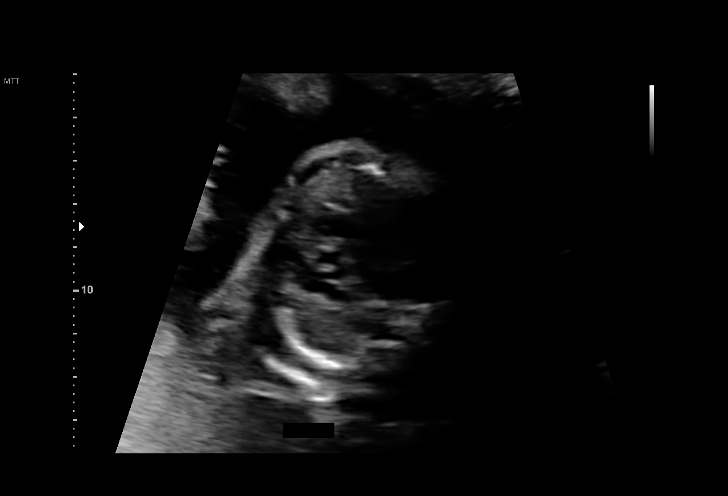
[im 59/88]
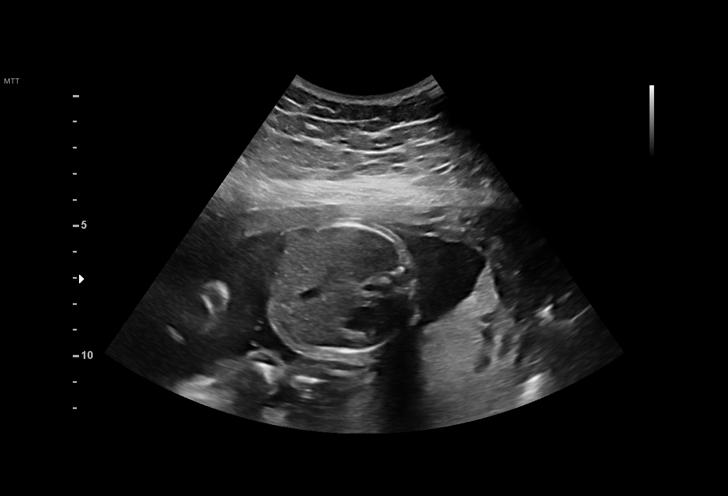
[im 65/88]
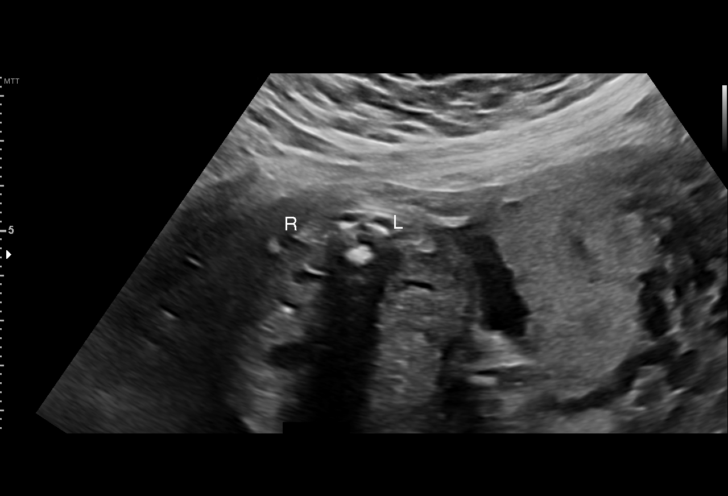
[im 71/88]
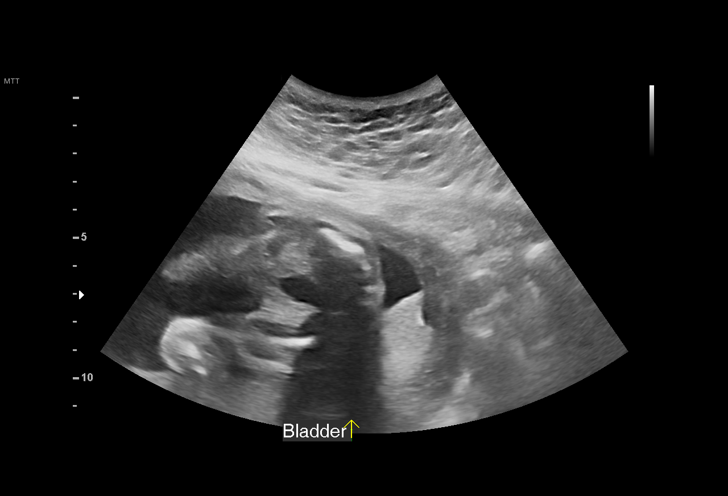
[im 78/88]
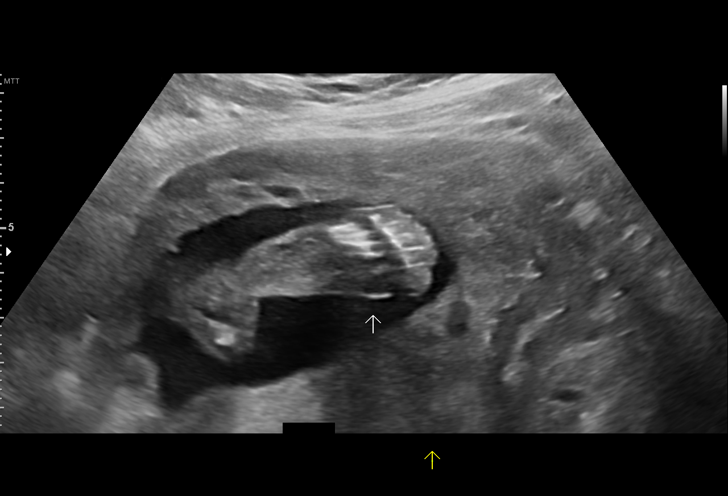
[im 84/88]
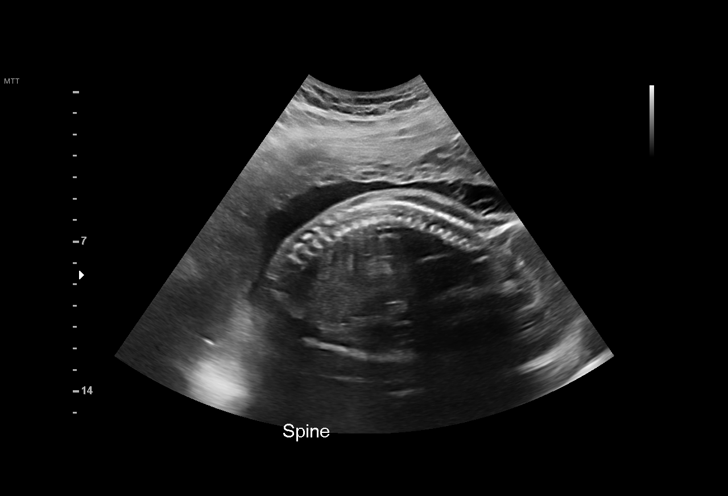

[13 of 28 positions shown; findings below may reference images not displayed]

Indications

 23 weeks gestation of pregnancy
 Medical complication of pregnancy (elevated
 rheumatoid factor)
 Transient hypertension
 Low risk NIPS, neg Horizon 27
 Heart disease in miother affecting pregnancy
 in second trimester (PACs)
 Low lying placenta, antepartum
 Medical complication of pregnancy (Covid-      O[DATE])
 Encounter for other antenatal screening
 follow-up
Fetal Evaluation

 Num Of Fetuses:         1
 Fetal Heart Rate(bpm):  152
 Cardiac Activity:       Observed
 Presentation:           Cephalic
 Placenta:               Posterior
 P. Cord Insertion:      Visualized, central

 Amniotic Fluid
 AFI FV:      Within normal limits

                             Largest Pocket(cm)

Biometry

 BPD:      56.5  mm     G. Age:  23w 2d         44  %    CI:        75.05   %    70 - 86
                                                         FL/HC:      20.6   %    19.2 -
 HC:      206.9  mm     G. Age:  22w 6d         17  %    HC/AC:      1.13        1.05 -
 AC:      183.3  mm     G. Age:  23w 1d         37  %    FL/BPD:     75.4   %    71 - 87
 FL:       42.6  mm     G. Age:  23w 6d         59  %    FL/AC:      23.2   %    20 - 24
 CER:      26.4  mm     G. Age:  23w 5d         86  %

 LV:        6.7  mm
 CM:        6.7  mm

 Est. FW:     592  gm      1 lb 5 oz     48  %
OB History

 Gravidity:    3         Term:   2        Prem:   0        SAB:   0
 TOP:          0       Ectopic:  0        Living: 2
Gestational Age

 LMP:           25w 0d        Date:  03/06/20                 EDD:   12/11/20
 U/S Today:     23w 2d                                        EDD:   12/23/20
 Best:          23w 2d     Det. By:  U/S  (07/29/20)          EDD:   12/23/20
Anatomy

 Cranium:               Appears normal         LVOT:                   Appears normal
 Cavum:                 Appears normal         Aortic Arch:            Previously seen
 Ventricles:            Appears normal         Ductal Arch:            Previously seen
 Choroid Plexus:        Previously seen        Diaphragm:              Appears normal
 Cerebellum:            Appears normal         Stomach:                Appears normal, left
                                                                       sided
 Posterior Fossa:       Appears normal         Abdomen:                Appears normal
 Nuchal Fold:           Previously seen        Abdominal Wall:         Appears nml (cord
                                                                       insert, abd wall)
 Face:                  Orbits and profile     Cord Vessels:           Appears normal (3
                        previously seen                                vessel cord)
 Lips:                  Previously seen        Kidneys:                Appear normal
 Palate:                Previously seen        Bladder:                Appears normal
 Thoracic:              Appears normal         Spine:                  Appears normal
 Heart:                 Appears normal         Upper Extremities:      Previously seen
                        (4CH, axis, and
                        situs)
 RVOT:                  Appears normal         Lower Extremities:      Previously seen

 Other:  Heels, Nasal bone, VC, 3VV, 3VTV, Lenses visualized previously.
Cervix Uterus Adnexa

 Cervix
 Length:           3.51  cm.
 Normal appearance by transabdominal scan.

 Uterus
 No abnormality visualized.
 Right Ovary
 Within normal limits.

 Left Ovary
 Within normal limits.

 Cul De Sac
 No free fluid seen.

 Adnexa
 No abnormality visualized.
Impression

 Patient returned for completion of fetal anatomy.  She had
 24TWX-5B infection and has completely recovered.  She
 reports she had COVID vaccination.
 Patient had transient hypertension and was evaluated at the
 [facility] yesterday.  Her blood pressure today at our office is
 106/67 mmHg.  She feels well.
 She also has elevated rheumatoid factor but does not have
 any rheumatological conditions

 Fetal biometry is consistent with the previously established
 dates.  Amniotic fluid is normal and good fetal activity seen.
 Four-chamber view appears normal.  Fetal anatomical survey
 was completed.

 We recommend a follow-up fetal growth assessment
 because of her recent Covid infection and transient
 hypertension.

 I explained ultrasound findings and counseled the patient with
 help of language interpreter ([employer] #[id number]).
Recommendations

 -An appointment was made for her to return in 4 weeks for
 fetal growth assessment.
                 Iii, Salas

## 2022-11-11 ENCOUNTER — Other Ambulatory Visit: Payer: Self-pay

## 2022-11-11 ENCOUNTER — Other Ambulatory Visit: Payer: Self-pay | Admitting: Family Medicine

## 2022-11-11 MED ORDER — NORETHINDRONE 0.35 MG PO TABS
1.0000 | ORAL_TABLET | Freq: Every day | ORAL | 0 refills | Status: DC
  Filled 2022-11-11: qty 28, 28d supply, fill #0

## 2022-11-14 ENCOUNTER — Other Ambulatory Visit: Payer: Self-pay

## 2022-11-17 ENCOUNTER — Other Ambulatory Visit: Payer: Self-pay

## 2023-02-09 ENCOUNTER — Ambulatory Visit: Payer: Medicaid Other | Attending: Physician Assistant | Admitting: Physician Assistant

## 2023-02-09 ENCOUNTER — Encounter: Payer: Self-pay | Admitting: Physician Assistant

## 2023-02-09 ENCOUNTER — Other Ambulatory Visit: Payer: Self-pay | Admitting: Physician Assistant

## 2023-02-09 ENCOUNTER — Other Ambulatory Visit: Payer: Self-pay

## 2023-02-09 VITALS — BP 119/83 | HR 82 | Wt 180.4 lb

## 2023-02-09 DIAGNOSIS — K648 Other hemorrhoids: Secondary | ICD-10-CM

## 2023-02-09 DIAGNOSIS — Z603 Acculturation difficulty: Secondary | ICD-10-CM

## 2023-02-09 DIAGNOSIS — K5901 Slow transit constipation: Secondary | ICD-10-CM

## 2023-02-09 DIAGNOSIS — Z3009 Encounter for other general counseling and advice on contraception: Secondary | ICD-10-CM

## 2023-02-09 DIAGNOSIS — B079 Viral wart, unspecified: Secondary | ICD-10-CM

## 2023-02-09 DIAGNOSIS — Z758 Other problems related to medical facilities and other health care: Secondary | ICD-10-CM

## 2023-02-09 DIAGNOSIS — M5412 Radiculopathy, cervical region: Secondary | ICD-10-CM

## 2023-02-09 MED ORDER — HYDROCORTISONE ACETATE 25 MG RE SUPP
25.0000 mg | Freq: Two times a day (BID) | RECTAL | 0 refills | Status: AC
  Filled 2023-02-09: qty 12, 6d supply, fill #0

## 2023-02-09 MED ORDER — HYDROCORT-PRAMOXINE (PERIANAL) 1-1 % EX FOAM
1.0000 | Freq: Two times a day (BID) | CUTANEOUS | 3 refills | Status: AC
  Filled 2023-02-09: qty 10, 19d supply, fill #0

## 2023-02-09 MED ORDER — NORETHINDRONE 0.35 MG PO TABS
1.0000 | ORAL_TABLET | Freq: Every day | ORAL | 11 refills | Status: DC
  Filled 2023-02-09: qty 28, 28d supply, fill #0
  Filled 2023-08-14: qty 28, 28d supply, fill #1

## 2023-02-09 MED ORDER — HYDROCORT-PRAMOXINE (PERIANAL) 2.5-1 % EX CREA
1.0000 | TOPICAL_CREAM | Freq: Three times a day (TID) | CUTANEOUS | 1 refills | Status: DC
  Filled 2023-02-09: qty 30, 10d supply, fill #0

## 2023-02-09 MED ORDER — GABAPENTIN 300 MG PO CAPS
300.0000 mg | ORAL_CAPSULE | Freq: Two times a day (BID) | ORAL | 3 refills | Status: DC
  Filled 2023-02-09: qty 60, 30d supply, fill #0

## 2023-02-09 NOTE — Patient Instructions (Signed)
Compound W for warts Hemorrhoids Hemorrhoids are swollen veins that may form: In the butt (rectum). These are called internal hemorrhoids. Around the opening of the butt (anus). These are called external hemorrhoids. Most hemorrhoids do not cause very bad problems. They often get better with changes to your lifestyle and what you eat. What are the causes? Having trouble pooping (constipation) or watery poop (diarrhea). Pushing too hard when you poop. Pregnancy. Being very overweight (obese). Sitting for too long. Riding a bike for a long time. Heavy lifting or other things that take a lot of effort. Anal sex. What are the signs or symptoms? Pain. Itching or soreness in the butt. Bleeding from the butt. Leaking poop. Swelling. One or more lumps around the opening of your butt. How is this treated? In most cases, hemorrhoids can be treated at home. You may be told to: Change what you eat. Make changes to your lifestyle. If these treatments do not help, you may need to have a procedure done. Your doctor may need to: Place rubber bands at the bottom of the hemorrhoids to make them fall off. Put medicine into the hemorrhoids to shrink them. Shine a type of light on the hemorrhoids to cause them to fall off. Do surgery to get rid of the hemorrhoids. Follow these instructions at home: Medicines Take over-the-counter and prescription medicines only as told by your doctor. Use creams with medicine in them or medicines that you put in your butt as told by your doctor. Eating and drinking  Eat foods that have a lot of fiber in them. These include whole grains, beans, nuts, fruits, and vegetables. Ask your doctor about taking products that have fiber added to them (fibersupplements). Take in less fat. You can do this by: Eating low-fat dairy products. Eating less red meat. Staying away from processed foods. Drink enough fluid to keep your pee (urine) pale yellow. Managing pain and  swelling  Take a warm-water bath (sitz bath) for 20 minutes to ease pain. Do this 3-4 times a day. You may do this in a bathtub. You may also use a portable sitz bath that fits over the toilet. If told, put ice on the painful area. It may help to use ice between your warm baths. Put ice in a plastic bag. Place a towel between your skin and the bag. Leave the ice on for 20 minutes, 2-3 times a day. If your skin turns bright red, take off the ice right away to prevent skin damage. The risk of damage is higher if you cannot feel pain, heat, or cold. General instructions Exercise. Ask your doctor how much and what kind of exercise is best for you. Go to the bathroom when you need to poop. Do not wait. Try not to push too hard when you poop. Keep your butt dry and clean. Use wet toilet paper or moist towelettes after you poop. Do not sit on the toilet for a long time. Contact a doctor if: You have pain and swelling that do not get better with treatment. You have trouble pooping. You cannot poop. You have pain or swelling outside the area of the hemorrhoids. Get help right away if: You have bleeding from the butt that will not stop. This information is not intended to replace advice given to you by your health care provider. Make sure you discuss any questions you have with your health care provider. Document Revised: 03/23/2022 Document Reviewed: 03/23/2022 Elsevier Patient Education  2024 [employer].

## 2023-02-09 NOTE — Progress Notes (Signed)
Patient ID: Megan [name], female   DOB: 19-Jan-1991, 32 y.o.   MRN: [id number]    Megan [name], is a 32 y.o. female  [id number]  [id number]  DOB - 01-14-1991  Chief Complaint  Patient presents with   Hemorrhoids       Subjective:   Megan [name] is a 32 y.o. female here today for several issues.  Needs RF on BC pills.  Non-smoker.    Also having trouble with hemorrhoids and pain with defecation.  This started about 1 year ago.    Wart on finger.    Needs RF on gabapentin.    No problems updated.  ALLERGIES: No Known Allergies  PAST MEDICAL HISTORY: Past Medical History:  Diagnosis Date   COVID-19 affecting pregnancy in second trimester 10/22/2020   08/01/20    Irregular menstrual cycle 03/02/2016   Medical history non-contributory    PAC (premature atrial contraction) 06/27/2014   Polycystic ovaries 07/24/2015    MEDICATIONS AT HOME: Prior to Admission medications   Medication Sig Start Date End Date Taking? Authorizing Provider  hydrocortisone (ANUSOL-HC) 25 MG suppository Place 1 suppository (25 mg total) rectally 2 (two) times daily. 02/09/23  Yes Anders Simmonds, PA-C  acetaminophen (TYLENOL) 325 MG tablet Take 2 tablets (650 mg total) by mouth every 6 (six) hours as needed for mild pain, moderate pain, fever or headache (for pain scale < 4). Patient not taking: Reported on 02/09/2023 12/15/20   Rosalio Loud, MD  coconut oil OIL Apply 1 application topically as needed (nipple pain). Patient not taking: Reported on 02/09/2023 12/15/20   Rosalio Loud, MD  docusate sodium (COLACE) 100 MG capsule Take 1 capsule (100 mg total) by mouth 2 (two) times daily. Patient not taking: Reported on 02/09/2023 12/11/20   Reva Bores, MD  gabapentin (NEURONTIN) 300 MG capsule Take 1 capsule (300 mg total) by mouth 2 (two) times daily. 02/09/23   Anders Simmonds, PA-C  hydrocortisone (ANUSOL-HC) 2.5 % rectal cream Place rectally 2 (two) times daily. Patient  not taking: Reported on 02/09/2023 12/11/20   Reva Bores, MD  hydrocortisone-pramoxine [facility]) 2.5-1 % rectal cream Place 1 Application rectally 3 (three) times daily. 02/09/23   Anders Simmonds, PA-C  ibuprofen (ADVIL) 600 MG tablet Take 1 tablet (600 mg total) by mouth every 6 (six) hours. Patient not taking: Reported on 02/09/2023 01/14/21   Adam Phenix, MD  imiquimod [facility]) 5 % cream Apply topically 3 (three) times a week to finger. Patient not taking: Reported on 02/09/2023 07/15/22   Hoy Register, MD  norethindrone (ORTHO MICRONOR) 0.35 MG tablet Take 1 tablet (0.35 mg total) by mouth daily. 02/09/23   Anders Simmonds, PA-C  polyethylene glycol powder (MIRALAX) 17 GM/SCOOP powder Mix 1 capful (17 g) with 4-8 ounces of water/beverage and take by mouth daily. Patient not taking: Reported on 02/09/2023 03/19/22   Hoy Register, MD  Prenatal Vit-Fe Fumarate-FA (MULTIVITAMIN-PRENATAL) 27-0.8 MG TABS tablet Take 1 tablet by mouth daily at 12 noon. Patient not taking: Reported on 02/09/2023 06/02/20   Hoy Register, MD    ROS: Neg HEENT Neg resp Neg cardiac Neg GI Neg GU Neg MS Neg psych Neg neuro  Objective:   Vitals:   02/09/23 0916  BP: 119/83  Pulse: 82  SpO2: 96%  Weight: 180 lb 6.4 oz (81.8 kg)   Exam General appearance : Awake, alert, not in any distress. Speech Clear. Not toxic looking HEENT: Atraumatic and Normocephalic Neck: Supple,  no JVD. No cervical lymphadenopathy.  Chest: Good air entry bilaterally, CTAB.  No rales/rhonchi/wheezing CVS: S1 S2 regular, no murmurs.  Abdomen: Bowel sounds present, Non tender and not distended with no gaurding, rigidity or rebound. Axternal anus with non-thrombosed hemorrhoid that is soft to the touch. Extremities: B/L Lower Ext shows no edema, both legs are warm to touch Neurology: Awake alert, and oriented X 3, CN II-XII intact, Non focal Skin: No Rash  Data Review Lab Results  Component Value Date   HGBA1C  5.6 06/22/2020   HGBA1C 5.6 08/23/2016   HGBA1C 5.50 08/17/2015    Assessment & Plan   1. Other hemorrhoids - hydrocortisone-pramoxine (ANALPRAM HC) 2.5-1 % rectal cream; Place 1 Application rectally 3 (three) times daily.  Dispense: 30 g; Refill: 1 - hydrocortisone (ANUSOL-HC) 25 MG suppository; Place 1 suppository (25 mg total) rectally 2 (two) times daily.  Dispense: 12 suppository; Refill: 0 - Ambulatory referral to Gastroenterology  2. Cervical radiculopathy at C6 - gabapentin (NEURONTIN) 300 MG capsule; Take 1 capsule (300 mg total) by mouth 2 (two) times daily.  Dispense: 60 capsule; Refill: 3  3. Viral wart on finger Compound W.  Instructed on use with interpreter and had patient take a picture  4. Slow transit constipation Increase water and fiber  5. Language barrier affecting health care AMN  Sabae [id number] interpreters used and additional time performing visit was required.   6. Birth control counseling - norethindrone (ORTHO MICRONOR) 0.35 MG tablet; Take 1 tablet (0.35 mg total) by mouth daily.  Dispense: 28 tablet; Refill: 11    Return in about 6 months (around 08/12/2023) for PCP/[facility] for chronic conditions.  The patient was given clear instructions to go to ER or return to medical center if symptoms don't improve, worsen or new problems develop. The patient verbalized understanding. The patient was told to call to get lab results if they haven't heard anything in the next week.      Georgian Co, PA-C [facility] [place], [place] [phone]   02/09/2023, 10:26 AM

## 2023-02-10 ENCOUNTER — Other Ambulatory Visit: Payer: Self-pay

## 2023-02-13 ENCOUNTER — Other Ambulatory Visit: Payer: Self-pay

## 2023-05-19 ENCOUNTER — Ambulatory Visit: Payer: Medicaid Other | Attending: Nurse Practitioner

## 2023-05-19 ENCOUNTER — Ambulatory Visit: Payer: Medicaid Other

## 2023-05-19 DIAGNOSIS — Z23 Encounter for immunization: Secondary | ICD-10-CM | POA: Diagnosis not present

## 2023-05-19 NOTE — Progress Notes (Cosign Needed Addendum)
Flu vaccine administered in per protocols.  Information sheet given. Patient denies and pain or discomfort at injection site. Tolerated injection well no reaction.

## 2023-05-19 NOTE — Addendum Note (Signed)
Addended by: Elsie Lincoln F on: 05/19/2023 04:35 PM   Modules accepted: Level of Service

## 2023-05-30 ENCOUNTER — Other Ambulatory Visit: Payer: Self-pay

## 2023-05-30 ENCOUNTER — Other Ambulatory Visit (INDEPENDENT_AMBULATORY_CARE_PROVIDER_SITE_OTHER): Payer: Medicaid Other

## 2023-05-30 ENCOUNTER — Ambulatory Visit: Payer: Medicaid Other | Admitting: Internal Medicine

## 2023-05-30 ENCOUNTER — Encounter: Payer: Self-pay | Admitting: Internal Medicine

## 2023-05-30 VITALS — BP 116/80 | HR 74 | Ht 63.0 in | Wt 181.0 lb

## 2023-05-30 DIAGNOSIS — K625 Hemorrhage of anus and rectum: Secondary | ICD-10-CM

## 2023-05-30 DIAGNOSIS — K649 Unspecified hemorrhoids: Secondary | ICD-10-CM

## 2023-05-30 DIAGNOSIS — K5900 Constipation, unspecified: Secondary | ICD-10-CM | POA: Diagnosis not present

## 2023-05-30 DIAGNOSIS — K6289 Other specified diseases of anus and rectum: Secondary | ICD-10-CM

## 2023-05-30 LAB — CBC
HCT: 43.7 % (ref 36.0–46.0)
Hemoglobin: 14.2 g/dL (ref 12.0–15.0)
MCHC: 32.5 g/dL (ref 30.0–36.0)
MCV: 84 fL (ref 78.0–100.0)
Platelets: 373 10*3/uL (ref 150.0–400.0)
RBC: 5.2 Mil/uL — ABNORMAL HIGH (ref 3.87–5.11)
RDW: 13.7 % (ref 11.5–15.5)
WBC: 9.3 10*3/uL (ref 4.0–10.5)

## 2023-05-30 MED ORDER — HYDROCORTISONE (PERIANAL) 2.5 % EX CREA
1.0000 | TOPICAL_CREAM | Freq: Two times a day (BID) | CUTANEOUS | 0 refills | Status: AC
  Filled 2023-05-30: qty 30, 10d supply, fill #0

## 2023-05-30 NOTE — Patient Instructions (Addendum)
Your provider has requested that you go to the basement level for lab work before leaving today. Press "B" on the elevator. The lab is located at the first door on the left as you exit the elevator.   We have sent the following medications to your pharmacy for you to pick up at your convenience: [facility] taking a daily fiber supplement and avoid straining   You are scheduled for a follow up visit won 09/20/23 at 8:30 am  _______________________________________________________  If your blood pressure at your visit was 140/90 or greater, please contact your primary care physician to follow up on this.  _______________________________________________________  If you are age 43 or older, your body mass index should be between 23-30. Your Body mass index is 32.06 kg/m. If this is out of the aforementioned range listed, please consider follow up with your Primary Care Provider.  If you are age 53 or younger, your body mass index should be between 19-25. Your Body mass index is 32.06 kg/m. If this is out of the aformentioned range listed, please consider follow up with your Primary Care Provider.   ________________________________________________________  The [facility] providers would like to encourage you to use [facility] to communicate with providers for non-urgent requests or questions.  Due to long hold times on the telephone, sending your provider a message by [facility] may be a faster and more efficient way to get a response.  Please allow 48 business hours for a response.  Please remember that this is for non-urgent requests.  _______________________________________________________  Due to recent changes in healthcare laws, you may see the results of your imaging and laboratory studies on MyChart before your provider has had a chance to review them.  We understand that in some cases there may be results that are confusing or concerning to you. Not all laboratory results come back in the  same time frame and the provider may be waiting for multiple results in order to interpret others.  Please give [place] 48 hours in order for your provider to thoroughly review all the results before contacting the office for clarification of your results.   Thank you for entrusting me with your care and for choosing [facility], Dr. Eulah Pont

## 2023-05-30 NOTE — Progress Notes (Signed)
Chief Complaint: Hemorrhoids  HPI : 32 year with history of PCOS presents hemorrhoids  Patient's interview was conducted with the Burmese video interpreter.  She has had hemorrhoids for the last 3 years.  These hemorrhoids worsened after her pregnancy.  She has been noticing rectal bleeding and rectal pain.  At times the hemorrhoids are sticking out of her anus. The bleeding is bright red. She strains when she has a BM. Even if she doesn't have to strain, she will have rectal bleeding. She has 2 BMs per day on average. Denies diarrhea or constipation. She takes Miralax once a day to help prevent constipation, which was recommended by her PCP. Weight has been stable. Denies N&V, dysphagia, or ab pain. Denies acid reflux. Denies family history of GI issues. Denies prior colonoscopy or EGD.  Patient states that she has not tried anything for her hemorrhoids.  Wt Readings from Last 3 Encounters:  05/30/23 181 lb (82.1 kg)  02/09/23 180 lb 6.4 oz (81.8 kg)  03/19/22 175 lb (79.4 kg)   Past Medical History:  Diagnosis Date   COVID-19 affecting pregnancy in second trimester 10/22/2020   08/01/20    Irregular menstrual cycle 03/02/2016   Medical history non-contributory    PAC (premature atrial contraction) 06/27/2014   Polycystic ovaries 07/24/2015   Past Surgical History:  Procedure Laterality Date   NO PAST SURGERIES     Family History  Problem Relation Age of Onset   Diabetes Mother    Hypertension Mother    Liver disease Neg Hx    Esophageal cancer Neg Hx    Colon cancer Neg Hx    Social History   Tobacco Use   Smoking status: Never   Smokeless tobacco: Never  Vaping Use   Vaping status: Never Used  Substance Use Topics   Alcohol use: No   Drug use: No   Current Outpatient Medications  Medication Sig Dispense Refill   acetaminophen (TYLENOL) 325 MG tablet Take 2 tablets (650 mg total) by mouth every 6 (six) hours as needed for mild pain, moderate pain, fever or headache (for  pain scale < 4).     docusate sodium (COLACE) 100 MG capsule Take 1 capsule (100 mg total) by mouth 2 (two) times daily. 60 capsule 5   gabapentin (NEURONTIN) 300 MG capsule Take 1 capsule (300 mg total) by mouth 2 (two) times daily. 60 capsule 3   hydrocortisone (ANUSOL-HC) 25 MG suppository Place 1 suppository (25 mg total) rectally 2 (two) times daily. 12 suppository 0   hydrocortisone-pramoxine (PROCTOFOAM-HC) rectal foam Apply rectally 2 (two) times daily. 10 g 3   ibuprofen (ADVIL) 600 MG tablet Take 1 tablet (600 mg total) by mouth every 6 (six) hours. 30 tablet 0   norethindrone (ORTHO MICRONOR) 0.35 MG tablet Take 1 tablet (0.35 mg total) by mouth daily. 28 tablet 11   polyethylene glycol powder (MIRALAX) 17 GM/SCOOP powder Mix 1 capful (17 g) with 4-8 ounces of water/beverage and take by mouth daily. 238 g 1   Prenatal Vit-Fe Fumarate-FA (MULTIVITAMIN-PRENATAL) 27-0.8 MG TABS tablet Take 1 tablet by mouth daily at 12 noon. 30 tablet 1   coconut oil OIL Apply 1 application topically as needed (nipple pain). (Patient not taking: Reported on 02/09/2023)  0   imiquimod (ALDARA) 5 % cream Apply topically 3 (three) times a week to finger. (Patient not taking: Reported on 02/09/2023) 12 each 0   No current facility-administered medications for this visit.   No Known Allergies  Review of Systems: All systems reviewed and negative except where noted in HPI.   Physical Exam: BP 116/80   Pulse 74   Ht 5\' 3"  (1.6 m)   Wt 181 lb (82.1 kg)   BMI 32.06 kg/m  Constitutional: Pleasant,well-developed, female in no acute distress. HEENT: Normocephalic and atraumatic. Conjunctivae are normal. No scleral icterus. Cardiovascular: Normal rate, regular rhythm.  Pulmonary/chest: Effort normal and breath sounds normal. No wheezing, rales or rhonchi. Abdominal: Soft, nondistended, nontender. Bowel sounds active throughout. There are no masses palpable. No hepatomegaly. Rectal: Grade 2 internal  hemorrhoids visualized and tender to palpation.  Patient's hemorrhoids were not prolapsed during my exam today.  No obvious signs of anal fissure. Extremities: No edema Neurological: Alert and oriented to person place and time. Skin: Skin is warm and dry. No rashes noted. Psychiatric: Normal mood and affect. Behavior is normal.  Labs 09/2020: TSH nml.   Labs 11/2020: CBC with elevated WBC of 12.5.   ASSESSMENT AND PLAN: Rectal pain Rectal bleeding Hemorrhoids Constipation Patient presents with hemorrhoids that been present for the last 3 years.  Endorses rectal pain and rectal bleeding as result of her hemorrhoids.  She has grade 2 internal hemorrhoids on anoscopy exam today.  I went over some conservative strategies to help with her hemorrhoids. Will have her try Anusol HC cream twice daily for 7 days.  Will also check her blood count to make sure that this looks okay in the setting of her rectal bleeding.   - Use Sitz baths, daily fiber supplement, and avoid straining - Continue Miralax QD - Check CBC - Anusol HC cream BID for 7 days - If the above is not effective, then could consider hemorrhoidal banding or colonoscopy in the future - RTC in 2-3 months  Eulah Pont, MD  I spent 60 minutes of time, including in depth chart review, independent review of results as outlined above, communicating results with the patient directly, face-to-face time with the patient, coordinating care, ordering studies and medications as appropriate, and documentation.

## 2023-06-02 ENCOUNTER — Other Ambulatory Visit: Payer: Self-pay

## 2023-08-14 ENCOUNTER — Other Ambulatory Visit: Payer: Self-pay

## 2023-08-14 ENCOUNTER — Ambulatory Visit: Payer: Medicaid Other | Admitting: Family Medicine

## 2023-08-30 ENCOUNTER — Ambulatory Visit: Payer: Medicaid Other | Attending: Family Medicine | Admitting: Family Medicine

## 2023-08-30 ENCOUNTER — Encounter: Payer: Self-pay | Admitting: Family Medicine

## 2023-08-30 VITALS — BP 130/88 | HR 84 | Ht 63.0 in | Wt 180.0 lb

## 2023-08-30 DIAGNOSIS — Z131 Encounter for screening for diabetes mellitus: Secondary | ICD-10-CM

## 2023-08-30 DIAGNOSIS — Z13228 Encounter for screening for other metabolic disorders: Secondary | ICD-10-CM

## 2023-08-30 DIAGNOSIS — K648 Other hemorrhoids: Secondary | ICD-10-CM

## 2023-08-30 DIAGNOSIS — D170 Benign lipomatous neoplasm of skin and subcutaneous tissue of head, face and neck: Secondary | ICD-10-CM

## 2023-08-30 NOTE — Progress Notes (Signed)
 Subjective:  Patient ID: Megan [name], female    DOB: [birth year]  Age: 33 y.o. MRN: [id number]  CC: Medical Management of Chronic Issues (Knot on back of head)   HPI Megan [name] is a 33 y.o. year old female with no significant PMH.  Interval History: Discussed the use of AI scribe software for clinical note transcription with the patient, who gave verbal consent to proceed.  She presents with a knot on the back of her head that has been present for over two months. The knot is not painful unless pressure is applied, such as when lying on it. The patient reports no discharge from the lesion but experiences pain radiating throughout the head when the knot is painful. The patient has not noticed any changes in the size or characteristics of the knot over time.  In addition to the knot, the patient is under the care of a GI specialist for a hemorrhoids. The patient reports that despite medication, she has not noticed any shrinkage of the hemorrhoid. The patient is due for a follow-up appointment with the GI specialist but has forgotten the date.        Past Medical History:  Diagnosis Date   COVID-19 affecting pregnancy in second trimester 10/22/2020   08/01/20    Irregular menstrual cycle 03/02/2016   Medical history non-contributory    PAC (premature atrial contraction) 06/27/2014   Polycystic ovaries 07/24/2015    Past Surgical History:  Procedure Laterality Date   NO PAST SURGERIES      Family History  Problem Relation Age of Onset   Diabetes Mother    Hypertension Mother    Liver disease Neg Hx    Esophageal cancer Neg Hx    Colon cancer Neg Hx     Social History   Socioeconomic History   Marital status: Married    Spouse name: Not on file   Number of children: 2   Years of education: Not on file   Highest education level: Not on file  Occupational History   Occupation: house wife  Tobacco Use   Smoking status: Never   Smokeless tobacco: Never  Vaping Use    Vaping status: Never Used  Substance and Sexual Activity   Alcohol use: No   Drug use: No   Sexual activity: Yes    Birth control/protection: None  Other Topics Concern   Not on file  Social History Narrative   Nepalese   2 young boys    Social Drivers of Corporate Investment Banker Strain: Not on file  Food Insecurity: No Food Insecurity (12/11/2020)   Hunger Vital Sign    Worried About Running Out of Food in the Last Year: Never true    Ran Out of Food in the Last Year: Never true  Transportation Needs: No Transportation Needs (12/11/2020)   PRAPARE - Administrator, Civil Service (Medical): No    Lack of Transportation (Non-Medical): No  Physical Activity: Not on file  Stress: Not on file  Social Connections: Not on file    No Known Allergies  Outpatient Medications Prior to Visit  Medication Sig Dispense Refill   coconut oil OIL Apply 1 application topically as needed (nipple pain). (Patient not taking: Reported on 08/30/2023)  0   docusate sodium  (COLACE) 100 MG capsule Take 1 capsule (100 mg total) by mouth 2 (two) times daily. (Patient not taking: Reported on 08/30/2023) 60 capsule 5   hydrocortisone  (ANUSOL -HC) 25 MG suppository Place 1  suppository (25 mg total) rectally 2 (two) times daily. (Patient not taking: Reported on 08/30/2023) 12 suppository 0   hydrocortisone -pramoxine (PROCTOFOAM -HC) rectal foam Apply rectally 2 (two) times daily. (Patient not taking: Reported on 08/30/2023) 10 g 3   imiquimod  (ALDARA ) 5 % cream Apply topically 3 (three) times a week to finger. (Patient not taking: Reported on 08/30/2023) 12 each 0   polyethylene glycol powder (MIRALAX ) 17 GM/SCOOP powder Mix 1 capful (17 g) with 4-8 ounces of water/beverage and take by mouth daily. (Patient not taking: Reported on 08/30/2023) 238 g 1   Prenatal Vit-Fe Fumarate-FA (MULTIVITAMIN-PRENATAL) 27-0.8 MG TABS tablet Take 1 tablet by mouth daily at 12 noon. (Patient not taking: Reported on 08/30/2023) 30  tablet 1   acetaminophen  (TYLENOL ) 325 MG tablet Take 2 tablets (650 mg total) by mouth every 6 (six) hours as needed for mild pain, moderate pain, fever or headache (for pain scale < 4). (Patient not taking: Reported on 08/30/2023)     gabapentin  (NEURONTIN ) 300 MG capsule Take 1 capsule (300 mg total) by mouth 2 (two) times daily. (Patient not taking: Reported on 08/30/2023) 60 capsule 3   ibuprofen  (ADVIL ) 600 MG tablet Take 1 tablet (600 mg total) by mouth every 6 (six) hours. (Patient not taking: Reported on 08/30/2023) 30 tablet 0   norethindrone  (ORTHO MICRONOR ) 0.35 MG tablet Take 1 tablet (0.35 mg total) by mouth daily. (Patient not taking: Reported on 08/30/2023) 28 tablet 11   No facility-administered medications prior to visit.     ROS Review of Systems  Constitutional:  Negative for activity change and appetite change.  HENT:  Negative for sinus pressure and sore throat.   Respiratory:  Negative for chest tightness, shortness of breath and wheezing.   Cardiovascular:  Negative for chest pain and palpitations.  Gastrointestinal:  Negative for abdominal distention, abdominal pain and constipation.  Genitourinary: Negative.   Musculoskeletal: Negative.   Psychiatric/Behavioral:  Negative for behavioral problems and dysphoric mood.     Objective:  BP 130/88   Pulse 84   Ht 5' 3 (1.6 m)   Wt 180 lb (81.6 kg)   SpO2 97%   BMI 31.89 kg/m      08/30/2023    9:25 AM 05/30/2023    9:23 AM 02/09/2023    9:16 AM  BP/Weight  Systolic BP 130 116 119  Diastolic BP 88 80 83  Wt. (Lbs) 180 181 180.4  BMI 31.89 kg/m2 32.06 kg/m2 33 kg/m2      Physical Exam Constitutional:      Appearance: She is well-developed.  HENT:     Head:     Comments: Soft slightly tender horizontal induration in occipital region without punctum, discharge Cardiovascular:     Rate and Rhythm: Normal rate.     Heart sounds: Normal heart sounds. No murmur heard. Pulmonary:     Effort: Pulmonary effort is  normal.     Breath sounds: Normal breath sounds. No wheezing or rales.  Chest:     Chest wall: No tenderness.  Abdominal:     General: Bowel sounds are normal. There is no distension.     Palpations: Abdomen is soft. There is no mass.     Tenderness: There is no abdominal tenderness.  Musculoskeletal:        General: Normal range of motion.     Right lower leg: No edema.     Left lower leg: No edema.  Neurological:     Mental Status: She is alert  and oriented to person, place, and time.  Psychiatric:        Mood and Affect: Mood normal.        Latest Ref Rng & Units 08/20/2020   11:57 AM 06/22/2020    3:50 PM 06/18/2020    1:47 PM  CMP  Glucose 70 - 99 mg/dL 863  71  86   BUN 6 - 20 mg/dL 6  5  <5   Creatinine 9.55 - 1.00 mg/dL 9.41  9.46  9.57   Sodium 135 - 145 mmol/L 137  138  136   Potassium 3.5 - 5.1 mmol/L 3.6  4.0  3.6   Chloride 98 - 111 mmol/L 106  102  104   CO2 22 - 32 mmol/L 19  22  19    Calcium 8.9 - 10.3 mg/dL 9.0  9.7  9.2   Total Protein 6.5 - 8.1 g/dL 6.6  7.6    Total Bilirubin 0.3 - 1.2 mg/dL 0.3  0.2    Alkaline Phos 38 - 126 U/L 46  60    AST 15 - 41 U/L 21  22    ALT 0 - 44 U/L 11  16      Lipid Panel     Component Value Date/Time   CHOL 161 08/17/2015 0948   TRIG 123 08/17/2015 0948   HDL 38 (L) 08/17/2015 0948   CHOLHDL 4.2 08/17/2015 0948   VLDL 25 08/17/2015 0948   LDLCALC 98 08/17/2015 0948    CBC    Component Value Date/Time   WBC 9.3 05/30/2023 1021   RBC 5.20 (H) 05/30/2023 1021   HGB 14.2 05/30/2023 1021   HGB 13.1 10/08/2020 0928   HCT 43.7 05/30/2023 1021   HCT 39.3 10/08/2020 0928   PLT 373.0 05/30/2023 1021   PLT 277 10/08/2020 0928   MCV 84.0 05/30/2023 1021   MCV 83 10/08/2020 0928   MCH 28.0 12/13/2020 0446   MCHC 32.5 05/30/2023 1021   RDW 13.7 05/30/2023 1021   RDW 13.3 10/08/2020 0928   LYMPHSABS 2.6 06/22/2020 1550   MONOABS 1.0 12/21/2013 2035   EOSABS 0.1 06/22/2020 1550   BASOSABS 0.1 06/22/2020 1550     Lab Results  Component Value Date   HGBA1C 5.6 06/22/2020    Assessment & Plan:      Scalp Lesion Painful lesion on the back of the head for over two months. No discharge. On examination, it does not appear to be a boil. Suspected lipoma -Order an ultrasound of the scalp lesion.  Hemorrhoid Patient has been seen by a GI specialist and is on medication. No improvement noted by the patient. -Increase fiber to avoid constipation -Advise patient to keep follow-up appointment with GI specialist.  General Health Maintenance -Order fasting blood tests to check kidney function, liver function, cholesterol, and diabetes screening. -Schedule a follow-up appointment in six weeks for a Pap smear.          No orders of the defined types were placed in this encounter.   Follow-up: Return in about 6 weeks (around 10/11/2023) for Pap smear.       Corrina Sabin, MD, FAAFP. [facility] [place], [place] [phone]   08/30/2023, 10:29 AM

## 2023-08-30 NOTE — Patient Instructions (Signed)
 VISIT SUMMARY:  During today's visit, we discussed the knot on the back of your head and your ongoing treatment for hemorrhoids. We also reviewed your general health maintenance needs.  YOUR PLAN:  -SCALP LESION: You have a painful knot on the back of your head that has been present for over two months. It does not appear to be a boil and is suspected to be fatty tissue. We will order an ultrasound to get a better understanding of the lesion.  -HEMORRHOID: You are currently under the care of a GI specialist for hemorrhoids and are on medication. Since you have not noticed any improvement, it is important to keep your follow-up appointment with the GI specialist.  -GENERAL HEALTH MAINTENANCE: We will order fasting blood tests to check your kidney function, liver function, cholesterol, and screen for diabetes. Additionally, we will schedule a follow-up appointment in six weeks for a Pap smear.  INSTRUCTIONS:  Please schedule an ultrasound for the scalp lesion as soon as possible. Remember to keep your follow-up appointment with the GI specialist for your hemorrhoids. Also, schedule your fasting blood tests and follow-up appointment for a Pap smear in six weeks.

## 2023-08-31 LAB — CMP14+EGFR
ALT: 21 [IU]/L (ref 0–32)
AST: 23 [IU]/L (ref 0–40)
Albumin: 4.4 g/dL (ref 3.9–4.9)
Alkaline Phosphatase: 67 [IU]/L (ref 44–121)
BUN/Creatinine Ratio: 13 (ref 9–23)
BUN: 8 mg/dL (ref 6–20)
Bilirubin Total: <0.2 mg/dL (ref 0.0–1.2)
CO2: 16 mmol/L — ABNORMAL LOW (ref 20–29)
Calcium: 9.4 mg/dL (ref 8.7–10.2)
Chloride: 104 mmol/L (ref 96–106)
Creatinine, Ser: 0.64 mg/dL (ref 0.57–1.00)
Globulin, Total: 3.6 g/dL (ref 1.5–4.5)
Glucose: 113 mg/dL — ABNORMAL HIGH (ref 70–99)
Potassium: 4.5 mmol/L (ref 3.5–5.2)
Sodium: 144 mmol/L (ref 134–144)
Total Protein: 8 g/dL (ref 6.0–8.5)
eGFR: 121 mL/min/{1.73_m2} (ref >59)

## 2023-08-31 LAB — LP+NON-HDL CHOLESTEROL
Cholesterol, Total: 235 mg/dL — ABNORMAL HIGH (ref 100–199)
HDL: 55 mg/dL (ref >39)
LDL Chol Calc (NIH): 157 mg/dL — ABNORMAL HIGH (ref 0–99)
Total Non-HDL-Chol (LDL+VLDL): 180 mg/dL — ABNORMAL HIGH (ref 0–129)
Triglycerides: 128 mg/dL (ref 0–149)
VLDL Cholesterol Cal: 23 mg/dL (ref 5–40)

## 2023-08-31 LAB — CBC WITH DIFFERENTIAL/PLATELET
Basophils Absolute: 0.1 10*3/uL (ref 0.0–0.2)
Basos: 1 %
EOS (ABSOLUTE): 0.3 10*3/uL (ref 0.0–0.4)
Eos: 3 %
Hematocrit: 45.6 % (ref 34.0–46.6)
Hemoglobin: 14.5 g/dL (ref 11.1–15.9)
Immature Grans (Abs): 0 10*3/uL (ref 0.0–0.1)
Immature Granulocytes: 0 %
Lymphocytes Absolute: 3.5 10*3/uL — ABNORMAL HIGH (ref 0.7–3.1)
Lymphs: 33 %
MCH: 27.5 pg (ref 26.6–33.0)
MCHC: 31.8 g/dL (ref 31.5–35.7)
MCV: 87 fL (ref 79–97)
Monocytes Absolute: 0.8 10*3/uL (ref 0.1–0.9)
Monocytes: 7 %
Neutrophils Absolute: 6 10*3/uL (ref 1.4–7.0)
Neutrophils: 56 %
Platelets: 406 10*3/uL (ref 150–450)
RBC: 5.27 x10E6/uL (ref 3.77–5.28)
RDW: 12.7 % (ref 11.7–15.4)
WBC: 10.6 10*3/uL (ref 3.4–10.8)

## 2023-08-31 LAB — HEMOGLOBIN A1C
Est. average glucose Bld gHb Est-mCnc: 126 mg/dL
Hgb A1c MFr Bld: 6 % — ABNORMAL HIGH (ref 4.8–5.6)

## 2023-09-04 ENCOUNTER — Ambulatory Visit
Admission: RE | Admit: 2023-09-04 | Discharge: 2023-09-04 | Disposition: A | Discharge status: Home / Self Care | Payer: Medicaid Other | Source: Ambulatory Visit | Attending: Family Medicine | Admitting: Family Medicine

## 2023-09-04 DIAGNOSIS — D170 Benign lipomatous neoplasm of skin and subcutaneous tissue of head, face and neck: Secondary | ICD-10-CM

## 2023-09-20 ENCOUNTER — Encounter: Payer: Self-pay | Admitting: Internal Medicine

## 2023-09-20 ENCOUNTER — Ambulatory Visit: Payer: Medicaid Other | Admitting: Internal Medicine

## 2023-09-20 VITALS — BP 140/100 | HR 67 | Ht 63.0 in | Wt 180.0 lb

## 2023-09-20 DIAGNOSIS — K641 Second degree hemorrhoids: Secondary | ICD-10-CM | POA: Diagnosis not present

## 2023-09-20 DIAGNOSIS — K5900 Constipation, unspecified: Secondary | ICD-10-CM

## 2023-09-20 DIAGNOSIS — K6289 Other specified diseases of anus and rectum: Secondary | ICD-10-CM

## 2023-09-20 DIAGNOSIS — K625 Hemorrhage of anus and rectum: Secondary | ICD-10-CM

## 2023-09-20 DIAGNOSIS — K649 Unspecified hemorrhoids: Secondary | ICD-10-CM

## 2023-09-20 NOTE — Progress Notes (Signed)
 Chief Complaint: Hemorrhoids  HPI : 4 year with history of PCOS presents for follow-up of hemorrhoids  She has had hemorrhoids for the last 3 years.  These hemorrhoids worsened after her pregnancy.    Interval History: Patient's interview was conducted with the Burmese video interpreter. Patient states that the Anusol HC cream did help with her hemorrhoids.  Her rectal bleeding and rectal pain are improved but she does believe that her right remained inflamed.  She has continued with her MiraLAX.  Wt Readings from Last 3 Encounters:  09/20/23 180 lb (81.6 kg)  08/30/23 180 lb (81.6 kg)  05/30/23 181 lb (82.1 kg)   Past Medical History:  Diagnosis Date   COVID-19 affecting pregnancy in second trimester 10/22/2020   08/01/20    Irregular menstrual cycle 03/02/2016   Medical history non-contributory    PAC (premature atrial contraction) 06/27/2014   Polycystic ovaries 07/24/2015   Past Surgical History:  Procedure Laterality Date   NO PAST SURGERIES     Family History  Problem Relation Age of Onset   Diabetes Mother    Hypertension Mother    Liver disease Neg Hx    Esophageal cancer Neg Hx    Colon cancer Neg Hx    Social History   Tobacco Use   Smoking status: Never   Smokeless tobacco: Never  Vaping Use   Vaping status: Never Used  Substance Use Topics   Alcohol use: No   Drug use: No   Current Outpatient Medications  Medication Sig Dispense Refill   docusate sodium (COLACE) 100 MG capsule Take 1 capsule (100 mg total) by mouth 2 (two) times daily. 60 capsule 5   hydrocortisone (ANUSOL-HC) 25 MG suppository Place 1 suppository (25 mg total) rectally 2 (two) times daily. 12 suppository 0   hydrocortisone-pramoxine (PROCTOFOAM-HC) rectal foam Apply rectally 2 (two) times daily. 10 g 3   polyethylene glycol powder (MIRALAX) 17 GM/SCOOP powder Mix 1 capful (17 g) with 4-8 ounces of water/beverage and take by mouth daily. 238 g 1   No current facility-administered  medications for this visit.   No Known Allergies  Physical Exam: BP (!) 140/100   Pulse 67   Ht 5\' 3"  (1.6 m)   Wt 180 lb (81.6 kg)   BMI 31.89 kg/m  Constitutional: Pleasant,well-developed, female in no acute distress. HEENT: Normocephalic and atraumatic. Conjunctivae are normal. No scleral icterus. Cardiovascular: Normal rate Pulmonary/chest: Effort normal Abdominal: Soft, nondistended, nontender. Extremities: No edema Neurological: Alert and oriented to person place and time. Skin: Skin is warm and dry. No rashes noted. Psychiatric: Normal mood and affect. Behavior is normal.  Labs 09/2020: TSH nml.   Labs 11/2020: CBC with elevated WBC of 12.5.   Labs 08/2023: CBC nml. CMP unremarkable.  ASSESSMENT AND PLAN: Rectal pain Rectal bleeding Hemorrhoids Constipation Patient presents to discuss her hemorrhoids, which did improve slightly on topical steroids.  I did offer the patient another course of Steroids to see if this helps further with her symptoms.  Patient would be interested in hemorrhoidal banding at this time.  I went over with the risks and benefits of hemorrhoidal banding, and she is interested in proceeding.  Her prior blood counts were normal, suggesting against substantial rectal bleeding - Use Sitz baths, daily fiber supplement, and avoid straining - Continue Miralax QD - Proceed with hemorrhoidal banding today -If hemorrhoidal banding does not help with her symptoms, would consider a colonoscopy in the future  Eulah Pont, MD  I spent  30 minutes of time, including in depth chart review, independent review of results as outlined above, communicating results with the patient directly, face-to-face time with the patient, coordinating care, and ordering studies and medications as appropriate, and documentation.  PROCEDURE NOTE: The patient presents with symptomatic grade 2  hemorrhoids, requesting rubber band ligation of his/her hemorrhoidal disease.  All risks,  benefits and alternative forms of therapy were described and informed consent was obtained.  In the Left Lateral Decubitus position anoscopic examination revealed grade 2 hemorrhoids. The anorectum was pre-medicated with nitroglycerin and Recticare The decision was made to band the right anterior internal hemorrhoid, and the [facility] System was used to perform band ligation without complication.  Digital anorectal examination was then performed to assure proper positioning of the band, and to adjust the banded tissue as required.  The patient was discharged home without pain or other issues.  Dietary and behavioral recommendations were given and along with follow-up instructions.    The patient will return in 4 weeks for  follow-up and possible additional banding as required. No complications were encountered and the patient tolerated the procedure well.

## 2023-09-20 NOTE — Patient Instructions (Addendum)
 You are schedule for a follow up visit on 10/20/23 at 11:10 am  HEMORRHOID BANDING PROCEDURE    FOLLOW-UP CARE   The procedure you have had should have been relatively painless since the banding of the area involved does not have nerve endings and there is no pain sensation.  The rubber band cuts off the blood supply to the hemorrhoid and the band may fall off as soon as 48 hours after the banding (the band may occasionally be seen in the toilet bowl following a bowel movement). You may notice a temporary feeling of fullness in the rectum which should respond adequately to plain Tylenol or Motrin.  Following the banding, avoid strenuous exercise that evening and resume full activity the next day.  A sitz bath (soaking in a warm tub) or bidet is soothing, and can be useful for cleansing the area after bowel movements.     To avoid constipation, take two tablespoons of natural wheat bran, natural oat bran, flax, Benefiber or any over the counter fiber supplement and increase your water intake to 7-8 glasses daily.    Unless you have been prescribed anorectal medication, do not put anything inside your rectum for two weeks: No suppositories, enemas, fingers, etc.  Occasionally, you may have more bleeding than usual after the banding procedure.  This is often from the untreated hemorrhoids rather than the treated one.  Don't be concerned if there is a tablespoon or so of blood.  If there is more blood than this, lie flat with your bottom higher than your head and apply an ice pack to the area. If the bleeding does not stop within a half an hour or if you feel faint, call our office at [phone] or go to the emergency room.  Problems are not common; however, if there is a substantial amount of bleeding, severe pain, chills, fever or difficulty passing urine (very rare) or other problems, you should call us at [phone] or report to the nearest emergency room.  Do not stay seated  continuously for more than 2-3 hours for a day or two after the procedure.  Tighten your buttock muscles 10-15 times every two hours and take 10-15 deep breaths every 1-2 hours.  Do not spend more than a few minutes on the toilet if you cannot empty your bowel; instead re-visit the toilet at a later time.   Thank you for entrusting me with your care and for choosing [facility], Dr. Eulah Pont

## 2023-10-11 ENCOUNTER — Ambulatory Visit: Payer: Medicaid Other | Attending: Family Medicine | Admitting: Family Medicine

## 2023-10-11 ENCOUNTER — Other Ambulatory Visit (HOSPITAL_COMMUNITY)
Admission: RE | Admit: 2023-10-11 | Discharge: 2023-10-11 | Disposition: A | Discharge status: Home / Self Care | Source: Ambulatory Visit | Attending: Family Medicine | Admitting: Family Medicine

## 2023-10-11 ENCOUNTER — Encounter: Payer: Self-pay | Admitting: Family Medicine

## 2023-10-11 VITALS — BP 118/80 | HR 72 | Ht 63.0 in | Wt 175.2 lb

## 2023-10-11 DIAGNOSIS — Z113 Encounter for screening for infections with a predominantly sexual mode of transmission: Secondary | ICD-10-CM

## 2023-10-11 DIAGNOSIS — Z124 Encounter for screening for malignant neoplasm of cervix: Secondary | ICD-10-CM | POA: Diagnosis not present

## 2023-10-11 DIAGNOSIS — Z0000 Encounter for general adult medical examination without abnormal findings: Secondary | ICD-10-CM | POA: Diagnosis not present

## 2023-10-11 NOTE — Patient Instructions (Signed)

## 2023-10-11 NOTE — Progress Notes (Signed)
 Subjective:  Patient ID: Megan [name], female    DOB: 10/02/1990  Age: 33 y.o. MRN: [id number]  CC: Gynecologic Exam     Discussed the use of AI scribe software for clinical note transcription with the patient, who gave verbal consent to proceed.  History of Present Illness The patient presents for an annual physical exam. She reports engaging in regular exercise twice daily and has made dietary changes to manage her slightly elevated cholesterol levels. She consumes a diet rich in fruits and vegetables. She has multiple dental cavities that need filling, but due to lack of insurance coverage, she has not been able to get them filled. She also reports dry skin, for which she is seeking recommendations for a suitable moisturizer.    Past Medical History:  Diagnosis Date   COVID-19 affecting pregnancy in second trimester 10/22/2020   08/01/20    Irregular menstrual cycle 03/02/2016   Medical history non-contributory    PAC (premature atrial contraction) 06/27/2014   Polycystic ovaries 07/24/2015    Past Surgical History:  Procedure Laterality Date   NO PAST SURGERIES      Family History  Problem Relation Age of Onset   Diabetes Mother    Hypertension Mother    Liver disease Neg Hx    Esophageal cancer Neg Hx    Colon cancer Neg Hx     Social History   Socioeconomic History   Marital status: Married    Spouse name: Not on file   Number of children: 2   Years of education: Not on file   Highest education level: Not on file  Occupational History   Occupation: house wife  Tobacco Use   Smoking status: Never   Smokeless tobacco: Never  Vaping Use   Vaping status: Never Used  Substance and Sexual Activity   Alcohol use: No   Drug use: No   Sexual activity: Yes    Birth control/protection: None  Other Topics Concern   Not on file  Social History Narrative   Nepalese   2 young boys    Social Drivers of Corporate investment banker Strain: Not on file  Food  Insecurity: No Food Insecurity (12/11/2020)   Hunger Vital Sign    Worried About Running Out of Food in the Last Year: Never true    Ran Out of Food in the Last Year: Never true  Transportation Needs: No Transportation Needs (12/11/2020)   PRAPARE - Administrator, Civil Service (Medical): No    Lack of Transportation (Non-Medical): No  Physical Activity: Not on file  Stress: Not on file  Social Connections: Not on file    No Known Allergies  Outpatient Medications Prior to Visit  Medication Sig Dispense Refill   docusate sodium (COLACE) 100 MG capsule Take 1 capsule (100 mg total) by mouth 2 (two) times daily. 60 capsule 5   hydrocortisone (ANUSOL-HC) 25 MG suppository Place 1 suppository (25 mg total) rectally 2 (two) times daily. 12 suppository 0   hydrocortisone-pramoxine (PROCTOFOAM-HC) rectal foam Apply rectally 2 (two) times daily. 10 g 3   polyethylene glycol powder (MIRALAX) 17 GM/SCOOP powder Mix 1 capful (17 g) with 4-8 ounces of water/beverage and take by mouth daily. 238 g 1   No facility-administered medications prior to visit.     ROS Review of Systems  Constitutional:  Negative for activity change and appetite change.  HENT:  Positive for dental problem. Negative for sinus pressure and sore throat.  Respiratory:  Negative for chest tightness, shortness of breath and wheezing.   Cardiovascular:  Negative for chest pain and palpitations.  Gastrointestinal:  Negative for abdominal distention, abdominal pain and constipation.  Genitourinary: Negative.   Musculoskeletal: Negative.   Psychiatric/Behavioral:  Negative for behavioral problems and dysphoric mood.     Objective:  BP 118/80   Pulse 72   Ht 5\' 3"  (1.6 m)   Wt 175 lb 3.2 oz (79.5 kg)   SpO2 98%   BMI 31.04 kg/m      10/11/2023   10:30 AM 09/20/2023    8:29 AM 08/30/2023    9:25 AM  BP/Weight  Systolic BP 118 140 130  Diastolic BP 80 100 88  Wt. (Lbs) 175.2 180 180  BMI 31.04 kg/m2  31.89 kg/m2 31.89 kg/m2      Physical Exam Exam conducted with a chaperone present.  Constitutional:      General: She is not in acute distress.    Appearance: She is well-developed. She is not diaphoretic.  HENT:     Head: Normocephalic.     Right Ear: External ear normal.     Left Ear: External ear normal.     Nose: Nose normal.  Eyes:     Conjunctiva/sclera: Conjunctivae normal.     Pupils: Pupils are equal, round, and reactive to light.  Neck:     Vascular: No JVD.  Cardiovascular:     Rate and Rhythm: Normal rate and regular rhythm.     Heart sounds: Normal heart sounds. No murmur heard.    No gallop.  Pulmonary:     Effort: Pulmonary effort is normal. No respiratory distress.     Breath sounds: Normal breath sounds. No wheezing or rales.  Chest:     Chest wall: No tenderness.  Breasts:    Right: Normal. No mass, nipple discharge or tenderness.     Left: Normal. No mass, nipple discharge or tenderness.  Abdominal:     General: Bowel sounds are normal. There is no distension.     Palpations: Abdomen is soft. There is no mass.     Tenderness: There is no abdominal tenderness.     Hernia: There is no hernia in the left inguinal area or right inguinal area.  Genitourinary:    General: Normal vulva.     Pubic Area: No rash.      Labia:        Right: No rash.        Left: No rash.      Vagina: Normal.     Cervix: Normal.     Uterus: Normal.      Adnexa: Right adnexa normal and left adnexa normal.       Right: No tenderness.         Left: No tenderness.    Musculoskeletal:        General: No tenderness. Normal range of motion.     Cervical back: Normal range of motion. No tenderness.  Lymphadenopathy:     Upper Body:     Right upper body: No supraclavicular or axillary adenopathy.     Left upper body: No supraclavicular or axillary adenopathy.  Skin:    General: Skin is warm and dry.  Neurological:     Mental Status: She is alert and oriented to person, place,  and time.     Deep Tendon Reflexes: Reflexes are normal and symmetric.        Latest Ref Rng & Units 08/30/2023  10:20 AM 08/20/2020   11:57 AM 06/22/2020    3:50 PM  CMP  Glucose 70 - 99 mg/dL 782  956  71   BUN 6 - 20 mg/dL 8  6  5    Creatinine 0.57 - 1.00 mg/dL 2.13  0.86  5.78   Sodium 134 - 144 mmol/L 144  137  138   Potassium 3.5 - 5.2 mmol/L 4.5  3.6  4.0   Chloride 96 - 106 mmol/L 104  106  102   CO2 20 - 29 mmol/L 16  19  22    Calcium 8.7 - 10.2 mg/dL 9.4  9.0  9.7   Total Protein 6.0 - 8.5 g/dL 8.0  6.6  7.6   Total Bilirubin 0.0 - 1.2 mg/dL <4.6  0.3  0.2   Alkaline Phos 44 - 121 IU/L 67  46  60   AST 0 - 40 IU/L 23  21  22    ALT 0 - 32 IU/L 21  11  16      Lipid Panel     Component Value Date/Time   CHOL 235 (H) 08/30/2023 1020   TRIG 128 08/30/2023 1020   HDL 55 08/30/2023 1020   CHOLHDL 4.2 08/17/2015 0948   VLDL 25 08/17/2015 0948   LDLCALC 157 (H) 08/30/2023 1020    CBC    Component Value Date/Time   WBC 10.6 08/30/2023 1020   WBC 9.3 05/30/2023 1021   RBC 5.27 08/30/2023 1020   RBC 5.20 (H) 05/30/2023 1021   HGB 14.5 08/30/2023 1020   HCT 45.6 08/30/2023 1020   PLT 406 08/30/2023 1020   MCV 87 08/30/2023 1020   MCH 27.5 08/30/2023 1020   MCH 28.0 12/13/2020 0446   MCHC 31.8 08/30/2023 1020   MCHC 32.5 05/30/2023 1021   RDW 12.7 08/30/2023 1020   LYMPHSABS 3.5 (H) 08/30/2023 1020   MONOABS 1.0 12/21/2013 2035   EOSABS 0.3 08/30/2023 1020   BASOSABS 0.1 08/30/2023 1020    Lab Results  Component Value Date   HGBA1C 6.0 (H) 08/30/2023       Assessment & Plan Annual Physical exam Routine female exam conducted. Lifestyle modifications for hyperlipidemia discussed. Management options for xerosis addressed. - Perform breast exam and Pap smear. - Advise use of petrolatum for xerosis. - Schedule next physical exam and blood test in one year.  Screening for cervical cancer -Pap smear performed  Screening for STDs -Cervicovaginal  ancillary testing performed      No orders of the defined types were placed in this encounter.   Follow-up: Return in about 1 year (around 10/10/2024) for CPE/ Preventive Health Exam.       Hoy Register, MD, FAAFP. [facility] [place], [place] [phone]   10/11/2023, 11:22 AM

## 2023-10-13 LAB — CERVICOVAGINAL ANCILLARY ONLY
Bacterial Vaginitis (gardnerella): NEGATIVE
Candida Glabrata: NEGATIVE
Candida Vaginitis: NEGATIVE
Chlamydia: NEGATIVE
Comment: NEGATIVE
Comment: NEGATIVE
Comment: NEGATIVE
Comment: NEGATIVE
Comment: NEGATIVE
Comment: NORMAL
Neisseria Gonorrhea: NEGATIVE
Trichomonas: NEGATIVE

## 2023-10-13 LAB — CYTOLOGY - PAP
Adequacy: ABSENT
Comment: NEGATIVE
Diagnosis: NEGATIVE
High risk HPV: NEGATIVE

## 2023-10-20 ENCOUNTER — Ambulatory Visit: Payer: Medicaid Other | Admitting: Internal Medicine

## 2023-10-20 ENCOUNTER — Encounter: Payer: Self-pay | Admitting: Internal Medicine

## 2023-10-20 VITALS — BP 104/70 | HR 72 | Ht 64.0 in | Wt 173.0 lb

## 2023-10-20 DIAGNOSIS — K641 Second degree hemorrhoids: Secondary | ICD-10-CM

## 2023-10-20 DIAGNOSIS — K649 Unspecified hemorrhoids: Secondary | ICD-10-CM

## 2023-10-20 NOTE — Progress Notes (Signed)
 PROCEDURE NOTE: The patient presents with symptomatic grade 2  hemorrhoids, requesting rubber band ligation of his/her hemorrhoidal disease.  All risks, benefits and alternative forms of therapy were described and informed consent was obtained.  Patient states that her rectal pain and swelling has improved.  In the Left Lateral Decubitus position anoscopic examination revealed grade 2 hemorrhoids The anorectum was pre-medicated with nitroglycerin and Recticare The decision was made to band the posterior internal hemorrhoid, and the [facility] O'Regan System was used to perform band ligation without complication.  Digital anorectal examination was then performed to assure proper positioning of the band, and to adjust the banded tissue as required.  The patient was discharged home without pain or other issues.  Dietary and behavioral recommendations were given and along with follow-up instructions.    The patient will return in 4 weeks for follow-up and possible additional banding as required. No complications were encountered and the patient tolerated the procedure well.

## 2023-10-20 NOTE — Patient Instructions (Signed)
 HEMORRHOID BANDING PROCEDURE    FOLLOW-UP CARE   The procedure you have had should have been relatively painless since the banding of the area involved does not have nerve endings and there is no pain sensation.  The rubber band cuts off the blood supply to the hemorrhoid and the band may fall off as soon as 48 hours after the banding (the band may occasionally be seen in the toilet bowl following a bowel movement). You may notice a temporary feeling of fullness in the rectum which should respond adequately to plain Tylenol or Motrin.  Following the banding, avoid strenuous exercise that evening and resume full activity the next day.  A sitz bath (soaking in a warm tub) or bidet is soothing, and can be useful for cleansing the area after bowel movements.     To avoid constipation, take two tablespoons of natural wheat bran, natural oat bran, flax, Benefiber or any over the counter fiber supplement and increase your water intake to 7-8 glasses daily.    Unless you have been prescribed anorectal medication, do not put anything inside your rectum for two weeks: No suppositories, enemas, fingers, etc.  Occasionally, you may have more bleeding than usual after the banding procedure.  This is often from the untreated hemorrhoids rather than the treated one.  Don't be concerned if there is a tablespoon or so of blood.  If there is more blood than this, lie flat with your bottom higher than your head and apply an ice pack to the area. If the bleeding does not stop within a half an hour or if you feel faint, call our office at [phone] or go to the emergency room.  Problems are not common; however, if there is a substantial amount of bleeding, severe pain, chills, fever or difficulty passing urine (very rare) or other problems, you should call us at [phone] or report to the nearest emergency room.  Do not stay seated continuously for more than 2-3 hours for a day or two after the procedure.   Tighten your buttock muscles 10-15 times every two hours and take 10-15 deep breaths every 1-2 hours.  Do not spend more than a few minutes on the toilet if you cannot empty your bowel; instead re-visit the toilet at a later time.   Thank you for entrusting me with your care and for choosing [facility],  Dr. Eulah Pont

## 2023-11-21 ENCOUNTER — Ambulatory Visit: Admitting: Internal Medicine

## 2023-11-21 ENCOUNTER — Encounter: Payer: Self-pay | Admitting: Internal Medicine

## 2023-11-21 VITALS — BP 110/84 | HR 80 | Ht 64.0 in | Wt 169.0 lb

## 2023-11-21 DIAGNOSIS — K649 Unspecified hemorrhoids: Secondary | ICD-10-CM

## 2023-11-21 DIAGNOSIS — K641 Second degree hemorrhoids: Secondary | ICD-10-CM | POA: Diagnosis not present

## 2023-11-21 NOTE — Progress Notes (Signed)
 PROCEDURE NOTE: The patient presents with symptomatic grade 2  hemorrhoids, requesting rubber band ligation of his/her hemorrhoidal disease.  All risks, benefits and alternative forms of therapy were described and informed consent was obtained.  In the Left Lateral Decubitus position anoscopic examination revealed grade 2 hemorrhoids The anorectum was pre-medicated with Recticare and nitroglycerin The decision was made to band the left anterior internal hemorrhoid, and the [facility] O'Regan System was used to perform band ligation without complication.  Digital anorectal examination was then performed to assure proper positioning of the band, and to adjust the banded tissue as required.  The patient was discharged home without pain or other issues.  Dietary and behavioral recommendations were given and along with follow-up instructions.    No complications were encountered and the patient tolerated the procedure well.

## 2023-11-21 NOTE — Patient Instructions (Signed)
 HEMORRHOID BANDING PROCEDURE    FOLLOW-UP CARE   The procedure you have had should have been relatively painless since the banding of the area involved does not have nerve endings and there is no pain sensation.  The rubber band cuts off the blood supply to the hemorrhoid and the band may fall off as soon as 48 hours after the banding (the band may occasionally be seen in the toilet bowl following a bowel movement). You may notice a temporary feeling of fullness in the rectum which should respond adequately to plain Tylenol or Motrin.  Following the banding, avoid strenuous exercise that evening and resume full activity the next day.  A sitz bath (soaking in a warm tub) or bidet is soothing, and can be useful for cleansing the area after bowel movements.     To avoid constipation, take two tablespoons of natural wheat bran, natural oat bran, flax, Benefiber or any over the counter fiber supplement and increase your water intake to 7-8 glasses daily.    Unless you have been prescribed anorectal medication, do not put anything inside your rectum for two weeks: No suppositories, enemas, fingers, etc.  Occasionally, you may have more bleeding than usual after the banding procedure.  This is often from the untreated hemorrhoids rather than the treated one.  Don't be concerned if there is a tablespoon or so of blood.  If there is more blood than this, lie flat with your bottom higher than your head and apply an ice pack to the area. If the bleeding does not stop within a half an hour or if you feel faint, call our office at [phone] or go to the emergency room.  Problems are not common; however, if there is a substantial amount of bleeding, severe pain, chills, fever or difficulty passing urine (very rare) or other problems, you should call us at [phone] or report to the nearest emergency room.  Do not stay seated continuously for more than 2-3 hours for a day or two after the procedure.   Tighten your buttock muscles 10-15 times every two hours and take 10-15 deep breaths every 1-2 hours.  Do not spend more than a few minutes on the toilet if you cannot empty your bowel; instead re-visit the toilet at a later time.   Thank you for entrusting me with your care and for choosing [facility],  Dr. Eulah Pont

## 2023-12-01 ENCOUNTER — Other Ambulatory Visit: Payer: Self-pay

## 2023-12-01 MED ORDER — COVID-19 MRNA VAC-TRIS(PFIZER) 30 MCG/0.3ML IM SUSY
0.3000 mL | PREFILLED_SYRINGE | Freq: Once | INTRAMUSCULAR | 0 refills | Status: AC
  Filled 2023-12-01: qty 0.3, 1d supply, fill #0

## 2023-12-26 ENCOUNTER — Other Ambulatory Visit: Payer: Self-pay | Admitting: Physician Assistant

## 2023-12-26 ENCOUNTER — Other Ambulatory Visit: Payer: Self-pay

## 2023-12-26 DIAGNOSIS — M5412 Radiculopathy, cervical region: Secondary | ICD-10-CM

## 2023-12-28 ENCOUNTER — Other Ambulatory Visit: Payer: Self-pay

## 2024-05-07 NOTE — Progress Notes (Unsigned)
 Ellouise Console, PA-C [address] [place], [place]  [address] Phone: [phone]   Primary Care Physician: Delbert Clam, MD  Primary Gastroenterologist:  Ellouise Console, PA-C /Dr. Federico  Chief Complaint: Follow-up hemorrhoids       HPI:   Megan [name] is a 33 y.o. female, established patient Dr. Federico, returns for follow-up of hemorrhoids.  She underwent internal hemorrhoid banding x 2 of grade 2 hemorrhoids by Dr. Federico 09/2023 and 10/2023.  She has chronic constipation.  She takes Colace stool softener and MiraLAX  daily.  She has used hydrocortisone  25 mg suppositories and Proctofoam  cream to treat hemorrhoids.  Hemorrhoids started after her pregnancy.  No previous colonoscopy.  Discussed the use of AI scribe software for clinical note transcription with the patient, who gave verbal consent to proceed.  History of Present Illness     Current Outpatient Medications  Medication Sig Dispense Refill   docusate sodium  (COLACE) 100 MG capsule Take 1 capsule (100 mg total) by mouth 2 (two) times daily. 60 capsule 5   hydrocortisone  (ANUSOL -HC) 25 MG suppository Place 1 suppository (25 mg total) rectally 2 (two) times daily. 12 suppository 0   hydrocortisone -pramoxine (PROCTOFOAM -HC) rectal foam Apply rectally 2 (two) times daily. (Patient taking differently: Place 1 applicator rectally 2 (two) times daily. As needed) 10 g 3   polyethylene glycol powder (MIRALAX ) 17 GM/SCOOP powder Mix 1 capful (17 g) with 4-8 ounces of water/beverage and take by mouth daily. 238 g 1   No current facility-administered medications for this visit.    Allergies as of 05/08/2024   (No Known Allergies)    Past Medical History:  Diagnosis Date   COVID-19 affecting pregnancy in second trimester 10/22/2020   08/01/20    Irregular menstrual cycle 03/02/2016   PAC (premature atrial contraction) 06/27/2014   Polycystic ovaries 07/24/2015    Past Surgical History:  Procedure Laterality  Date   HEMORRHOID BANDING      Review of Systems:    All systems reviewed and negative except where noted in HPI.    Physical Exam:  There were no vitals taken for this visit. No LMP recorded.  General: Well-nourished, well-developed in no acute distress.  Lungs: Clear to auscultation bilaterally. Non-labored. Heart: Regular rate and rhythm, no murmurs rubs or gallops.  Abdomen: Bowel sounds are normal; Abdomen is Soft; No hepatosplenomegaly, masses or hernias;  No Abdominal Tenderness; No guarding or rebound tenderness. Neuro: Alert and oriented x 3.  Grossly intact.  Psych: Alert and cooperative, normal mood and affect.   Imaging Studies: No results found.  Labs: CBC    Component Value Date/Time   WBC 10.6 08/30/2023 1020   WBC 9.3 05/30/2023 1021   RBC 5.27 08/30/2023 1020   RBC 5.20 (H) 05/30/2023 1021   HGB 14.5 08/30/2023 1020   HCT 45.6 08/30/2023 1020   PLT 406 08/30/2023 1020   MCV 87 08/30/2023 1020   MCH 27.5 08/30/2023 1020   MCH 28.0 12/13/2020 0446   MCHC 31.8 08/30/2023 1020   MCHC 32.5 05/30/2023 1021   RDW 12.7 08/30/2023 1020   LYMPHSABS 3.5 (H) 08/30/2023 1020   MONOABS 1.0 12/21/2013 2035   EOSABS 0.3 08/30/2023 1020   BASOSABS 0.1 08/30/2023 1020    CMP     Component Value Date/Time   NA 144 08/30/2023 1020   K 4.5 08/30/2023 1020   CL 104 08/30/2023 1020   CO2 16 (L) 08/30/2023 1020   GLUCOSE 113 (H) 08/30/2023  1020   GLUCOSE 136 (H) 08/20/2020 1157   BUN 8 08/30/2023 1020   CREATININE 0.64 08/30/2023 1020   CREATININE 0.52 07/09/2015 1131   CALCIUM 9.4 08/30/2023 1020   PROT 8.0 08/30/2023 1020   ALBUMIN 4.4 08/30/2023 1020   AST 23 08/30/2023 1020   ALT 21 08/30/2023 1020   ALKPHOS 67 08/30/2023 1020   BILITOT <0.2 08/30/2023 1020   GFRNONAA >60 08/20/2020 1157   GFRNONAA >89 07/09/2015 1131   GFRAA 150 06/22/2020 1550   GFRAA >89 07/09/2015 1131       Assessment and Plan:   Megan [name] is a 33 y.o. y/o female  ***    Ellouise Console, PA-C  Follow up ***

## 2024-05-08 ENCOUNTER — Encounter: Payer: Self-pay | Admitting: Physician Assistant

## 2024-05-08 ENCOUNTER — Ambulatory Visit: Admitting: Physician Assistant

## 2024-05-08 ENCOUNTER — Other Ambulatory Visit: Payer: Self-pay

## 2024-05-08 VITALS — BP 110/70 | HR 76 | Ht 62.25 in | Wt 160.4 lb

## 2024-05-08 DIAGNOSIS — K644 Residual hemorrhoidal skin tags: Secondary | ICD-10-CM | POA: Diagnosis not present

## 2024-05-08 DIAGNOSIS — K5904 Chronic idiopathic constipation: Secondary | ICD-10-CM

## 2024-05-08 DIAGNOSIS — K5909 Other constipation: Secondary | ICD-10-CM

## 2024-05-08 DIAGNOSIS — K649 Unspecified hemorrhoids: Secondary | ICD-10-CM

## 2024-05-08 DIAGNOSIS — K6289 Other specified diseases of anus and rectum: Secondary | ICD-10-CM

## 2024-05-08 MED ORDER — HYDROCORTISONE (PERIANAL) 2.5 % EX CREA
1.0000 | TOPICAL_CREAM | Freq: Two times a day (BID) | CUTANEOUS | 1 refills | Status: AC
  Filled 2024-05-08: qty 30, 15d supply, fill #0
  Filled 2024-07-11: qty 30, 15d supply, fill #1

## 2024-05-08 MED ORDER — SENNOSIDES-DOCUSATE SODIUM 8.6-50 MG PO TABS
2.0000 | ORAL_TABLET | Freq: Every day | ORAL | 5 refills | Status: AC
Start: 2024-05-08 — End: 2024-11-04
  Filled 2024-05-08: qty 60, 30d supply, fill #0
  Filled 2024-07-11 (×2): qty 60, 30d supply, fill #1

## 2024-05-08 NOTE — Patient Instructions (Addendum)
 For External Hemorrhoids: Sit in a Warm water sitz bath with epsom salt for 20 monutes 2 - 3 times per day for flare up of external hemorrhoids. Use OTC Preparation H, Tucks Pads, and Witch Hazel wipes as needed. I sent a prescription for Hydrocortisone  2.5% cream, Apply 2-3 times per day for hemorrhoid pain / swelling. You can also use OTC Recticare (Lidocaine ) cream for rectal pain. Discussed referral for Surgery as a last resort if Conservative treatment fails.  Please follow up sooner if symptoms increase or worsen  Due to recent changes in healthcare laws, you may see the results of your imaging and laboratory studies on MyChart before your provider has had a chance to review them.  We understand that in some cases there may be results that are confusing or concerning to you. Not all laboratory results come back in the same time frame and the provider may be waiting for multiple results in order to interpret others.  Please give us  48 hours in order for your provider to thoroughly review all the results before contacting the office for clarification of your results.   Thank you for trusting me with your gastrointestinal care!   Ellouise Console, PA-C _______________________________________________________  If your blood pressure at your visit was 140/90 or greater, please contact your primary care physician to follow up on this.  _______________________________________________________  If you are age 30 or older, your body mass index should be between 23-30. Your Body mass index is 29.1 kg/m. If this is out of the aforementioned range listed, please consider follow up with your Primary Care Provider.  If you are age 59 or younger, your body mass index should be between 19-25. Your Body mass index is 29.1 kg/m. If this is out of the aformentioned range listed, please consider follow up with your Primary Care Provider.   ________________________________________________________  The [facility]  [facility] providers would like to encourage you to use MYCHART to communicate with providers for non-urgent requests or questions.  Due to long hold times on the telephone, sending your provider a message by [facility] may be a faster and more efficient way to get a response.  Please allow 48 business hours for a response.  Please remember that this is for non-urgent requests.  _______________________________________________________

## 2024-05-10 ENCOUNTER — Other Ambulatory Visit: Payer: Self-pay

## 2024-05-28 ENCOUNTER — Other Ambulatory Visit: Payer: Self-pay

## 2024-06-23 NOTE — Progress Notes (Deleted)
 Ellouise Console, PA-C [address] [place], [place]  [address] Phone: [phone]   Primary Care Physician: Delbert Clam, MD  Primary Gastroenterologist:  Ellouise Console, PA-C /Dr. Federico  Chief Complaint: Follow-up hemorrhoids and constipation       HPI:   Discussed the use of AI scribe software for clinical note transcription with the patient, who gave verbal consent to proceed.  We are using [employer] interpreter.  Language is Burmese.  Interpreter:   She returns for 6-week follow-up of thrombosed external hemorrhoid and constipation.  Previously treated with hydrocortisone  2.5% cream, MiraLAX , and Senokot.   She underwent internal hemorrhoid banding x 2 of grade 2 hemorrhoids by Dr. Federico 09/2023 and 10/2023.  Patient's hemorrhoid symptoms improved after the banding procedures.   Hemorrhoids started after her pregnancy.  No previous colonoscopy.   History of Present Illness      Current Outpatient Medications  Medication Sig Dispense Refill   docusate sodium  (COLACE) 100 MG capsule Take 1 capsule (100 mg total) by mouth 2 (two) times daily. (Patient not taking: Reported on 05/08/2024) 60 capsule 5   hydrocortisone  (ANUSOL -HC) 2.5 % rectal cream Place 1 Application rectally 2 (two) times daily. 30 g 1   hydrocortisone  (ANUSOL -HC) 25 MG suppository Place 1 suppository (25 mg total) rectally 2 (two) times daily. (Patient not taking: Reported on 05/08/2024) 12 suppository 0   hydrocortisone -pramoxine (PROCTOFOAM -HC) rectal foam Apply rectally 2 (two) times daily. (Patient not taking: Reported on 05/08/2024) 10 g 3   polyethylene glycol powder (MIRALAX ) 17 GM/SCOOP powder Mix 1 capful (17 g) with 4-8 ounces of water/beverage and take by mouth daily. (Patient not taking: Reported on 05/08/2024) 238 g 1   senna-docusate (SENOKOT-S) 8.6-50 MG tablet Take 2 tablets by mouth at bedtime. 60 tablet 5   No current facility-administered medications for this visit.    Allergies  as of 06/24/2024   (No Known Allergies)    Past Medical History:  Diagnosis Date   COVID-19 affecting pregnancy in second trimester 10/22/2020   08/01/20    Irregular menstrual cycle 03/02/2016   PAC (premature atrial contraction) 06/27/2014   Polycystic ovaries 07/24/2015    Past Surgical History:  Procedure Laterality Date   HEMORRHOID BANDING      Review of Systems:    All systems reviewed and negative except where noted in HPI.    Physical Exam:  There were no vitals taken for this visit. No LMP recorded.  General: Well-nourished, well-developed in no acute distress.  Lungs: Clear to auscultation bilaterally. Non-labored. Heart: Regular rate and rhythm, no murmurs rubs or gallops.  Abdomen: Bowel sounds are normal; Abdomen is Soft; No hepatosplenomegaly, masses or hernias;  No Abdominal Tenderness; No guarding or rebound tenderness. Neuro: Alert and oriented x 3.  Grossly intact.  Psych: Alert and cooperative, normal mood and affect.   Imaging Studies: No results found.  Labs: CBC    Component Value Date/Time   WBC 10.6 08/30/2023 1020   WBC 9.3 05/30/2023 1021   RBC 5.27 08/30/2023 1020   RBC 5.20 (H) 05/30/2023 1021   HGB 14.5 08/30/2023 1020   HCT 45.6 08/30/2023 1020   PLT 406 08/30/2023 1020   MCV 87 08/30/2023 1020   MCH 27.5 08/30/2023 1020   MCH 28.0 12/13/2020 0446   MCHC 31.8 08/30/2023 1020   MCHC 32.5 05/30/2023 1021   RDW 12.7 08/30/2023 1020   LYMPHSABS 3.5 (H) 08/30/2023 1020   MONOABS 1.0 12/21/2013 2035  EOSABS 0.3 08/30/2023 1020   BASOSABS 0.1 08/30/2023 1020    CMP     Component Value Date/Time   NA 144 08/30/2023 1020   K 4.5 08/30/2023 1020   CL 104 08/30/2023 1020   CO2 16 (L) 08/30/2023 1020   GLUCOSE 113 (H) 08/30/2023 1020   GLUCOSE 136 (H) 08/20/2020 1157   BUN 8 08/30/2023 1020   CREATININE 0.64 08/30/2023 1020   CREATININE 0.52 07/09/2015 1131   CALCIUM 9.4 08/30/2023 1020   PROT 8.0 08/30/2023 1020   ALBUMIN  4.4 08/30/2023 1020   AST 23 08/30/2023 1020   ALT 21 08/30/2023 1020   ALKPHOS 67 08/30/2023 1020   BILITOT <0.2 08/30/2023 1020   GFRNONAA >60 08/20/2020 1157   GFRNONAA >89 07/09/2015 1131   GFRAA 150 06/22/2020 1550   GFRAA >89 07/09/2015 1131       Assessment and Plan:   Margret Legler is a 33 y.o. y/o female ***  Assessment and Plan Assessment & Plan       Ellouise Console, PA-C  Follow up ***

## 2024-06-24 ENCOUNTER — Ambulatory Visit: Admitting: Physician Assistant

## 2024-07-11 ENCOUNTER — Other Ambulatory Visit: Payer: Self-pay

## 2024-08-21 NOTE — Progress Notes (Unsigned)
 "     Megan Console, PA-C [address] [place], [place]  [address] Phone: [phone]   Primary Care Physician: Megan Clam, MD  Primary Gastroenterologist:  Megan Console, PA-C / Dr. Rosario [name]   Chief Complaint: Follow-up hemorrhoids; C/O Worsening Rectal Pain and Lower abdominal pain     HPI:   Discussed the use of AI scribe software for clinical note transcription with the patient, who gave verbal consent to proceed.  I last saw patient 04/2024 for flareup of large external hemorrhoid and chronic constipation.  She was treated with hydrocortisone  2.5% cream, Senokot-S 2 tablets nightly, and OTC MiraLAX  daily.  She underwent internal hemorrhoid banding x 2 of grade 2 hemorrhoids by Dr. Kidney 09/2023 and 10/2023.  Patient's hemorrhoid symptoms improved after the banding procedures.   No previous colonoscopy.  No family history of colon cancer.  No recent abdominal imaging.  She denies current pregnancy.  History of Present Illness Megan [name] is a 34 year old female with hemorrhoids who presents for evaluation of worsening rectal and lower abdominal pain.  Rectal Pain and Perianal Symptoms: - Persistent intermittent rectal pain for the past three months - Pain localized to the perianal area, constant in nature - Significant pain with each bowel movement, regardless of stool consistency - Pain worsens with prolonged sitting - Episodes of swelling and sensation of inflammation in the perianal region - Hydrocortisone  cream provides partial relief - No rectal bleeding or blood in the stool - No weight loss.  Lower Abdominal Pain: - Pain radiates from the perianal area to the lower abdomen - Described as hurts from the bottom and then around the lower abdomen - Pain is present with every bowel movement and worsens with sitting - She has having worsening daily right lower quadrant and left lower quadrant abdominal pain for 1 month.  Bowel Habits: - Daily, soft  bowel movements - No constipation    Current Outpatient Medications  Medication Sig Dispense Refill   AMBULATORY NON FORMULARY MEDICATION Medication Name: Diltiazem 2% mixed with Lidocaine  5% Sig: apply a pea size amount to rectum three times a day for 6-8 weeks 30 g 1   polyethylene glycol powder (MIRALAX ) 17 GM/SCOOP powder Mix 1 capful (17 g) with 4-8 ounces of water/beverage and take by mouth daily. 238 g 1   senna-docusate (SENOKOT-S) 8.6-50 MG tablet Take 2 tablets by mouth at bedtime. 60 tablet 5   docusate sodium  (COLACE) 100 MG capsule Take 1 capsule (100 mg total) by mouth 2 (two) times daily. (Patient not taking: Reported on 08/22/2024) 60 capsule 5   hydrocortisone  (ANUSOL -HC) 2.5 % rectal cream Place 1 Application rectally 2 (two) times daily. (Patient not taking: Reported on 08/22/2024) 30 g 1   hydrocortisone  (ANUSOL -HC) 25 MG suppository Place 1 suppository (25 mg total) rectally 2 (two) times daily. (Patient not taking: Reported on 08/22/2024) 12 suppository 0   hydrocortisone -pramoxine (PROCTOFOAM -HC) rectal foam Apply rectally 2 (two) times daily. (Patient not taking: Reported on 08/22/2024) 10 g 3   No current facility-administered medications for this visit.    Allergies as of 08/22/2024   (No Known Allergies)    Past Medical History:  Diagnosis Date   COVID-19 affecting pregnancy in second trimester 10/22/2020   08/01/20    Irregular menstrual cycle 03/02/2016   PAC (premature atrial contraction) 06/27/2014   Polycystic ovaries 07/24/2015    Past Surgical History:  Procedure Laterality Date   HEMORRHOID BANDING      Review of  Systems:    All systems reviewed and negative except where noted in HPI.    Physical Exam:  BP 104/70   Pulse 74   Ht 5' 2.25 (1.581 m)   Wt 170 lb (77.1 kg)   BMI 30.84 kg/m  No LMP recorded.  General: Well-nourished, well-developed in no acute distress.  Lungs: Clear to auscultation bilaterally. Non-labored. Heart: Regular  rate and rhythm, no murmurs rubs or gallops.  Abdomen: Bowel sounds are normal; Abdomen is Soft; No hepatosplenomegaly, masses or hernias; Moderate RLQ and LLQ abdominal Tenderness; there is no upper abdominal tenderness.  No guarding or rebound tenderness. Neuro: Alert and oriented x 3.  Grossly intact.  Psych: Alert and cooperative, normal mood and affect. Rectal: There is 1 mildly swollen external hemorrhoid, size of a pea, nonthrombosed, mildly tender.  No external perianal rashes.  Moderate tenderness at the anus suspicious for anal fissure.  Unable to do deep internal rectal exam due to moderate rectal tenderness and pain.  Chaperone for Exam:  Megan [name], CMA   Imaging Studies: No results found.  Labs: CBC    Component Value Date/Time   WBC 10.6 08/30/2023 1020   WBC 9.3 05/30/2023 1021   RBC 5.27 08/30/2023 1020   RBC 5.20 (H) 05/30/2023 1021   HGB 14.5 08/30/2023 1020   HCT 45.6 08/30/2023 1020   PLT 406 08/30/2023 1020   MCV 87 08/30/2023 1020   MCH 27.5 08/30/2023 1020   MCH 28.0 12/13/2020 0446   MCHC 31.8 08/30/2023 1020   MCHC 32.5 05/30/2023 1021   RDW 12.7 08/30/2023 1020   LYMPHSABS 3.5 (H) 08/30/2023 1020   MONOABS 1.0 12/21/2013 2035   EOSABS 0.3 08/30/2023 1020   BASOSABS 0.1 08/30/2023 1020    CMP     Component Value Date/Time   NA 144 08/30/2023 1020   K 4.5 08/30/2023 1020   CL 104 08/30/2023 1020   CO2 16 (L) 08/30/2023 1020   GLUCOSE 113 (H) 08/30/2023 1020   GLUCOSE 136 (H) 08/20/2020 1157   BUN 8 08/30/2023 1020   CREATININE 0.64 08/30/2023 1020   CREATININE 0.52 07/09/2015 1131   CALCIUM 9.4 08/30/2023 1020   PROT 8.0 08/30/2023 1020   ALBUMIN 4.4 08/30/2023 1020   AST 23 08/30/2023 1020   ALT 21 08/30/2023 1020   ALKPHOS 67 08/30/2023 1020   BILITOT <0.2 08/30/2023 1020   GFRNONAA >60 08/20/2020 1157   GFRNONAA >89 07/09/2015 1131   GFRAA 150 06/22/2020 1550   GFRAA >89 07/09/2015 1131     Assessment and Plan:   Megan [name] is a 34 y.o. y/o female returns for follow-up of:  1.  Hemorrhoids: External and internal Flareup of external hemorrhoid 04/2024 treated conservatively.  History of internal hemorrhoid banding 09/2023 and 10/2023.  1 small nonthrombosed external hemorrhoid on exam today.  Appears improved compared to 3 months ago.  2.  Rectal pain suspicious for anal fissure - Start Diltiazem/ Lidocaine  gel to [facility]. Apply a pea size amount 1/2 to 1 inch inside rectum 3-4 times daily x1 month. - Start OTC Colace stool softener once daily to prevent any hard stools. - Use fragrance free wet wipes instead of dry tissue. - Follow-up visit in 4 weeks to reevaluate symptoms. - If rectal symptoms persist, then I recommend colonoscopy for further evaluation.  3.  Bilateral lower abdominal pain with moderate tenderness on exam - Labs CBC, CMP, hCG - CT abdomen pelvis with contrast  4.  Chronic constipation:  She states she is not currently constipated. - Recommend she take OTC Colace stool softener 100 mg 1 tablet once daily. - Add OTC MiraLAX  if needed.   Megan Console, PA-C  Follow up in 4 weeks with TG.  Also follow-up based on lab and CT results.   "

## 2024-08-22 ENCOUNTER — Encounter: Payer: Self-pay | Admitting: Physician Assistant

## 2024-08-22 ENCOUNTER — Ambulatory Visit: Payer: Self-pay | Admitting: Physician Assistant

## 2024-08-22 ENCOUNTER — Other Ambulatory Visit

## 2024-08-22 ENCOUNTER — Ambulatory Visit: Admitting: Physician Assistant

## 2024-08-22 VITALS — BP 104/70 | HR 74 | Ht 62.25 in | Wt 170.0 lb

## 2024-08-22 DIAGNOSIS — K6289 Other specified diseases of anus and rectum: Secondary | ICD-10-CM | POA: Diagnosis not present

## 2024-08-22 DIAGNOSIS — R1031 Right lower quadrant pain: Secondary | ICD-10-CM

## 2024-08-22 DIAGNOSIS — K644 Residual hemorrhoidal skin tags: Secondary | ICD-10-CM | POA: Diagnosis not present

## 2024-08-22 DIAGNOSIS — R1032 Left lower quadrant pain: Secondary | ICD-10-CM

## 2024-08-22 DIAGNOSIS — K5909 Other constipation: Secondary | ICD-10-CM | POA: Diagnosis not present

## 2024-08-22 DIAGNOSIS — K648 Other hemorrhoids: Secondary | ICD-10-CM | POA: Diagnosis not present

## 2024-08-22 DIAGNOSIS — K649 Unspecified hemorrhoids: Secondary | ICD-10-CM

## 2024-08-22 LAB — CBC WITH DIFFERENTIAL/PLATELET
Basophils Absolute: 0.1 10*3/uL (ref 0.0–0.1)
Basophils Relative: 1 % (ref 0.0–3.0)
Eosinophils Absolute: 0.2 10*3/uL (ref 0.0–0.7)
Eosinophils Relative: 2.3 % (ref 0.0–5.0)
HCT: 42.2 % (ref 36.0–46.0)
Hemoglobin: 14.1 g/dL (ref 12.0–15.0)
Lymphocytes Relative: 32.4 % (ref 12.0–46.0)
Lymphs Abs: 3 10*3/uL (ref 0.7–4.0)
MCHC: 33.4 g/dL (ref 30.0–36.0)
MCV: 84.6 fl (ref 78.0–100.0)
Monocytes Absolute: 0.7 10*3/uL (ref 0.1–1.0)
Monocytes Relative: 7.1 % (ref 3.0–12.0)
Neutro Abs: 5.4 10*3/uL (ref 1.4–7.7)
Neutrophils Relative %: 57.2 % (ref 43.0–77.0)
Platelets: 294 10*3/uL (ref 150.0–400.0)
RBC: 4.99 Mil/uL (ref 3.87–5.11)
RDW: 13.8 % (ref 11.5–15.5)
WBC: 9.4 10*3/uL (ref 4.0–10.5)

## 2024-08-22 LAB — HCG, SERUM, QUALITATIVE: Preg, Serum: NEGATIVE

## 2024-08-22 LAB — COMPREHENSIVE METABOLIC PANEL WITH GFR
ALT: 27 U/L (ref 3–35)
AST: 24 U/L (ref 5–37)
Albumin: 4.4 g/dL (ref 3.5–5.2)
Alkaline Phosphatase: 46 U/L (ref 39–117)
BUN: 9 mg/dL (ref 6–23)
CO2: 26 meq/L (ref 19–32)
Calcium: 8.9 mg/dL (ref 8.4–10.5)
Chloride: 104 meq/L (ref 96–112)
Creatinine, Ser: 0.56 mg/dL (ref 0.40–1.20)
GFR: 121.05 mL/min
Glucose, Bld: 93 mg/dL (ref 70–99)
Potassium: 3.8 meq/L (ref 3.5–5.1)
Sodium: 136 meq/L (ref 135–145)
Total Bilirubin: 0.5 mg/dL (ref 0.2–1.2)
Total Protein: 8.1 g/dL (ref 6.0–8.3)

## 2024-08-22 MED ORDER — AMBULATORY NON FORMULARY MEDICATION: 1 refills | Status: AC

## 2024-08-22 NOTE — Progress Notes (Signed)
 I agree with the assessment and plan as outlined by Ms. Franz

## 2024-08-22 NOTE — Progress Notes (Signed)
 Patient needs language interpreter: Burmese. Call and notify patient labs are all normal: 1.  Negative pregnancy test. 2.  Normal CBC.  White count and hemoglobin are normal.  No evidence of infection or anemia.  3.  Normal CMP.  Glucose, kidney test, liver tests, and electrolytes are normal.  I recommend continue with plan for abdominal pelvic CT as scheduled. Ellouise Console, PA-C

## 2024-08-22 NOTE — Patient Instructions (Addendum)
" ° °  Colace Stool Softener Generic: Docusate Sodium  - 100mg  OR 50mg  Take 1 Capsule Once Daily for Constipation    Your provider has requested that you go to the basement level for lab work before leaving today. Press B on the elevator. The lab is located at the first door on the left as you exit the elevator.  Due to recent changes in healthcare laws, you may see the results of your imaging and laboratory studies on MyChart before your provider has had a chance to review them.  We understand that in some cases there may be results that are confusing or concerning to you. Not all laboratory results come back in the same time frame and the provider may be waiting for multiple results in order to interpret others.  Please give us  48 hours in order for your provider to thoroughly review all the results before contacting the office for clarification of your results.   Your provider has prescribed Diltiazem/Lidocaine  gel for you. Please follow the directions written on your prescription bottle or given to you specifically by your provider. Since this is a specialty medication and is not readily available at most local pharmacies, we have sent your prescription to:  [facility] information is below: Address: [address] [place], [place] [address]  [phone]  *Please DO NOT go directly from our office to pick up this medication! Give the pharmacy 1 day to process the prescription as this is compounded and takes time to make.   You have been scheduled for a CT scan of the abdomen and pelvis at [facility], 1st floor Radiology. You are scheduled on 08/30/2024 at 2:30pm. You should arrive at 12:30pm  prior to your appointment time for registration and prepping.   Please follow the written instructions below on the day of your exam:    You may take any medications as prescribed with a small amount of water, if necessary. If you take any of the following medications:  METFORMIN , GLUCOPHAGE , GLUCOVANCE, AVANDAMET, RIOMET , FORTAMET , ACTOPLUS MET, JANUMET, GLUMETZA  or METAGLIP, you MAY be asked to HOLD this medication 48 hours AFTER the exam.   The purpose of you drinking the oral contrast is to aid in the visualization of your intestinal tract. The contrast solution may cause some diarrhea. Depending on your individual set of symptoms, you may also receive an intravenous injection of x-ray contrast/dye.   If you have any questions regarding your exam or if you need to reschedule, you may call [facility] at [phone] between the hours of 8:00 am and 5:00 pm, Monday-Friday.   I appreciate the opportunity to care for you. Ellouise Console, PA    "

## 2024-08-30 ENCOUNTER — Ambulatory Visit (HOSPITAL_COMMUNITY): Admission: RE | Admit: 2024-08-30

## 2024-08-30 DIAGNOSIS — R1031 Right lower quadrant pain: Secondary | ICD-10-CM

## 2024-08-30 DIAGNOSIS — R1032 Left lower quadrant pain: Secondary | ICD-10-CM

## 2024-08-30 MED ORDER — IOHEXOL 300 MG/ML  SOLN
100.0000 mL | Freq: Once | INTRAMUSCULAR | Status: AC | PRN
  Administered 2024-08-30: 100 mL via INTRAVENOUS

## 2024-08-30 MED ORDER — IOHEXOL 9 MG/ML PO SOLN
500.0000 mL | ORAL | Status: AC
  Administered 2024-08-30 (×2): 500 mL via ORAL

## 2024-09-20 ENCOUNTER — Ambulatory Visit: Admitting: Physician Assistant

## 2024-10-16 ENCOUNTER — Ambulatory Visit: Admitting: Family Medicine
